# Patient Record
Sex: Female | Born: 1935 | Race: Black or African American | Hispanic: No | Smoking: Former smoker
Health system: Southern US, Community
[De-identification: ages and names within clinical notes are randomized; demographics above are authoritative.]

## PROBLEM LIST (undated history)

## (undated) DIAGNOSIS — R51 Headache: Secondary | ICD-10-CM

## (undated) DIAGNOSIS — I10 Essential (primary) hypertension: Secondary | ICD-10-CM

## (undated) DIAGNOSIS — K219 Gastro-esophageal reflux disease without esophagitis: Secondary | ICD-10-CM

## (undated) DIAGNOSIS — R42 Dizziness and giddiness: Secondary | ICD-10-CM

## (undated) DIAGNOSIS — I509 Heart failure, unspecified: Secondary | ICD-10-CM

## (undated) DIAGNOSIS — R739 Hyperglycemia, unspecified: Secondary | ICD-10-CM

## (undated) DIAGNOSIS — K279 Peptic ulcer, site unspecified, unspecified as acute or chronic, without hemorrhage or perforation: Secondary | ICD-10-CM

## (undated) DIAGNOSIS — R5383 Other fatigue: Secondary | ICD-10-CM

## (undated) DIAGNOSIS — N186 End stage renal disease: Secondary | ICD-10-CM

## (undated) DIAGNOSIS — R609 Edema, unspecified: Secondary | ICD-10-CM

## (undated) DIAGNOSIS — M1990 Unspecified osteoarthritis, unspecified site: Secondary | ICD-10-CM

## (undated) DIAGNOSIS — E119 Type 2 diabetes mellitus without complications: Secondary | ICD-10-CM

## (undated) DIAGNOSIS — Z992 Dependence on renal dialysis: Secondary | ICD-10-CM

## (undated) DIAGNOSIS — I480 Paroxysmal atrial fibrillation: Secondary | ICD-10-CM

## (undated) DIAGNOSIS — IMO0001 Reserved for inherently not codable concepts without codable children: Secondary | ICD-10-CM

## (undated) HISTORY — PX: ARTERIOVENOUS GRAFT PLACEMENT: SUR1029

## (undated) HISTORY — DX: Other fatigue: R53.83

## (undated) HISTORY — DX: Essential (primary) hypertension: I10

## (undated) HISTORY — DX: Gastro-esophageal reflux disease without esophagitis: K21.9

## (undated) HISTORY — DX: Heart failure, unspecified: I50.9

## (undated) HISTORY — DX: Unspecified osteoarthritis, unspecified site: M19.90

## (undated) HISTORY — PX: CHOLECYSTECTOMY: SHX55

## (undated) HISTORY — DX: Peptic ulcer, site unspecified, unspecified as acute or chronic, without hemorrhage or perforation: K27.9

## (undated) HISTORY — DX: Headache: R51

## (undated) HISTORY — DX: End stage renal disease: N18.6

## (undated) HISTORY — DX: Edema, unspecified: R60.9

## (undated) HISTORY — DX: Dizziness and giddiness: R42

## (undated) HISTORY — DX: Dependence on renal dialysis: Z99.2

## (undated) HISTORY — PX: OTHER SURGICAL HISTORY: SHX169

## (undated) HISTORY — PX: ABDOMINAL HYSTERECTOMY: SHX81

## (undated) HISTORY — DX: Reserved for inherently not codable concepts without codable children: IMO0001

## (undated) HISTORY — DX: Paroxysmal atrial fibrillation: I48.0

## (undated) HISTORY — PX: EYE SURGERY: SHX253

---

## 1997-11-13 ENCOUNTER — Ambulatory Visit (HOSPITAL_COMMUNITY): Admission: RE | Admit: 1997-11-13 | Discharge: 1997-11-13 | Payer: Self-pay | Admitting: Cardiology

## 1998-08-30 ENCOUNTER — Ambulatory Visit (HOSPITAL_COMMUNITY): Admission: RE | Admit: 1998-08-30 | Discharge: 1998-08-30 | Payer: Self-pay | Admitting: Cardiology

## 1998-09-06 ENCOUNTER — Encounter: Payer: Self-pay | Admitting: Cardiology

## 1998-09-06 ENCOUNTER — Ambulatory Visit (HOSPITAL_COMMUNITY): Admission: RE | Admit: 1998-09-06 | Discharge: 1998-09-06 | Payer: Self-pay | Admitting: Cardiology

## 1998-09-21 ENCOUNTER — Emergency Department (HOSPITAL_COMMUNITY): Admission: EM | Admit: 1998-09-21 | Discharge: 1998-09-21 | Payer: Self-pay | Admitting: Emergency Medicine

## 1998-09-24 ENCOUNTER — Ambulatory Visit (HOSPITAL_COMMUNITY): Admission: RE | Admit: 1998-09-24 | Discharge: 1998-09-24 | Payer: Self-pay | Admitting: Emergency Medicine

## 1999-03-23 ENCOUNTER — Encounter: Payer: Self-pay | Admitting: Emergency Medicine

## 1999-03-23 ENCOUNTER — Inpatient Hospital Stay (HOSPITAL_COMMUNITY): Admission: EM | Admit: 1999-03-23 | Discharge: 1999-03-27 | Payer: Self-pay | Admitting: Emergency Medicine

## 1999-03-26 ENCOUNTER — Encounter: Payer: Self-pay | Admitting: Cardiology

## 1999-04-02 ENCOUNTER — Encounter: Admission: RE | Admit: 1999-04-02 | Discharge: 1999-04-02 | Payer: Self-pay | Admitting: Cardiology

## 2003-08-22 ENCOUNTER — Ambulatory Visit (HOSPITAL_BASED_OUTPATIENT_CLINIC_OR_DEPARTMENT_OTHER): Admission: RE | Admit: 2003-08-22 | Discharge: 2003-08-22 | Payer: Self-pay | Admitting: Urology

## 2007-10-06 ENCOUNTER — Ambulatory Visit: Payer: Self-pay | Admitting: Cardiology

## 2007-10-06 ENCOUNTER — Ambulatory Visit: Payer: Self-pay | Admitting: Infectious Disease

## 2007-10-06 ENCOUNTER — Inpatient Hospital Stay (HOSPITAL_COMMUNITY): Admission: EM | Admit: 2007-10-06 | Discharge: 2007-10-07 | Payer: Self-pay | Admitting: Emergency Medicine

## 2007-10-07 ENCOUNTER — Encounter: Payer: Self-pay | Admitting: Infectious Disease

## 2007-10-18 ENCOUNTER — Ambulatory Visit: Payer: Self-pay

## 2008-12-21 ENCOUNTER — Inpatient Hospital Stay (HOSPITAL_COMMUNITY): Admission: EM | Admit: 2008-12-21 | Discharge: 2009-01-01 | Payer: Self-pay | Admitting: Nephrology

## 2008-12-21 ENCOUNTER — Ambulatory Visit: Payer: Self-pay | Admitting: Cardiovascular Disease

## 2008-12-24 ENCOUNTER — Ambulatory Visit: Payer: Self-pay | Admitting: Vascular Surgery

## 2008-12-25 ENCOUNTER — Encounter: Payer: Self-pay | Admitting: Family Medicine

## 2008-12-31 DIAGNOSIS — K219 Gastro-esophageal reflux disease without esophagitis: Secondary | ICD-10-CM

## 2008-12-31 DIAGNOSIS — I10 Essential (primary) hypertension: Secondary | ICD-10-CM

## 2008-12-31 DIAGNOSIS — Z9189 Other specified personal risk factors, not elsewhere classified: Secondary | ICD-10-CM | POA: Insufficient documentation

## 2008-12-31 DIAGNOSIS — J989 Respiratory disorder, unspecified: Secondary | ICD-10-CM | POA: Insufficient documentation

## 2008-12-31 DIAGNOSIS — K279 Peptic ulcer, site unspecified, unspecified as acute or chronic, without hemorrhage or perforation: Secondary | ICD-10-CM | POA: Insufficient documentation

## 2008-12-31 DIAGNOSIS — E782 Mixed hyperlipidemia: Secondary | ICD-10-CM | POA: Insufficient documentation

## 2008-12-31 DIAGNOSIS — N189 Chronic kidney disease, unspecified: Secondary | ICD-10-CM

## 2009-01-03 ENCOUNTER — Encounter: Payer: Self-pay | Admitting: Cardiovascular Disease

## 2009-01-03 LAB — CONVERTED CEMR LAB
POC INR: 6.9
Prothrombin Time: 67.4 s

## 2009-01-04 ENCOUNTER — Telehealth (INDEPENDENT_AMBULATORY_CARE_PROVIDER_SITE_OTHER): Payer: Self-pay | Admitting: *Deleted

## 2009-01-08 ENCOUNTER — Encounter: Payer: Self-pay | Admitting: Cardiology

## 2009-01-08 ENCOUNTER — Encounter (INDEPENDENT_AMBULATORY_CARE_PROVIDER_SITE_OTHER): Payer: Self-pay | Admitting: Pharmacist

## 2009-01-08 LAB — CONVERTED CEMR LAB: POC INR: 1.5

## 2009-01-16 ENCOUNTER — Ambulatory Visit (HOSPITAL_COMMUNITY): Admission: RE | Admit: 2009-01-16 | Discharge: 2009-01-16 | Payer: Self-pay | Admitting: Vascular Surgery

## 2009-01-17 ENCOUNTER — Encounter: Payer: Self-pay | Admitting: Internal Medicine

## 2009-01-17 ENCOUNTER — Encounter: Payer: Self-pay | Admitting: Cardiology

## 2009-03-20 ENCOUNTER — Inpatient Hospital Stay (HOSPITAL_COMMUNITY): Admission: EM | Admit: 2009-03-20 | Discharge: 2009-03-23 | Payer: Self-pay | Admitting: Emergency Medicine

## 2009-04-10 ENCOUNTER — Ambulatory Visit: Payer: Self-pay | Admitting: Vascular Surgery

## 2009-10-30 ENCOUNTER — Ambulatory Visit: Payer: Self-pay | Admitting: Vascular Surgery

## 2009-12-14 ENCOUNTER — Emergency Department (HOSPITAL_COMMUNITY): Admission: EM | Admit: 2009-12-14 | Discharge: 2009-12-14 | Payer: Self-pay | Admitting: Emergency Medicine

## 2009-12-18 ENCOUNTER — Ambulatory Visit (HOSPITAL_COMMUNITY): Admission: RE | Admit: 2009-12-18 | Discharge: 2009-12-18 | Payer: Self-pay | Admitting: Nephrology

## 2010-02-19 ENCOUNTER — Ambulatory Visit: Payer: Self-pay | Admitting: Vascular Surgery

## 2010-02-21 ENCOUNTER — Ambulatory Visit: Payer: Self-pay | Admitting: Vascular Surgery

## 2010-02-21 ENCOUNTER — Ambulatory Visit (HOSPITAL_COMMUNITY)
Admission: RE | Admit: 2010-02-21 | Discharge: 2010-02-21 | Payer: Self-pay | Source: Home / Self Care | Admitting: Vascular Surgery

## 2010-03-03 ENCOUNTER — Ambulatory Visit: Payer: Self-pay | Admitting: Surgery

## 2010-03-12 ENCOUNTER — Ambulatory Visit: Payer: Self-pay | Admitting: Vascular Surgery

## 2010-06-12 ENCOUNTER — Other Ambulatory Visit (HOSPITAL_COMMUNITY): Payer: Self-pay | Admitting: Nephrology

## 2010-06-12 DIAGNOSIS — N186 End stage renal disease: Secondary | ICD-10-CM

## 2010-06-13 ENCOUNTER — Ambulatory Visit (HOSPITAL_COMMUNITY)
Admission: RE | Admit: 2010-06-13 | Discharge: 2010-06-13 | Disposition: A | Discharge status: Home / Self Care | Payer: Medicare Other | Source: Ambulatory Visit | Attending: Nephrology | Admitting: Nephrology

## 2010-06-13 ENCOUNTER — Other Ambulatory Visit (HOSPITAL_COMMUNITY): Payer: Self-pay | Admitting: Nephrology

## 2010-06-13 DIAGNOSIS — Z8673 Personal history of transient ischemic attack (TIA), and cerebral infarction without residual deficits: Secondary | ICD-10-CM | POA: Insufficient documentation

## 2010-06-13 DIAGNOSIS — K219 Gastro-esophageal reflux disease without esophagitis: Secondary | ICD-10-CM | POA: Insufficient documentation

## 2010-06-13 DIAGNOSIS — N186 End stage renal disease: Secondary | ICD-10-CM

## 2010-06-13 DIAGNOSIS — M109 Gout, unspecified: Secondary | ICD-10-CM | POA: Insufficient documentation

## 2010-06-13 DIAGNOSIS — I739 Peripheral vascular disease, unspecified: Secondary | ICD-10-CM | POA: Insufficient documentation

## 2010-06-13 DIAGNOSIS — T82898A Other specified complication of vascular prosthetic devices, implants and grafts, initial encounter: Secondary | ICD-10-CM | POA: Insufficient documentation

## 2010-06-13 DIAGNOSIS — D649 Anemia, unspecified: Secondary | ICD-10-CM | POA: Insufficient documentation

## 2010-06-13 DIAGNOSIS — I120 Hypertensive chronic kidney disease with stage 5 chronic kidney disease or end stage renal disease: Secondary | ICD-10-CM | POA: Insufficient documentation

## 2010-06-13 DIAGNOSIS — E785 Hyperlipidemia, unspecified: Secondary | ICD-10-CM | POA: Insufficient documentation

## 2010-06-13 DIAGNOSIS — Y832 Surgical operation with anastomosis, bypass or graft as the cause of abnormal reaction of the patient, or of later complication, without mention of misadventure at the time of the procedure: Secondary | ICD-10-CM | POA: Insufficient documentation

## 2010-06-13 MED ORDER — IOHEXOL 300 MG/ML  SOLN
60.0000 mL | Freq: Once | INTRAMUSCULAR | Status: AC | PRN
  Administered 2010-06-13: 60 mL via INTRAVENOUS

## 2010-06-26 DIAGNOSIS — R519 Headache, unspecified: Secondary | ICD-10-CM

## 2010-06-26 HISTORY — DX: Headache, unspecified: R51.9

## 2010-07-09 LAB — GLUCOSE, CAPILLARY
Glucose-Capillary: 114 mg/dL — ABNORMAL HIGH (ref 70–99)
Glucose-Capillary: 60 mg/dL — ABNORMAL LOW (ref 70–99)

## 2010-07-09 LAB — SURGICAL PCR SCREEN
MRSA, PCR: NEGATIVE
Staphylococcus aureus: NEGATIVE

## 2010-07-11 LAB — BASIC METABOLIC PANEL
Calcium: 8.8 mg/dL (ref 8.4–10.5)
Creatinine, Ser: 11.57 mg/dL — ABNORMAL HIGH (ref 0.4–1.2)
GFR calc Af Amer: 4 mL/min — ABNORMAL LOW (ref >60)

## 2010-07-11 LAB — CBC
MCH: 28.8 pg (ref 26.0–34.0)
Platelets: 153 10*3/uL (ref 150–400)
RBC: 3.89 MIL/uL (ref 3.87–5.11)
RDW: 16.4 % — ABNORMAL HIGH (ref 11.5–15.5)
WBC: 5.8 10*3/uL (ref 4.0–10.5)

## 2010-07-11 LAB — GLUCOSE, CAPILLARY: Glucose-Capillary: 63 mg/dL — ABNORMAL LOW (ref 70–99)

## 2010-07-11 LAB — PROTIME-INR: Prothrombin Time: 14.9 seconds (ref 11.6–15.2)

## 2010-07-24 ENCOUNTER — Encounter: Payer: Self-pay | Admitting: Cardiovascular Disease

## 2010-07-24 ENCOUNTER — Ambulatory Visit (INDEPENDENT_AMBULATORY_CARE_PROVIDER_SITE_OTHER): Payer: Medicare Other | Admitting: Cardiovascular Disease

## 2010-07-24 DIAGNOSIS — R609 Edema, unspecified: Secondary | ICD-10-CM

## 2010-07-24 DIAGNOSIS — R42 Dizziness and giddiness: Secondary | ICD-10-CM

## 2010-07-24 DIAGNOSIS — I480 Paroxysmal atrial fibrillation: Secondary | ICD-10-CM

## 2010-07-24 DIAGNOSIS — I4891 Unspecified atrial fibrillation: Secondary | ICD-10-CM

## 2010-07-24 DIAGNOSIS — R0789 Other chest pain: Secondary | ICD-10-CM

## 2010-07-24 DIAGNOSIS — R079 Chest pain, unspecified: Secondary | ICD-10-CM

## 2010-07-24 HISTORY — DX: Dizziness and giddiness: R42

## 2010-07-24 HISTORY — DX: Edema, unspecified: R60.9

## 2010-07-24 NOTE — Patient Instructions (Signed)
You are scheduled for a Lexiscan at [facility] on Wednesday 07/30/10 at 8am.  Need to arrive at 7:30am to be registered.  You are to not eat or drink anything after midnight prior to the appointment.  Take your medications with you to the appointment.

## 2010-07-24 NOTE — Assessment & Plan Note (Signed)
She is currently in normal sinus rhythm. It seems that overall A-fib burden is low. She used to be on Warfarin before but was deemed to be not a good long term candidate due to memory problems and recurrent falls. Will continue with Aspirin and Plavix for now.

## 2010-07-24 NOTE — Assessment & Plan Note (Signed)
Overall, her symptoms are atypical for angina but does carry some concerning features. It seems to get worse with activities but mainly when she is using the vacuum cleaner which suggest a possible musculoskeletal etiology. Nonetheless, due to her multiple risk factors for CAD, will request a Lexiscan nuclear stress test. She is not able to exercise on treadmill.

## 2010-07-24 NOTE — Progress Notes (Signed)
HPI  [name] is a new patient to me. She is referred by Dr. Marina Goodell to evaluate her chest pain. She was seen in the past by [facility] for chest pain with negative stress testing at that time. She also has history of paroxysmal A-fib. She used to be on Warfarin but was taken off due to memory problems and falls.  She has been on dialysis for 2 years. She has history of recurrent atypical chest pain. She also reports previous history of heart failure. She went to [facility] ER due to chest pain pain which was left sided and described as aching without radiation. Mostly at rest but it did get worse with activities as well. No dyspnea or other associated symptoms although she does have fatigue especially on days of dialysis.   Allergies  Allergen Reactions  . Biaxin   . Penicillins   . Sulfa Antibiotics      No current outpatient prescriptions on file prior to visit.     Past Medical History  Diagnosis Date  . Chest pain 07/07/10  . Hypertension   . Edema 07/24/10    of ankles  . Generalized headaches 03/12  . Lightheadedness 07/24/10  . Reflux   . Fatigue   . Arthritis   . Gout   . AF (paroxysmal atrial fibrillation)     Uses to be on Warfarin in the past but was taken off due to falls.   . CHF (congestive heart failure)     Normal EF.   Marland Kitchen Chronic kidney disease   . End stage renal disease on dialysis since 2010  . Diabetes mellitus     Borderline  . Peptic ulcer disease      No past surgical history on file.   Family History  Problem Relation Age of Onset  . Cancer Father 64    cancer     History   Social History  . Marital Status: Widowed    Spouse Name: N/A    Number of Children: N/A  . Years of Education: N/A   Occupational History  . Not on file.   Social History Main Topics  . Smoking status: Former Smoker    Types: Cigarettes    Quit date: 04/28/1979  . Smokeless tobacco: Not on file  . Alcohol Use: No  . Drug Use: No  . Sexually Active:     Other Topics Concern  . Not on file   Social History Narrative  . No narrative on file     ROS Constitutional: Negative for fever, chills, diaphoresis, activity change, appetite change. Positive for fatigue.  HENT: Negative for hearing loss, nosebleeds, congestion, sore throat, facial swelling, drooling, trouble swallowing, neck pain, voice change, sinus pressure and tinnitus.  Eyes: Negative for photophobia, pain, discharge and visual disturbance.  Respiratory: Negative for apnea, cough, chest tightness, shortness of breath and wheezing.  Cardiovascular: Negative for palpitations and leg swelling.  Gastrointestinal: Negative for nausea, vomiting, abdominal pain, diarrhea, constipation, blood in stool and abdominal distention.  Genitourinary: Negative for dysuria, urgency, frequency, hematuria and decreased urine volume.  Musculoskeletal: Negative for myalgias, back pain, joint swelling, arthralgias and gait problem.  Skin: Negative for color change, pallor, rash and wound.  Neurological: Negative for dizziness, tremors, seizures, syncope, speech difficulty, weakness, light-headedness, numbness and headaches.  Psychiatric/Behavioral: Negative for suicidal ideas, hallucinations, behavioral problems and agitation. The patient is not nervous/anxious.     PHYSICAL EXAM   BP 132/79  Pulse 77  Wt 158 lb 9.6  oz (71.94 kg)  SpO2 91%   Constitutional: He is oriented to person, place, and time. He appears well-developed and well-nourished. No distress.  HENT:  Head: Normocephalic and atraumatic.  Eyes: Pupils are equal, round, and reactive to light. Right eye exhibits no discharge. Left eye exhibits no discharge.  Neck: Normal range of motion. Neck supple. No JVD present. No thyromegaly present.  Cardiovascular: Normal rate, regular rhythm, normal heart sounds and intact distal pulses. Exam reveals no gallop and no friction rub.  2/6 SEM at aortic area with preserved S2 and early  peaking.  Pulmonary/Chest: Effort normal and breath sounds normal. No stridor. No respiratory distress. He has no wheezes. He has no rales. He exhibits no tenderness.  Abdominal: Soft. Bowel sounds are normal. He exhibits no distension. There is no tenderness. There is no rebound and no guarding.  Musculoskeletal: Normal range of motion. He exhibits no edema and no tenderness.  Neurological: He is alert and oriented to person, place, and time. Coordination normal.  Skin: Skin is warm and dry. No rash noted. He is not diaphoretic. No erythema. No pallor.  Psychiatric: He has a normal mood and affect. His behavior is normal. Judgment and thought content normal.       EKG: NSR, LAD, mildly prolonged QT.    ASSESSMENT AND PLAN

## 2010-07-30 LAB — URINE MICROSCOPIC-ADD ON

## 2010-07-30 LAB — DIFFERENTIAL
Basophils Absolute: 0 10*3/uL (ref 0.0–0.1)
Eosinophils Absolute: 0.1 10*3/uL (ref 0.0–0.7)
Eosinophils Relative: 2 % (ref 0–5)
Lymphocytes Relative: 16 % (ref 12–46)
Lymphocytes Relative: 21 % (ref 12–46)
Lymphs Abs: 1.2 10*3/uL (ref 0.7–4.0)
Lymphs Abs: 1.7 10*3/uL (ref 0.7–4.0)
Monocytes Absolute: 0.8 10*3/uL (ref 0.1–1.0)
Monocytes Absolute: 1 10*3/uL (ref 0.1–1.0)
Monocytes Relative: 13 % — ABNORMAL HIGH (ref 3–12)
Monocytes Relative: 9 % (ref 3–12)
Neutro Abs: 7.9 10*3/uL — ABNORMAL HIGH (ref 1.7–7.7)

## 2010-07-30 LAB — BASIC METABOLIC PANEL
BUN: 19 mg/dL (ref 6–23)
BUN: 50 mg/dL — ABNORMAL HIGH (ref 6–23)
Calcium: 8.2 mg/dL — ABNORMAL LOW (ref 8.4–10.5)
Chloride: 101 mEq/L (ref 96–112)
Chloride: 96 mEq/L (ref 96–112)
Creatinine, Ser: 3.15 mg/dL — ABNORMAL HIGH (ref 0.4–1.2)
GFR calc Af Amer: 17 mL/min — ABNORMAL LOW (ref >60)
GFR calc Af Amer: 8 mL/min — ABNORMAL LOW (ref >60)
GFR calc non Af Amer: 14 mL/min — ABNORMAL LOW (ref >60)
Potassium: 3.3 mEq/L — ABNORMAL LOW (ref 3.5–5.1)
Sodium: 138 mEq/L (ref 135–145)

## 2010-07-30 LAB — CULTURE, BLOOD (ROUTINE X 2): Culture: NO GROWTH

## 2010-07-30 LAB — CBC
HCT: 28.5 % — ABNORMAL LOW (ref 36.0–46.0)
HCT: 30.9 % — ABNORMAL LOW (ref 36.0–46.0)
Hemoglobin: 10.3 g/dL — ABNORMAL LOW (ref 12.0–15.0)
MCHC: 34 g/dL (ref 30.0–36.0)
MCV: 87.7 fL (ref 78.0–100.0)
MCV: 88.3 fL (ref 78.0–100.0)
MCV: 89.6 fL (ref 78.0–100.0)
Platelets: 158 10*3/uL (ref 150–400)
Platelets: 173 10*3/uL (ref 150–400)
RBC: 3.45 MIL/uL — ABNORMAL LOW (ref 3.87–5.11)
RBC: 3.54 MIL/uL — ABNORMAL LOW (ref 3.87–5.11)
RDW: 16.4 % — ABNORMAL HIGH (ref 11.5–15.5)
WBC: 5.9 10*3/uL (ref 4.0–10.5)
WBC: 7.2 10*3/uL (ref 4.0–10.5)

## 2010-07-30 LAB — HEPATIC FUNCTION PANEL
ALT: 8 U/L (ref 0–35)
Alkaline Phosphatase: 73 U/L (ref 39–117)
Bilirubin, Direct: 0.2 mg/dL (ref 0.0–0.3)
Indirect Bilirubin: 0.4 mg/dL (ref 0.3–0.9)
Total Protein: 6.2 g/dL (ref 6.0–8.3)

## 2010-07-30 LAB — COMPREHENSIVE METABOLIC PANEL
AST: 34 U/L (ref 0–37)
Albumin: 2.6 g/dL — ABNORMAL LOW (ref 3.5–5.2)
BUN: 54 mg/dL — ABNORMAL HIGH (ref 6–23)
Calcium: 8.1 mg/dL — ABNORMAL LOW (ref 8.4–10.5)
Creatinine, Ser: 5.66 mg/dL — ABNORMAL HIGH (ref 0.4–1.2)
GFR calc Af Amer: 9 mL/min — ABNORMAL LOW (ref >60)
Total Bilirubin: 0.4 mg/dL (ref 0.3–1.2)
Total Protein: 5.6 g/dL — ABNORMAL LOW (ref 6.0–8.3)

## 2010-07-30 LAB — URINALYSIS, ROUTINE W REFLEX MICROSCOPIC
Ketones, ur: NEGATIVE mg/dL
Protein, ur: 30 mg/dL — AB
Urobilinogen, UA: 0.2 mg/dL (ref 0.0–1.0)

## 2010-07-30 LAB — URINE CULTURE
Colony Count: NO GROWTH
Culture: NO GROWTH

## 2010-07-30 LAB — POCT CARDIAC MARKERS
CKMB, poc: 3 ng/mL (ref 1.0–8.0)
Myoglobin, poc: 190 ng/mL (ref 12–200)

## 2010-07-30 LAB — VITAMIN B12: Vitamin B-12: 536 pg/mL (ref 211–911)

## 2010-07-30 LAB — RPR: RPR Ser Ql: NONREACTIVE

## 2010-07-30 LAB — GLUCOSE, CAPILLARY: Glucose-Capillary: 82 mg/dL (ref 70–99)

## 2010-07-30 LAB — POTASSIUM: Potassium: 3.8 mEq/L (ref 3.5–5.1)

## 2010-08-01 LAB — CBC
HCT: 32.3 % — ABNORMAL LOW (ref 36.0–46.0)
HCT: 34 % — ABNORMAL LOW (ref 36.0–46.0)
MCHC: 32.7 g/dL (ref 30.0–36.0)
MCHC: 33 g/dL (ref 30.0–36.0)
MCHC: 33.8 g/dL (ref 30.0–36.0)
MCV: 87.4 fL (ref 78.0–100.0)
MCV: 87.6 fL (ref 78.0–100.0)
MCV: 87.7 fL (ref 78.0–100.0)
MCV: 88.7 fL (ref 78.0–100.0)
Platelets: 159 10*3/uL (ref 150–400)
Platelets: 171 10*3/uL (ref 150–400)
RBC: 3.7 MIL/uL — ABNORMAL LOW (ref 3.87–5.11)
RBC: 3.88 MIL/uL (ref 3.87–5.11)
RBC: 4.17 MIL/uL (ref 3.87–5.11)
RDW: 17.1 % — ABNORMAL HIGH (ref 11.5–15.5)
RDW: 17.3 % — ABNORMAL HIGH (ref 11.5–15.5)
RDW: 18.1 % — ABNORMAL HIGH (ref 11.5–15.5)
WBC: 6.6 10*3/uL (ref 4.0–10.5)
WBC: 7.7 10*3/uL (ref 4.0–10.5)

## 2010-08-01 LAB — GLUCOSE, CAPILLARY
Glucose-Capillary: 105 mg/dL — ABNORMAL HIGH (ref 70–99)
Glucose-Capillary: 108 mg/dL — ABNORMAL HIGH (ref 70–99)
Glucose-Capillary: 112 mg/dL — ABNORMAL HIGH (ref 70–99)
Glucose-Capillary: 118 mg/dL — ABNORMAL HIGH (ref 70–99)
Glucose-Capillary: 123 mg/dL — ABNORMAL HIGH (ref 70–99)
Glucose-Capillary: 126 mg/dL — ABNORMAL HIGH (ref 70–99)
Glucose-Capillary: 128 mg/dL — ABNORMAL HIGH (ref 70–99)
Glucose-Capillary: 135 mg/dL — ABNORMAL HIGH (ref 70–99)
Glucose-Capillary: 139 mg/dL — ABNORMAL HIGH (ref 70–99)
Glucose-Capillary: 158 mg/dL — ABNORMAL HIGH (ref 70–99)
Glucose-Capillary: 165 mg/dL — ABNORMAL HIGH (ref 70–99)
Glucose-Capillary: 207 mg/dL — ABNORMAL HIGH (ref 70–99)
Glucose-Capillary: 224 mg/dL — ABNORMAL HIGH (ref 70–99)
Glucose-Capillary: 226 mg/dL — ABNORMAL HIGH (ref 70–99)

## 2010-08-01 LAB — RENAL FUNCTION PANEL
Albumin: 2 g/dL — ABNORMAL LOW (ref 3.5–5.2)
Albumin: 2.6 g/dL — ABNORMAL LOW (ref 3.5–5.2)
Albumin: 2.6 g/dL — ABNORMAL LOW (ref 3.5–5.2)
BUN: 18 mg/dL (ref 6–23)
BUN: 20 mg/dL (ref 6–23)
BUN: 51 mg/dL — ABNORMAL HIGH (ref 6–23)
CO2: 21 mEq/L (ref 19–32)
Calcium: 9.2 mg/dL (ref 8.4–10.5)
Chloride: 100 mEq/L (ref 96–112)
Chloride: 101 mEq/L (ref 96–112)
Chloride: 112 mEq/L (ref 96–112)
Chloride: 98 mEq/L (ref 96–112)
Creatinine, Ser: 3.32 mg/dL — ABNORMAL HIGH (ref 0.4–1.2)
Creatinine, Ser: 4.32 mg/dL — ABNORMAL HIGH (ref 0.4–1.2)
Creatinine, Ser: 6.76 mg/dL — ABNORMAL HIGH (ref 0.4–1.2)
GFR calc Af Amer: 16 mL/min — ABNORMAL LOW (ref >60)
GFR calc non Af Amer: 13 mL/min — ABNORMAL LOW (ref >60)
GFR calc non Af Amer: 6 mL/min — ABNORMAL LOW (ref >60)
Glucose, Bld: 120 mg/dL — ABNORMAL HIGH (ref 70–99)
Glucose, Bld: 141 mg/dL — ABNORMAL HIGH (ref 70–99)
Glucose, Bld: 66 mg/dL — ABNORMAL LOW (ref 70–99)
Phosphorus: 2.9 mg/dL (ref 2.3–4.6)
Phosphorus: 4.4 mg/dL (ref 2.3–4.6)
Potassium: 2.4 mEq/L — CL (ref 3.5–5.1)
Potassium: 3.1 mEq/L — ABNORMAL LOW (ref 3.5–5.1)
Potassium: 3.2 mEq/L — ABNORMAL LOW (ref 3.5–5.1)
Potassium: 3.5 mEq/L (ref 3.5–5.1)
Sodium: 134 mEq/L — ABNORMAL LOW (ref 135–145)

## 2010-08-01 LAB — HEMOCCULT GUIAC POC 1CARD (OFFICE): Fecal Occult Bld: NEGATIVE

## 2010-08-01 LAB — IRON AND TIBC
Iron: 44 ug/dL (ref 42–135)
Iron: 49 ug/dL (ref 42–135)

## 2010-08-01 LAB — PROTIME-INR
Prothrombin Time: 14.9 seconds (ref 11.6–15.2)
Prothrombin Time: 16.1 seconds — ABNORMAL HIGH (ref 11.6–15.2)
Prothrombin Time: 16.8 seconds — ABNORMAL HIGH (ref 11.6–15.2)

## 2010-08-01 LAB — POCT I-STAT 4, (NA,K, GLUC, HGB,HCT)
Glucose, Bld: 95 mg/dL (ref 70–99)
Sodium: 138 mEq/L (ref 135–145)

## 2010-08-02 LAB — BASIC METABOLIC PANEL
BUN: 29 mg/dL — ABNORMAL HIGH (ref 6–23)
Chloride: 100 mEq/L (ref 96–112)
Glucose, Bld: 90 mg/dL (ref 70–99)
Potassium: 4 mEq/L (ref 3.5–5.1)
Sodium: 136 mEq/L (ref 135–145)

## 2010-08-02 LAB — GLUCOSE, CAPILLARY
Glucose-Capillary: 108 mg/dL — ABNORMAL HIGH (ref 70–99)
Glucose-Capillary: 111 mg/dL — ABNORMAL HIGH (ref 70–99)
Glucose-Capillary: 113 mg/dL — ABNORMAL HIGH (ref 70–99)
Glucose-Capillary: 126 mg/dL — ABNORMAL HIGH (ref 70–99)
Glucose-Capillary: 132 mg/dL — ABNORMAL HIGH (ref 70–99)
Glucose-Capillary: 81 mg/dL (ref 70–99)
Glucose-Capillary: 98 mg/dL (ref 70–99)

## 2010-08-02 LAB — RENAL FUNCTION PANEL
BUN: 47 mg/dL — ABNORMAL HIGH (ref 6–23)
BUN: 53 mg/dL — ABNORMAL HIGH (ref 6–23)
BUN: 59 mg/dL — ABNORMAL HIGH (ref 6–23)
CO2: 17 mEq/L — ABNORMAL LOW (ref 19–32)
CO2: 18 mEq/L — ABNORMAL LOW (ref 19–32)
Chloride: 104 mEq/L (ref 96–112)
Chloride: 99 mEq/L (ref 96–112)
Creatinine, Ser: 9.34 mg/dL — ABNORMAL HIGH (ref 0.4–1.2)
Creatinine, Ser: 9.36 mg/dL — ABNORMAL HIGH (ref 0.4–1.2)
GFR calc Af Amer: 5 mL/min — ABNORMAL LOW (ref >60)
Glucose, Bld: 93 mg/dL (ref 70–99)
Glucose, Bld: 98 mg/dL (ref 70–99)
Phosphorus: 5 mg/dL — ABNORMAL HIGH (ref 2.3–4.6)
Phosphorus: 6.1 mg/dL — ABNORMAL HIGH (ref 2.3–4.6)
Potassium: 2.8 mEq/L — ABNORMAL LOW (ref 3.5–5.1)
Potassium: 2.8 mEq/L — ABNORMAL LOW (ref 3.5–5.1)
Potassium: 3.5 mEq/L (ref 3.5–5.1)

## 2010-08-02 LAB — CBC
HCT: 33.3 % — ABNORMAL LOW (ref 36.0–46.0)
HCT: 36 % (ref 36.0–46.0)
HCT: 38.8 % (ref 36.0–46.0)
Hemoglobin: 11.3 g/dL — ABNORMAL LOW (ref 12.0–15.0)
Hemoglobin: 12.1 g/dL (ref 12.0–15.0)
Hemoglobin: 13 g/dL (ref 12.0–15.0)
MCHC: 33.8 g/dL (ref 30.0–36.0)
MCV: 86.1 fL (ref 78.0–100.0)
MCV: 86.1 fL (ref 78.0–100.0)
MCV: 86.6 fL (ref 78.0–100.0)
Platelets: 226 10*3/uL (ref 150–400)
Platelets: 243 10*3/uL (ref 150–400)
RBC: 3.84 MIL/uL — ABNORMAL LOW (ref 3.87–5.11)
RBC: 4.18 MIL/uL (ref 3.87–5.11)
RDW: 16.8 % — ABNORMAL HIGH (ref 11.5–15.5)
RDW: 17.2 % — ABNORMAL HIGH (ref 11.5–15.5)
WBC: 6.9 10*3/uL (ref 4.0–10.5)
WBC: 7.7 10*3/uL (ref 4.0–10.5)

## 2010-08-02 LAB — HEPATITIS B SURFACE ANTIGEN: Hepatitis B Surface Ag: NEGATIVE

## 2010-08-02 LAB — URINALYSIS, ROUTINE W REFLEX MICROSCOPIC
Glucose, UA: NEGATIVE mg/dL
Nitrite: NEGATIVE
Protein, ur: NEGATIVE mg/dL
Urobilinogen, UA: 0.2 mg/dL (ref 0.0–1.0)

## 2010-08-02 LAB — URINE MICROSCOPIC-ADD ON

## 2010-08-02 LAB — ABO/RH: ABO/RH(D): B NEG

## 2010-08-02 LAB — TYPE AND SCREEN: Antibody Screen: NEGATIVE

## 2010-08-02 LAB — URIC ACID: Uric Acid, Serum: 8.4 mg/dL — ABNORMAL HIGH (ref 2.4–7.0)

## 2010-08-04 ENCOUNTER — Encounter: Payer: Self-pay | Admitting: Cardiovascular Disease

## 2010-08-04 ENCOUNTER — Ambulatory Visit (INDEPENDENT_AMBULATORY_CARE_PROVIDER_SITE_OTHER): Payer: Medicare Other | Admitting: Cardiovascular Disease

## 2010-08-04 DIAGNOSIS — R0789 Other chest pain: Secondary | ICD-10-CM

## 2010-08-04 DIAGNOSIS — I4891 Unspecified atrial fibrillation: Secondary | ICD-10-CM

## 2010-08-04 DIAGNOSIS — I480 Paroxysmal atrial fibrillation: Secondary | ICD-10-CM | POA: Insufficient documentation

## 2010-08-04 NOTE — Assessment & Plan Note (Signed)
Her chest pain resolved. A Lexiscan nuclear stress test showed no evidence of ischemia with normal LVSF. Her symptoms were likely GI in nature.

## 2010-08-04 NOTE — Progress Notes (Signed)
HPI  [name] is an 77 y/o female who is here for a follow up visit. She was seen recently regarding atypical chest pain. She had no more episodes since last visit. She feels better overall and has more energy.   Allergies  Allergen Reactions  . Biaxin   . Penicillins   . Sulfa Antibiotics      Current Outpatient Prescriptions on File Prior to Visit  Medication Sig Dispense Refill  . allopurinol (ZYLOPRIM) 100 MG tablet Take 100 mg by mouth daily.        Marland Kitchen aspirin 81 MG tablet Take 81 mg by mouth daily.        Marland Kitchen b complex-vitamin c-folic acid (NEPHRO-VITE) 0.8 MG TABS Take 0.8 mg by mouth at bedtime.        . calcium acetate (PHOSLO) 667 MG capsule Take 667 mg by mouth 3 (three) times daily with meals.        . clopidogrel (PLAVIX) 75 MG tablet Take 75 mg by mouth daily.        Marland Kitchen loratadine (CLARITIN) 10 MG tablet Take 10 mg by mouth daily.        . Multiple Vitamin (MULTIVITAMIN) capsule Take 1 capsule by mouth daily.        . pantoprazole (PROTONIX) 40 MG tablet Take 40 mg by mouth daily.        Marland Kitchen pyridOXINE (VITAMIN B-6) 100 MG tablet Take 100 mg by mouth daily.        . ranitidine (ZANTAC) 150 MG tablet Take 150 mg by mouth at bedtime.        . sevelamer (RENVELA) 800 MG tablet Take 800 mg by mouth 3 (three) times daily with meals.           Past Medical History  Diagnosis Date  . Chest pain 07/07/10  . Hypertension   . Edema 07/24/10    of ankles  . Generalized headaches 03/12  . Lightheadedness 07/24/10  . Reflux   . Fatigue   . Arthritis   . Gout   . Diabetes mellitus     Borderline  . Peptic ulcer disease   . AF (paroxysmal atrial fibrillation)     Uses to be on Warfarin in the past but was taken off due to falls.   . CHF (congestive heart failure)     Normal EF.   Marland Kitchen Chronic kidney disease   . End stage renal disease on dialysis since 2010     No past surgical history on file.   Family History  Problem Relation Age of Onset  . Cancer Father 34   cancer     History   Social History  . Marital Status: Widowed    Spouse Name: N/A    Number of Children: N/A  . Years of Education: N/A   Occupational History  . Not on file.   Social History Main Topics  . Smoking status: Former Smoker    Types: Cigarettes    Quit date: 04/28/1979  . Smokeless tobacco: Not on file  . Alcohol Use: No  . Drug Use: No  . Sexually Active:    Other Topics Concern  . Not on file   Social History Narrative  . No narrative on file       PHYSICAL EXAM   BP 125/70  Pulse 73  Wt 159 lb 9.6 oz (72.394 kg)  SpO2 99%  Constitutional: She is oriented to person, place, and time. She appears well-developed and  well-nourished. No distress.  HENT: No nasal discharge.  Head: Normocephalic and atraumatic.  Eyes: Pupils are equal, round, and reactive to light. Right eye exhibits no discharge. Left eye exhibits no discharge.  Neck: Normal range of motion. Neck supple. No JVD present. No thyromegaly present.  Cardiovascular: Normal rate, regular rhythm, normal heart sounds and intact distal pulses. Exam reveals no gallop and no friction rub.  There is a 1/6 SEM in aortic area. .  Pulmonary/Chest: Effort normal and breath sounds normal. No stridor. No respiratory distress. She has no wheezes. She has no rales. She exhibits no tenderness.  Abdominal: Soft. Bowel sounds are normal. She exhibits no distension. There is no tenderness. There is no rebound and no guarding.  Musculoskeletal: Normal range of motion. She exhibits no edema and no tenderness.  Neurological: She is alert and oriented to person, place, and time. Coordination normal.  Skin: Skin is warm and dry. No rash noted. She is not diaphoretic. No erythema. No pallor.  Psychiatric: She has a normal mood and affect. Her behavior is normal. Judgment and thought content normal.      ASSESSMENT AND PLAN

## 2010-08-04 NOTE — Assessment & Plan Note (Signed)
She is currently in normal sinus rhythm. It seems that overall A-fib burden is low. She used to be on Warfarin before but was deemed to be not a good long term candidate due to memory problems and recurrent falls. Will continue with Aspirin and Plavix for now.   

## 2010-09-04 ENCOUNTER — Encounter: Payer: Self-pay | Admitting: Cardiovascular Disease

## 2010-09-09 NOTE — Discharge Summary (Signed)
[name], [name]             ACCOUNT NO.:  [id number]   MEDICAL RECORD NO.:  [id number]          PATIENT TYPE:  INP   LOCATION:  4705                         FACILITY:  [facility]   PHYSICIAN:  Acey Lav, MD  DATE OF BIRTH:  10-26-1927   DATE OF ADMISSION:  10/06/2007  DATE OF DISCHARGE:  10/07/2007                               DISCHARGE SUMMARY   DISCHARGE DIAGNOSES:  1. Atypical chest pain.  2. Hypertension.  3. Hyperlipidemia.  4. Hyperglycemia.  5. Renal insufficiency.  6. History of cerebrovascular accident.  7. Chronic headaches.  8. History of gout.  9. History of gastritis.   DISCHARGE MEDICATIONS:  1. Aspirin 81 mg p.o. daily.  2. Colchicine 0.6 mg p.o. daily.  3. Calcium 600 mg two tabs p.o. daily.  4. Trileptal 300 mg half a tab p.o. b.i.d.  5. Enalapril 5 mg two tabs p.o. daily.  6. Amlodipine - valsartan combination pill at 5-320 one tablet p.o.      daily.  7. Omeprazole 20 mg p.o. daily.  8. Potassium 10 mEq p.o. daily.  9. Lasix 20 mg 1/2 a tab p.o. daily.  10.Prilosec 20 mg two tabs p.o. daily.   DISPOSITION AND FOLLOW UP:  The patient is to follow up with Dr.  Jens Som in cardiology on November 04, 2007, at 10:00 a.m. to discuss the  stress test.  That is going to be done on November 12, 2007, at 7:30 a.m.  The patient is also to follow up with Dr. Tomasa Blase, her primary care  physician, in 1-2 weeks after discharge.   CONSULTATIONS:  Cardiology was consulted secondary to the patient's  chest pain and did not felt like her chest pain was cardiac in etiology,  but given her multiple risk factors Myoview was scheduled for her in the  outpatient setting.   PROCEDURES:  No procedures were performed during this hospitalization.   HISTORY AND PHYSICAL:  The patient is a 77 year old female with a past  medical history of hypertension, hyperlipidemia, and stroke presenting  with left-sided chest pain that radiated to her right arm.  It was sharp  and  stabbing in nature that started while she was ironing the afternoon  of admission.  It was relieved with aspirin and nitroglycerin given to  her by EMS.  She had associated nausea, shortness of breath, and dyspnea  with the chest pain.  No travel history or sick contacts or pleuritic  component.  She does not have any bronchitis over the last several  months prior to admission, but this has got better recently.  She is  also complaining of left-sided headache and jaw pain that she has had  over the last 2 years.   ADMISSION VITALS:  Temperature 97.8, blood pressure 141/74, pulse 78,  respiratory rate 22, and O2 sat 98% on room air.   ADMISSION PHYSICAL EXAM:  GENERAL:  She is alert and oriented x3 in no  distress, very pleasant.  EYES:  On exam, bilateral arcus analysis.  Pupils equally round and  reactive to light.  Extraocular motions intact and EOMI.  ENT EXAM:  Moist mucous membranes.  NECK EXAM:  No lymphadenopathy, no JVD, or carotid bruits.  RESPIRATORY EXAM:  Clear to auscultation bilaterally.  Normal  respiratory effort.  Left chest wall next to sternum, very tender to  palpation.  CARDIOVASCULAR EXAM:  Regular rate and rhythm.  No murmurs, rubs, or  gallops.  ABDOMINAL EXAM:  Good bowel sounds, soft, nontender, and nondistended.  EXTREMITY EXAM:  A 1+ pitting edema bilaterally.  A 2+ dorsalis pedis  pulses.  Her right lower extremity fusion was slightly larger than her  left and no calf tenderness.  MUSCULOSKELETAL EXAM:  Strength appropriate in all extremities.  NEURO EXAM:  Cranial nerves II through XII intact.   ADMISSION LABS:  CBC white count 6.0, hemoglobin 12.1, MCV 82.9, and  platelet count 148,000.  Sodium 139, potassium 3.4, chloride 108, bicarb  23, BUN 14, creatinine 1.8, glucose 97, and BNP 297.  PT and INR of  14.1/1.1 and PTT 27.  Liver function tests:  Total bilirubin 0.8,  alkaline phosphatase 104, AST 33, ALT 24, total protein 627, albumin  3.7, and  calcium 9.7.  Cardiac enzymes all within normal limits x3.  Sed  rate 10, hemoglobin A1c 6.2, and magnesium 1.9.  Lipid profile:  Total  cholesterol 199, LDL 135, HDL 51, and TSH 0.978.  Urinalysis with trace  leukocytes and rare bacteria with few squamous epithelial cells and 0-2  WBCs.   DIAGNOSTIC IMAGING:  EKG normal sinus rhythm with a left axis deviation,  otherwise within normal limits.  Chest x-ray:  Cardiomegaly without  acute disease.  A 2-D echo showed an ejection fraction between 55% and  65% and no left ventricular regional wall motion abnormalities.  The  left ventricular wall thickness was mildly increased.  There was trivial  aortic valve repair urge and mild mitral valve repair urge.  The left  atrium was mildly dilated and the estimated peak pulmonary artery  systolic pressure was mildly increased.   HOSPITAL COURSE:  1. Chest pain.  The patient did not have any recurrent episodes of      chest pain during this hospitalization.  Cardiac enzymes were      cycles and were within normal limits.  The patient was monitored on      telemetry and did not have any events on the monitor.  Serial EKGs      were done and no significant changes.  Cardiology was consulted      secondary to the patient's multiple risk factors and plans were      made for her to have a Myoview of an outpatient.  The patient's      pain may be secondary to costochondritis given the reproducibility      of the pain on exam or may be secondary to GI etiologies and she      was instructed on how to appropriately take her Prilosec at home      daily.  2. Hypertension.  The patient's home regimen was continued.  Her blood      pressures remained stable, systolics in 130s-140s during this      hospitalization.  3. Hyperlipidemia.  Her LDL was elevated at 135; however, she was not      started on statin therapy because she noted she had multiple      medication allergies, but could not stay with these  medicines and      it was felt like it was safe to defer this to her  primary care      physician in 1-2 weeks.  4. Hyperglycemia.  Hemoglobin A1c was checked and her A1c was slightly      elevated at 6.2.  She does not have any history of diabetes and her      blood sugars on her BMET were all less than 120.  It was felt like      this could be safely followed up in the outpatient setting.  5. Renal insufficiency.  The patient's creatinine on admission was 1.8      and decreased to 1.19 at the time of discharge this shows gentle IV      fluid hydration.  It was felt like this was likely prerenal.  6. Chronic headaches.  Because the patient complained of left-sided      headaches and jaw symptoms, a sed rate was checked and was within      normal limits, so it was felt like temporal arteritis would be      unlikely in this situation and she has an appointment to follow up      with her neurologist 2 days after discharge.   DISCHARGE VITALS:  Temperature 97.1, blood pressure 153/85, pulse 66,  respiratory rate 20, and O2 sat 100% on room air.   DISCHARGE LABS:  Sodium 141, potassium 3.7, chloride 104, bicarb 29, BUN  14, creatinine 1.19, glucose 100, and calcium 9.1.  CBC:  White count  5.0, hemoglobin 1.7, and platelet count 146,000.      Joaquin Courts, MD  Electronically Signed      Acey Lav, MD  Electronically Signed    VW/MEDQ  D:  10/12/2007  T:  10/13/2007  Job:  [id number]

## 2010-09-09 NOTE — Procedures (Signed)
CEPHALIC VEIN MAPPING   INDICATION:  AV fistula placement.   HISTORY:  Renal failure.   EXAM:  The right cephalic and basilic veins are compressible.   Diameter measurements range from 0.19 to 0.10 in the cephalic vein and  0.28 to 0.11 in the basilic vein.   See attached worksheet for all measurements.   IMPRESSION:  Patent right cephalic and basilic veins with diameters as  noted above.   ___________________________________________  Janetta Hora. Fields, MD   NT/MEDQ  D:  10/30/2009  T:  10/30/2009  Job:  [id number]

## 2010-09-09 NOTE — Assessment & Plan Note (Signed)
OFFICE VISIT   [name], [name]  DOB:  09-03-1927                                       03/12/2010  UXLKG#:[id number]   I saw the patient in the office today.  The patient had a new left  forearm graft placed on 02/21/2010.  She was seen in the office on  03/03/2010 because of swelling in the arm.  She comes in for 1-week  followup visit.  The swelling has improved significantly.  Her only  complaint is some mild paresthesias in the left hand when she is on  dialysis via her catheter.   On examination, the graft has a good thrill.  Incisions have healed  nicely.  She has a diminished radial pulse on the left.  She has a  radial and palmar arch signal with the Doppler.  I think she may have a  mild steal.  I have encouraged her to exercise her hand and to let us  know if her symptoms do not resolve.     Di Kindle. Edilia Bo, M.D.  Electronically Signed   CSD/MEDQ  D:  03/12/2010  T:  03/13/2010  Job:  [id number]

## 2010-09-09 NOTE — Assessment & Plan Note (Signed)
OFFICE VISIT   [name], [name]  DOB:  10-Nov-1927                                       03/03/2010  ZOXWR#:[id number]   Patient had a left forearm Gore-Tex graft placed on 02/21/2010 by Dr.  Edilia Bo.  She has been complaining of pain and swelling in her arm and  was brought in today for evaluation.   On examination, there is no evidence of infection within the graft.  The  arm is slightly more edematous than the right.  The incisions are all  well healed.   I do not see any obvious reason for concern with this graft.  She could  have some venous stenosis, which I would not intervene on until the  graft has been in place for a month.  The patient already has an  appointment to see Dr. Edilia Bo in 1 week, and he will reevaluate it in 1  week's time and make decisions as to whether or not to proceed with  graft study.  The patient does state that her swelling is better today  than it was yesterday.  I have encouraged her to keep her arm elevated,  which should help with the swelling.     Jorge Ny, MD  Electronically Signed   VWB/MEDQ  D:  03/03/2010  T:  03/03/2010  Job:  [id number]

## 2010-09-09 NOTE — Assessment & Plan Note (Signed)
OFFICE VISIT   [name], [name]  DOB:  Aug 15, 1927                                       10/30/2009  ZOXWR#:[id number]   HISTORY OF PRESENT ILLNESS:  The patient is a 77 year old female who  previously had a left upper arm fistula placed in September of 2010.  She apparently had a stent placed in the fistula by Dr. Deanne Coffer  recently.  She returns today for further followup with poor flows to the  fistula.   The patient and her daughter were quite frustrated today that they state  that she has been having multiple procedures on her arm and they had no  idea that hemodialysis involved multiple access procedures over time.  I  had a lengthy discussion with them today that unfortunately the nature  of hemodialysis is sometimes it can take multiple procedures even in the  best of circumstances.  I reviewed her venogram today from her recent  international procedure.  This shows a patent proximal fistula.  However, she had a severe narrowing of the cephalic vein at the junction  with the subclavian vein, a several centimeter along stent was placed  across this.  The vein is severely degenerated overall.  I do not  believe that this fistula is going to be a good long-term access for  her.  It cannot be revised past the level of the stent.  In light of  this her upper extremities were reviewed for possibility of placing a  new access.  Right upper extremity has 2+ brachial and radial pulse.  The left upper extremity has a pulsatile fistula.  Blood pressure in the  right arm is 110/70 with a heart rate of 85, oxygen saturation 98%.   She had a vein mapping ultrasound today which I read, ordered and  interpreted.  This showed no usable vein for basilic vein transposition  or brachiocephalic AV fistula.   She does not take any warfarin or Coumadin anticoagulation.  She is on  Plavix.   I had a lengthy discussion with the family regarding a new access due to  the fact that the left upper arm access is going to be failing soon.  Options included placing a left arm AV graft and a temporary dialysis  catheter or the other option of placing a right upper arm AV graft with  the hopes that this would be ready for use before the left arm fistula  fails avoiding the catheter.  However, at this time the patient and her  family wish to consider whether or not peritoneal dialysis may be an  option.  They understand that the graft on the left side may occlude in  the near future and that she would require a catheter at least in the  interim period until it could be decided whether or not hemodialysis or  peritoneal dialysis was going to be her choice in the future.  They have  decided to schedule an appointment with Dr. Hyman Hopes to discuss all these  issues with him.  They will call if they wish to have a right upper arm  graft placed in the near future.  If the left arm AV fistula occludes in  the near future prior to placing a new access then we would reconsider  what her options might be.     Janetta Hora.  Fields, MD  Electronically Signed   CEF/MEDQ  D:  10/30/2009  T:  10/31/2009  Job:  [id number]

## 2010-09-09 NOTE — Assessment & Plan Note (Signed)
OFFICE VISIT   TREENA, COSMAN  DOB:  [birth year]                                       04/10/2009  [id number]#:[id number]   The patient is a 77 year old female who underwent placement of a left  brachiocephalic AV fistula by Dr. Edilia Bo on 01/16/2009.  She presented  to the office today to ask whether or not the fistula is ready for  cannulation.  She is currently dialyzing via right-sided catheter.  She  has had no problems with this.   Apparently she had some numbness and tingling in her left hand initially  after placement of her fistula but this resolved over several weeks.  She currently has no numbness, tingling or aching in her left hand.   On physical exam there is an easily palpable thrill and audible bruit in  the fistula.  The fistula is quite prominent proximally but deeper  distally.  Overall though I think the fistula is ready for cannulation  at this point.  She will follow up on an as-needed basis if they have  difficulty with the fistula.     Janetta Hora. Fields, MD  Electronically Signed   CEF/MEDQ  D:  04/10/2009  T:  04/11/2009  Job:  [id number]   cc:   [facility]

## 2010-09-09 NOTE — Op Note (Signed)
NAMEMarland Kitchen  NAIJAH, LACEK NO.:  [id number]   MEDICAL RECORD NO.:  [id number]          PATIENT TYPE:  INP   LOCATION:  6731                         FACILITY:  [facility]   PHYSICIAN:  Quita Skye. Hart Rochester, M.D.  DATE OF BIRTH:  1927-05-28   DATE OF PROCEDURE:  12/24/2008  DATE OF DISCHARGE:                               OPERATIVE REPORT   PREOPERATIVE DIAGNOSIS:  End-stage renal disease.   POSTOPERATIVE DIAGNOSIS:  End-stage renal disease.   OPERATION:  1. Bilateral ultrasound localization, internal jugular veins.  2. Insertion of Palindrome hemodialysis catheter via right internal      jugular vein (19 cm).   SURGEON:  Quita Skye. Hart Rochester, MD.   FIRST ASSISTANT:  Nurse.   ANESTHESIA:  Local.   PROCEDURE:  The patient taken to the operating room and placed in the  supine position at which time upper chest and neck were exposed, both  internal jugular veins were imaged using B-mode ultrasound.  Both noted  to be widely patent with good flow and both were photographed.  After  prepping and draping in routine sterile manner, right internal jugular  vein was entered using a supraclavicular approach.  Guidewire passed  into the right atrium under fluoroscopic guidance.  The tract was  dilated over the guidewire and a 19 cm Palindrome catheter passed  through a peel-away sheath positioned in the right atrium, tunneled  peripherally, and secured with nylon sutures.  Wound was closed with  Vicryl in subcuticular fashion.  Sterile dressing applied.  The patient  taken to recovery room in satisfactory condition.      Quita Skye Hart Rochester, M.D.  Electronically Signed     JDL/MEDQ  D:  12/24/2008  T:  12/25/2008  Job:  [id number]

## 2010-09-09 NOTE — Consult Note (Signed)
[name], [name]             ACCOUNT NO.:  [id number]   MEDICAL RECORD NO.:  [id number]          PATIENT TYPE:  INP   LOCATION:  4705                         FACILITY:  [facility]   PHYSICIAN:  Madolyn Frieze. Jens Som, MD, FACCDATE OF BIRTH:  May 25, 1927   DATE OF CONSULTATION:  10/07/2007  DATE OF DISCHARGE:  10/07/2007                                 CONSULTATION   [name] is a 77 year old female with past medical history of  hypertension, borderline diabetes mellitus, renal insufficiency, prior  CVA, gastroesophageal reflux disease, peptic ulcer disease, and I am  asked to evaluate for chest pain.  The patient does apparently have a  history of heart failure.  I do not have those records available.  She  has a history of mild dyspnea on exertion and pedal edema, but there is  no orthopnea or PND and she does not have a history of exertional chest  pain.  Yesterday, while ironing, she had substernal chest pain described  as stabbing.  There was no associated shortness of breath or  diaphoresis, but there was nausea.  The pain radiated to her right upper  extremity and it lasted for approximately 30 minutes.  It resolved  spontaneously.  The pain was not pleuritic, positional, nor was it  related to food.  She has had no pain since then.  She was admitted to  rule out myocardial infarction and cardiology was asked to further  evaluate.   MEDICATIONS:  1. Nitroglycerin paste.  2. Lopressor 12.5 mg p.o. b.i.d.  3. Aspirin 325 mg p.o. daily.  4. Protonix 40 mg p.o. daily.  5. Crestor 40 mg p.o. daily.  6. Enalapril 10 mg p.o. daily.  7. Enoxaparin 40 mg subcu daily.   ALLERGIES:  She has an allergy to PENICILLIN and SULFA.   SOCIAL HISTORY:  She does not smoke nor does she consume alcohol.   FAMILY HISTORY:  Negative for coronary artery disease in her immediate  family.   PAST MEDICAL HISTORY:  Significant for hypertension as well as  borderline diabetes mellitus.  She  states her cholesterol has been  elevated in the past but not recently.  She has a prior CVA.  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  She has  a history of mild renal insufficiency.  She has peptic ulcer disease,  gastroesophageal reflux disease, and gastritis.  She has had a prior  hysterectomy.  She has had a tubal ligation prior to the hysterectomy.  She has osteoarthritis.  She has history of headaches.  She also has  gout.   REVIEW OF SYSTEMS:  She denies any headaches, fevers, or chills.  There  is no productive cough or hemoptysis.  There is no dysphagia,  odynophagia, melena, or hematochezia.  There is no dysuria or hematuria.  There is no rash or seizure activity.  There is no orthopnea, PND, but  there can occasionally be mild pedal edema.  Remaining systems are  negative.   PHYSICAL EXAMINATION:  VITAL SIGNS:  Blood pressure 153/85 and pulse is  66.  She is afebrile.  GENERAL:  She is well developed, well nourished, and in no acute  distress.  SKIN:  Warm and dry.  She does not appear to be depressed.  There is no  peripheral clubbing.  BACK:  Normal.  HEENT:  Normal with normal eyelids.  NECK:  Supple with normal upstrokes bilaterally.  No bruits noted.  There is no jugular distention and I cannot appreciate thyromegaly.  CHEST:  Clear to auscultation with normal expansion.  CARDIOVASCULAR:  Regular rhythm with normal S1 and S2.  There were no  murmurs, rubs, or gallops noted.  ABDOMEN:  Nontender and nondistended.  Positive bowel sounds.  No  hepatosplenomegaly.  No masses appreciated.  There was no abdominal  bruit.  She has 2+ femoral pulses bilaterally with no bruits.  EXTREMITIES:  Show no edema and I could palpate no cords.  She has 2+  dorsalis pedis pulses bilaterally.  Neurologic:  Grossly intact.   LABORATORY DATA:  Negative cardiac markers.  Her TSH was normal.  Her  LDL is 135.  Her BUN and creatinine are 14 and 1.19  respectively.  Her  potassium is 3.7.  Her hemoglobin/hematocrit are 11.7 and 34.6.  Her  platelet count is 146.  Her liver functions were normal.  A BNP on  admission was 297.  Chest x-ray showed cardiomegaly without acute  disease.  Her electrocardiogram shows a sinus rhythm at a rate of 63.  There is left axis deviation.  There are minor nonspecific ST changes.   DIAGNOSES:  1. Atypical chest pain - [name]'s symptoms are atypical.  She      is ruled out for myocardial infarction and her EKG is unremarkable.      I think we can allow her to be discharged and proceed with an      outpatient stress test for risk stratification.  We will arrange      this and I will see back in approximately 2 weeks to review this      with her.  Some of her pain may be musculoskeletal in etiology.  2. Hypertension - she will continue on her present blood pressure      medications.  These can be adjusted by her primary care physician      as needed to control blood pressure.  3. Hyperlipidemia - she would benefit from a statin given her multiple      risk factors.  I will leave this to the primary care service.  4. History of borderline diabetes mellitus - management per primary      care.  5. History of mild renal insufficiency.   Thank you for allowing [place] to consult on this nice lady.  Please call us  with any further questions.      Madolyn Frieze Jens Som, MD, [facility]  Electronically Signed     BSC/MEDQ  D:  10/07/2007  T:  10/08/2007  Job:  [id number]

## 2010-09-09 NOTE — Assessment & Plan Note (Signed)
OFFICE VISIT   DARLIS, WRAGG  DOB:  Jan 25, 1928                                       02/19/2010  EAVWU#:[id number]   I saw Ms. Bassette in the office to evaluate for a new access.  She had  a left upper arm fistula placed in September 2010.  This had been used  for awhile and then most recently she was seen in July 2011 by Dr.  Darrick Penna after she had a stent placed in the fistula by interventional  radiology.  She has some degeneration throughout the graft and it was  felt she was not a good candidate to salvage the fistula in any way.  In  addition, it could not be revised above the stent.  Likewise her basilic  vein was quite small on the left and vein mapping on the right side  showed that her forearm and upper arm cephalic vein and her basilic vein  not usable for a fistula.  He had recommended an AV graft at that time,  however, they were not agreeable.   The patient returns to discuss access.  Of note, she has had no recent  uremic symptoms.  Specifically she denies anorexia, nausea, vomiting,  palpitations, shortness of breath.   REVIEW OF SYSTEMS:  CARDIOVASCULAR:  She has had no chest pain, chest  pressure, palpitations or arrhythmias.   PHYSICAL EXAMINATION:  This a pleasant 77 year old woman who appears  stated age.  Blood pressure 111/78, heart rate is 92, temperature 98.2.  LUNGS:  Clear bilaterally to auscultation.  CARDIAC:  She has a regular rate and rhythm.  I cannot palpate a thrill or bruit in her left upper arm fistula.  She  has a functioning dialysis catheter in her right chest.  There is a  palpable brachial and radial pulse bilaterally.   I have recommend we explore her antecubital vessels on the left and if  these are adequate place a forearm graft.  If these are not adequate  place an upper arm graft.  We have discussed the indications for surgery  and its potential complications including not limited to the graft  thrombosis, steal syndrome, graft infection, arm swelling, wound healing  problems and bleeding.  All of her questions were answered.  She is  agreeable to proceed.  She is scheduled for surgery on February 21, 2010.  She had a venogram subsequently which showed narrowing of cephalic vein  at the.     Di Kindle. Edilia Bo, M.D.  Electronically Signed   CSD/MEDQ  D:  02/19/2010  T:  02/20/2010  Job:  [id number]   cc:   [facility]

## 2010-09-12 NOTE — Op Note (Signed)
NAME:  [name], [name]                     ACCOUNT NO.:  [id number]   MEDICAL RECORD NO.:  [id number]                   PATIENT TYPE:  AMB   LOCATION:  [facility]C                                 FACILITY:  [facility]   PHYSICIAN:  Valetta Fuller, M.D.               DATE OF BIRTH:  May 02, 1927   DATE OF PROCEDURE:  DATE OF DISCHARGE:                                 OPERATIVE REPORT   PREOPERATIVE DIAGNOSIS:  1. Nonspecific right flank pain.  2. Possible right mid ureteral calculus.   POSTOPERATIVE DIAGNOSIS:  1. Nonspecific right flank pain.  2. No ureteral stone seen.   PROCEDURE PERFORMED:  Cystoscopy, retrograde pyelography of the right  ureter, right ureteroscopy.   SURGEON:  Valetta Fuller, M.D.   ANESTHESIA:  General.   INDICATIONS:  Ms. Kueker is a 76 year old female.  The patient began  having some right flank pain approximately six months ago.  A CT scan done  at that time at [facility] apparently showed a 4 mm stone in her  right ureter.  There was no evidence of hydronephrosis at that time.  I had  that report, but it did not have those x-rays.  Apparently, she underwent  some type of procedure by one of the urologists in [place].  He apparently  told the patient that he got a stone, but there is some lack of  understanding, on either the patient's part or in the communication as to  whether indeed a stone was found at that time or not.  The CT was done in  October, 2004, and there was at least radiographic evidence of a 4 mm right  ureteral calculus, but there was no evidence of hydronephrosis or  hydroureter.  The patient felt better for a period of time but then  developed recurrent flank pain.  A CT scan was once again performed at  [facility].  At this time, a calcification measuring 6-7 mm was noted  in the area of the right mid ureter.  Again, there was absolutely no  evidence of dilation.  The radiologist was somewhat uncertain was to whether  the stone was truly in the ureter or potentially adjacent to it.  The  patient was sent back to Dr. Dixon Boos.  I did not have any notes from the  urologist in [place], but according to notes from [facility], he did some additional tests and was uncertain whether this  calcification was truly in her ureter or not.  He apparently wanted to go  ahead with retrograde pyelography to really definitively determine what was  going on, but the patient wanted a second opinion.  She came to see me  recently.  She did have her most recent CT scan on disk, which I reviewed.  There was certainly a suspicious 6-7 mm calcification in the area of the  right mid ureter, but again, there really was no evidence of dilatation,  hydronephrosis, etc.  It was certainly really very difficult, if not  impossible to tell whether this calcification was truly in the ureter or  potentially adjacent to the ureter, in the gonal vein or even potentially a  calcification that had perforated the ureter and was now lying next to it  rather than actually within the ureter.  I gave her several options, which  included getting all of her x-rays and reports from the previous urologist  and reviewing everything or potentially going ahead with definitive  evaluation.  She elected to proceed with definitive evaluation,  understanding that a stone may not be encountered.   TECHNIQUE/FINDINGS:  Patient was brought to the operating room where she had  successful induction of general anesthesia.  She was placed in the lithotomy  position and prepped and draped in the usual manner.  Cystoscopy was totally  unremarkable with a normal-appearing bladder.  A retrograde pyelogram on the  right showed a very delicate and unremarkable ureter.  There was no evidence  of dilation, and the collecting system was quite delicate, and again,  without evidence of dilation.  A guidewire was placed up to the renal pelvis  with  absolutely no difficulty.  I really wanted to see her mid ureter to see  if there was evidence that potentially a stone had migrated outside the  ureter, and maybe we would see some evidence of ureteral wall or even  potentially a portion of a calcification emanating out of the side of the  ureter.  For that reason, I performed direct visual ureteroscopy, utilizing  a mini-uteroscope.  There was no need for ureteral dilation, and the distal  ureter was easily engaged.  The entire ureter from the ureterovesical  junction to the ureteropelvic junction was carefully endoscoped.  I saw  absolutely no evidence of any calculus.  The ureteral mucosa all appeared  quite normal, and there was no evidence of inflammation.  I suspect that  this calcification is in an adjacent gonadal vein.  The patient appeared to  tolerate the procedure well.  There were no obvious complications.                                               Valetta Fuller, M.D.    DSG/MEDQ  D:  08/22/2003  T:  08/22/2003  Job:  [id number]   cc:   Lonie Peak, PA  [facility]

## 2011-01-22 LAB — PROTIME-INR: Prothrombin Time: 14.1

## 2011-01-22 LAB — LIPID PANEL
Cholesterol: 199
HDL: 51
LDL Cholesterol: 135 — ABNORMAL HIGH
Triglycerides: 65

## 2011-01-22 LAB — DIFFERENTIAL
Basophils Relative: 0
Eosinophils Absolute: 0.1
Monocytes Relative: 11
Neutro Abs: 4.4
Neutrophils Relative %: 74

## 2011-01-22 LAB — URINE MICROSCOPIC-ADD ON

## 2011-01-22 LAB — POCT I-STAT, CHEM 8
Chloride: 108
Glucose, Bld: 97
HCT: 36
Hemoglobin: 12.2
Potassium: 3.4 — ABNORMAL LOW
Sodium: 139

## 2011-01-22 LAB — BASIC METABOLIC PANEL
CO2: 29
Chloride: 104
GFR calc Af Amer: 53 — ABNORMAL LOW
Potassium: 3.7

## 2011-01-22 LAB — POCT CARDIAC MARKERS
CKMB, poc: 1.4
Myoglobin, poc: 133
Operator id: 275321
Troponin i, poc: <0.05

## 2011-01-22 LAB — CBC
MCHC: 34.3
MCV: 82.9
Platelets: 146 — ABNORMAL LOW
Platelets: 148 — ABNORMAL LOW
RBC: 4.17
RBC: 4.27
WBC: 5
WBC: 6

## 2011-01-22 LAB — SEDIMENTATION RATE: Sed Rate: 10

## 2011-01-22 LAB — CARDIAC PANEL(CRET KIN+CKTOT+MB+TROPI)
Relative Index: 1.3
Relative Index: 2.2
Total CK: 118
Total CK: 179 — ABNORMAL HIGH
Troponin I: 0.01
Troponin I: <0.01

## 2011-01-22 LAB — URINALYSIS, ROUTINE W REFLEX MICROSCOPIC
Bilirubin Urine: NEGATIVE
Glucose, UA: NEGATIVE
Hgb urine dipstick: NEGATIVE
Ketones, ur: NEGATIVE
pH: 7.5

## 2011-01-22 LAB — COMPREHENSIVE METABOLIC PANEL
ALT: 24
AST: 33
CO2: 27
Calcium: 9.7
Chloride: 106
Creatinine, Ser: 1.35 — ABNORMAL HIGH
GFR calc Af Amer: 46 — ABNORMAL LOW
GFR calc non Af Amer: 38 — ABNORMAL LOW
Glucose, Bld: 111 — ABNORMAL HIGH
Total Bilirubin: 0.8

## 2011-01-22 LAB — MAGNESIUM: Magnesium: 1.9

## 2011-01-22 LAB — HEMOGLOBIN A1C: Mean Plasma Glucose: 143

## 2011-01-22 LAB — TSH: TSH: 0.978

## 2012-07-16 ENCOUNTER — Other Ambulatory Visit: Payer: Self-pay | Admitting: Physician Assistant

## 2012-07-16 ENCOUNTER — Encounter (HOSPITAL_COMMUNITY): Payer: Self-pay | Admitting: Internal Medicine

## 2012-07-16 ENCOUNTER — Observation Stay (HOSPITAL_COMMUNITY)
Admission: AD | Admit: 2012-07-16 | Discharge: 2012-07-16 | Disposition: A | Discharge status: Home / Self Care | Payer: Medicare Other | Source: Other Acute Inpatient Hospital | Attending: Internal Medicine | Admitting: Internal Medicine

## 2012-07-16 DIAGNOSIS — Z9189 Other specified personal risk factors, not elsewhere classified: Secondary | ICD-10-CM

## 2012-07-16 DIAGNOSIS — I480 Paroxysmal atrial fibrillation: Secondary | ICD-10-CM

## 2012-07-16 DIAGNOSIS — K279 Peptic ulcer, site unspecified, unspecified as acute or chronic, without hemorrhage or perforation: Secondary | ICD-10-CM

## 2012-07-16 DIAGNOSIS — J989 Respiratory disorder, unspecified: Secondary | ICD-10-CM

## 2012-07-16 DIAGNOSIS — E119 Type 2 diabetes mellitus without complications: Secondary | ICD-10-CM | POA: Insufficient documentation

## 2012-07-16 DIAGNOSIS — N189 Chronic kidney disease, unspecified: Secondary | ICD-10-CM | POA: Diagnosis present

## 2012-07-16 DIAGNOSIS — R079 Chest pain, unspecified: Secondary | ICD-10-CM

## 2012-07-16 DIAGNOSIS — R072 Precordial pain: Principal | ICD-10-CM | POA: Insufficient documentation

## 2012-07-16 DIAGNOSIS — I4891 Unspecified atrial fibrillation: Secondary | ICD-10-CM | POA: Insufficient documentation

## 2012-07-16 DIAGNOSIS — R0789 Other chest pain: Secondary | ICD-10-CM

## 2012-07-16 DIAGNOSIS — N2581 Secondary hyperparathyroidism of renal origin: Secondary | ICD-10-CM | POA: Insufficient documentation

## 2012-07-16 DIAGNOSIS — N186 End stage renal disease: Secondary | ICD-10-CM | POA: Insufficient documentation

## 2012-07-16 DIAGNOSIS — D649 Anemia, unspecified: Secondary | ICD-10-CM | POA: Insufficient documentation

## 2012-07-16 DIAGNOSIS — I120 Hypertensive chronic kidney disease with stage 5 chronic kidney disease or end stage renal disease: Secondary | ICD-10-CM | POA: Insufficient documentation

## 2012-07-16 DIAGNOSIS — I10 Essential (primary) hypertension: Secondary | ICD-10-CM

## 2012-07-16 DIAGNOSIS — K219 Gastro-esophageal reflux disease without esophagitis: Secondary | ICD-10-CM

## 2012-07-16 DIAGNOSIS — E782 Mixed hyperlipidemia: Secondary | ICD-10-CM

## 2012-07-16 DIAGNOSIS — Z992 Dependence on renal dialysis: Secondary | ICD-10-CM | POA: Insufficient documentation

## 2012-07-16 HISTORY — DX: Hyperglycemia, unspecified: R73.9

## 2012-07-16 LAB — RENAL FUNCTION PANEL
Albumin: 3.2 g/dL — ABNORMAL LOW (ref 3.5–5.2)
CO2: 25 mEq/L (ref 19–32)
Calcium: 10.1 mg/dL (ref 8.4–10.5)
Calcium: 9.9 mg/dL (ref 8.4–10.5)
Chloride: 98 mEq/L (ref 96–112)
GFR calc Af Amer: 4 mL/min — ABNORMAL LOW (ref >90)
GFR calc Af Amer: 4 mL/min — ABNORMAL LOW (ref >90)
GFR calc non Af Amer: 4 mL/min — ABNORMAL LOW (ref >90)
GFR calc non Af Amer: 4 mL/min — ABNORMAL LOW (ref >90)
Phosphorus: 6.3 mg/dL — ABNORMAL HIGH (ref 2.3–4.6)
Potassium: 3.8 mEq/L (ref 3.5–5.1)
Sodium: 138 mEq/L (ref 135–145)
Sodium: 140 mEq/L (ref 135–145)

## 2012-07-16 LAB — CBC
HCT: 34.7 % — ABNORMAL LOW (ref 36.0–46.0)
MCHC: 32.9 g/dL (ref 30.0–36.0)
MCV: 94.3 fL (ref 78.0–100.0)
RDW: 15.7 % — ABNORMAL HIGH (ref 11.5–15.5)

## 2012-07-16 LAB — TROPONIN I: Troponin I: <0.3 ng/mL (ref <0.30)

## 2012-07-16 LAB — GLUCOSE, CAPILLARY: Glucose-Capillary: 66 mg/dL — ABNORMAL LOW (ref 70–99)

## 2012-07-16 LAB — CREATININE, SERUM: GFR calc Af Amer: 4 mL/min — ABNORMAL LOW (ref >90)

## 2012-07-16 MED ORDER — CALCIUM ACETATE 667 MG PO CAPS
667.0000 mg | ORAL_CAPSULE | Freq: Three times a day (TID) | ORAL | Status: DC
  Administered 2012-07-16 (×2): 667 mg via ORAL
  Filled 2012-07-16 (×4): qty 1

## 2012-07-16 MED ORDER — SEVELAMER CARBONATE 800 MG PO TABS
800.0000 mg | ORAL_TABLET | Freq: Three times a day (TID) | ORAL | Status: DC
  Administered 2012-07-16 (×2): 800 mg via ORAL
  Filled 2012-07-16 (×4): qty 1

## 2012-07-16 MED ORDER — DOXERCALCIFEROL 4 MCG/2ML IV SOLN
6.0000 ug | INTRAVENOUS | Status: DC
  Administered 2012-07-16: 6 ug via INTRAVENOUS
  Filled 2012-07-16: qty 4

## 2012-07-16 MED ORDER — HEPARIN SODIUM (PORCINE) 1000 UNIT/ML DIALYSIS
20.0000 [IU]/kg | INTRAMUSCULAR | Status: DC | PRN
  Filled 2012-07-16: qty 2

## 2012-07-16 MED ORDER — VITAMIN B-6 100 MG PO TABS
100.0000 mg | ORAL_TABLET | Freq: Every day | ORAL | Status: DC
  Administered 2012-07-16: 100 mg via ORAL
  Filled 2012-07-16: qty 1

## 2012-07-16 MED ORDER — ONDANSETRON HCL 4 MG/2ML IJ SOLN: 4.0000 mg | Freq: Four times a day (QID) | INTRAMUSCULAR | Status: DC | PRN

## 2012-07-16 MED ORDER — RENA-VITE PO TABS
1.0000 | ORAL_TABLET | Freq: Every day | ORAL | Status: DC
  Administered 2012-07-16: 1 via ORAL
  Filled 2012-07-16 (×2): qty 1

## 2012-07-16 MED ORDER — PENTAFLUOROPROP-TETRAFLUOROETH EX AERO: 1.0000 "application " | INHALATION_SPRAY | CUTANEOUS | Status: DC | PRN

## 2012-07-16 MED ORDER — NITROGLYCERIN 0.4 MG SL SUBL: 0.4000 mg | SUBLINGUAL_TABLET | SUBLINGUAL | Status: DC | PRN

## 2012-07-16 MED ORDER — ALLOPURINOL 100 MG PO TABS
100.0000 mg | ORAL_TABLET | Freq: Every day | ORAL | Status: DC
  Administered 2012-07-16: 100 mg via ORAL
  Filled 2012-07-16: qty 1

## 2012-07-16 MED ORDER — ALTEPLASE 2 MG IJ SOLR
2.0000 mg | Freq: Once | INTRAMUSCULAR | Status: DC | PRN
  Filled 2012-07-16: qty 2

## 2012-07-16 MED ORDER — CLOPIDOGREL BISULFATE 75 MG PO TABS
75.0000 mg | ORAL_TABLET | Freq: Every day | ORAL | Status: DC
  Administered 2012-07-16: 75 mg via ORAL
  Filled 2012-07-16 (×2): qty 1

## 2012-07-16 MED ORDER — LIDOCAINE HCL (PF) 1 % IJ SOLN: 5.0000 mL | INTRAMUSCULAR | Status: DC | PRN

## 2012-07-16 MED ORDER — HEPARIN SODIUM (PORCINE) 1000 UNIT/ML DIALYSIS
1000.0000 [IU] | INTRAMUSCULAR | Status: DC | PRN
  Filled 2012-07-16: qty 1

## 2012-07-16 MED ORDER — ONDANSETRON HCL 4 MG PO TABS: 4.0000 mg | ORAL_TABLET | Freq: Four times a day (QID) | ORAL | Status: DC | PRN

## 2012-07-16 MED ORDER — PANTOPRAZOLE SODIUM 40 MG PO TBEC
40.0000 mg | DELAYED_RELEASE_TABLET | Freq: Every day | ORAL | Status: DC
  Administered 2012-07-16: 40 mg via ORAL
  Filled 2012-07-16: qty 1

## 2012-07-16 MED ORDER — SODIUM CHLORIDE 0.9 % IJ SOLN: 3.0000 mL | Freq: Two times a day (BID) | INTRAMUSCULAR | Status: DC

## 2012-07-16 MED ORDER — LIDOCAINE-PRILOCAINE 2.5-2.5 % EX CREA: 1.0000 "application " | TOPICAL_CREAM | CUTANEOUS | Status: DC | PRN

## 2012-07-16 MED ORDER — SODIUM CHLORIDE 0.9 % IV SOLN: 250.0000 mL | INTRAVENOUS | Status: DC | PRN

## 2012-07-16 MED ORDER — HEPARIN SODIUM (PORCINE) 5000 UNIT/ML IJ SOLN
5000.0000 [IU] | Freq: Three times a day (TID) | INTRAMUSCULAR | Status: DC
  Filled 2012-07-16 (×3): qty 1

## 2012-07-16 MED ORDER — DOXERCALCIFEROL 4 MCG/2ML IV SOLN
INTRAVENOUS | Status: AC
  Filled 2012-07-16: qty 4

## 2012-07-16 MED ORDER — SODIUM CHLORIDE 0.9 % IV SOLN: 100.0000 mL | INTRAVENOUS | Status: DC | PRN

## 2012-07-16 MED ORDER — ASPIRIN EC 81 MG PO TBEC
81.0000 mg | DELAYED_RELEASE_TABLET | Freq: Every day | ORAL | Status: DC
  Administered 2012-07-16: 81 mg via ORAL
  Filled 2012-07-16: qty 1

## 2012-07-16 MED ORDER — NEPRO/CARBSTEADY PO LIQD: 237.0000 mL | ORAL | Status: DC | PRN

## 2012-07-16 MED ORDER — DOCUSATE SODIUM 100 MG PO CAPS
100.0000 mg | ORAL_CAPSULE | Freq: Two times a day (BID) | ORAL | Status: DC
  Administered 2012-07-16: 100 mg via ORAL
  Filled 2012-07-16 (×2): qty 1

## 2012-07-16 MED ORDER — LORATADINE 10 MG PO TABS
10.0000 mg | ORAL_TABLET | Freq: Every day | ORAL | Status: DC
  Administered 2012-07-16: 10 mg via ORAL
  Filled 2012-07-16: qty 1

## 2012-07-16 MED ORDER — MORPHINE SULFATE 2 MG/ML IJ SOLN: 1.0000 mg | INTRAMUSCULAR | Status: DC | PRN

## 2012-07-16 MED ORDER — SODIUM CHLORIDE 0.9 % IJ SOLN: 3.0000 mL | INTRAMUSCULAR | Status: DC | PRN

## 2012-07-16 NOTE — Consult Note (Signed)
CARDIOLOGY CONSULT NOTE  Patient ID: [name], MRN: [id number], DOB/AGE: January 17, 1928 79 y.o. Admit date: 07/16/2012   Date of Consult: 07/16/2012 Primary Physician: No primary provider on file. Primary Cardiologist: [facility] office when it was open)  Chief Complaint: chest pain Reason for Consult: chest pain  HPI: [name] is an 79 y/o F with no prior CAD but a history of ESRD following Vioxx usage, afib (previously not deemed a Coumadin candidate, taken off by nephrology per notes 2010, maintained on ASA/Plavix), very remote CHF (pt reports ~30 yrs ago, normal EF) who presented initially to [facility] with chest pain after eating a pork chop. She was in her usual state of health yesterday. After finishing a whole pork chop, she developed a sharp discrete pain in the L chest. It did not radiate. It was not associated with diaphoresis, nausea, SOB, palpitations or syncope. She took 4 baby ASA but it did not resolve so called EMS (has had 1 other episode a few months ago after eating a meal that as relieved with ASA). By EMS she was given 1 SL NTG with very gradual relief of pain, which lasted 20 minutes in total. EKG NSR with PAC at [facility], nonspecific changes. Initial vitals P73, BP 155/65.  She was transferred to [facility] due to dialysis availability. Pain has not recurred. Troponin neg x 1 at [facility] then neg x 2 here. CXR showed stable enlarged heart with ectatic aorta, no acute disease. She does not exercise, but does light housework without any CP or SOB. She has an aide that comes in and helps with heavier housework due to general weakness. Denies syncope, LEE, orthopnea, weight changes, bleeding, or any other problems. She denies history of stroke, DM (borderline in past).  Past Medical History  Diagnosis Date  . Chest pain 07/07/10  . Hypertension   . Edema 07/24/10    of ankles  . Generalized headaches 03/12  . Lightheadedness 07/24/10  . Reflux    . Fatigue   . Arthritis   . Gout   . Diabetes mellitus     Borderline  . Peptic ulcer disease   . AF (paroxysmal atrial fibrillation)     Used to be on Warfarin in the past but was taken off due to falls, memory problems.  . CHF (congestive heart failure)     30 years ago  . End stage renal disease on dialysis since 2010    related to Vioxx      Most Recent Cardiac Studies: Rest Stress Thallium Study 07/2010 No evidence of ischemia or scar. Normal wall motion and contractility 78%  2D Echo 09/2007 SUMMARY - Overall left ventricular systolic function was normal. Left ventricular ejection fraction was estimated , range being 55 % to 65 %. There were no left ventricular regional wall motion abnormalities. Left ventricular wall thickness was mildly increased. - There was trivial aortic valvular regurgitation. - There was mild mitral valvular regurgitation. - The left atrium was mildly dilated. - The estimated peak pulmonary artery systolic pressure was mildly increased.   Surgical History:  Past Surgical History  Procedure Laterality Date  . Hemodialysis catheter      & Placement left forearm AV graft      Home Meds: Prior to Admission medications   Medication Sig Start Date End Date Taking? Authorizing Provider  allopurinol (ZYLOPRIM) 100 MG tablet Take 100 mg by mouth daily.     Yes Historical Provider, MD  aspirin 81 MG  tablet Take 81 mg by mouth daily.     Yes Historical Provider, MD  b complex-vitamin c-folic acid (NEPHRO-VITE) 0.8 MG TABS Take 0.8 mg by mouth at bedtime.     Yes Historical Provider, MD  calcium acetate (PHOSLO) 667 MG capsule Take 667 mg by mouth 3 (three) times daily with meals.     Yes Historical Provider, MD  cephALEXin (KEFLEX) 250 MG capsule Take 250 mg by mouth 2 (two) times daily. Take for 7 days. First dose on 07/11/12   Yes Historical Provider, MD  clopidogrel (PLAVIX) 75 MG tablet Take 75 mg by mouth daily.     Yes Historical Provider, MD    Multiple Vitamin (MULTIVITAMIN) capsule Take 1 capsule by mouth daily.     Yes Historical Provider, MD  pantoprazole (PROTONIX) 40 MG tablet Take 40 mg by mouth daily.     Yes Historical Provider, MD  pyridOXINE (VITAMIN B-6) 100 MG tablet Take 100 mg by mouth daily.     Yes Historical Provider, MD  ranitidine (ZANTAC) 150 MG tablet Take 150 mg by mouth at bedtime.     Yes Historical Provider, MD  sevelamer (RENVELA) 800 MG tablet Take 800 mg by mouth 3 (three) times daily with meals.     Yes Historical Provider, MD    Inpatient Medications:  . allopurinol  100 mg Oral Daily  . aspirin EC  81 mg Oral Daily  . calcium acetate  667 mg Oral TID WC  . clopidogrel  75 mg Oral Q breakfast  . docusate sodium  100 mg Oral BID  . doxercalciferol  6 mcg Intravenous Q T,Th,Sa-HD  . heparin  5,000 Units Subcutaneous Q8H  . loratadine  10 mg Oral Daily  . multivitamin  1 tablet Oral QHS  . pantoprazole  40 mg Oral Daily  . pyridOXINE  100 mg Oral Daily  . sevelamer carbonate  800 mg Oral TID WC  . sodium chloride  3 mL Intravenous Q12H  . sodium chloride  3 mL Intravenous Q12H    Allergies:  Allergies  Allergen Reactions  . Clarithromycin   . Penicillins   . Sulfa Antibiotics     History   Social History  . Marital Status: Widowed    Spouse Name: N/A    Number of Children: N/A  . Years of Education: N/A   Occupational History  . Not on file.   Social History Main Topics  . Smoking status: Former Smoker    Types: Cigarettes    Quit date: 04/28/1979  . Smokeless tobacco: Not on file     Comment: rare - 1-2x/week, quit 30 yrs ago  . Alcohol Use: No  . Drug Use: No  . Sexually Active: Not on file   Other Topics Concern  . Not on file   Social History Narrative  . No narrative on file     Family History  Problem Relation Age of Onset  . Cancer Father 13    cancer  . Other Mother     mother died from burns  . Kidney disease Sister   . CAD Neg Hx      Review of  Systems: General: negative for chills, fever, night sweats or weight changes.  Cardiovascular: see above Dermatological: negative for rash Respiratory: negative for cough or wheezing Urologic: negative for hematuria Abdominal: negative for nausea, vomiting, diarrhea, bright red blood per rectum, melena, or hematemesis Neurologic: negative for visual changes, syncope, or dizziness All other systems reviewed and  are otherwise negative except as noted above.  Labs:  Recent Labs  07/16/12 0220 07/16/12 0728  TROPONINI <0.30 <0.30   Lab Results  Component Value Date   WBC 5.5 07/16/2012   HGB 11.4* 07/16/2012   HCT 34.7* 07/16/2012   MCV 94.3 07/16/2012   PLT 150 07/16/2012     Recent Labs Lab 07/16/12 0728  NA 138  K 3.6  CL 98  CO2 25  BUN 24*  CREATININE 8.99*  CALCIUM 10.1  GLUCOSE 77    Radiology/Studies:  CXR 07/15/12 at [place]: stable enlarged heart with ectatic aorta. L subclavian vascular stent noted. No acute CP process.  EKG: NSR with PACs, incomplete RBBB, nonspecific ST-T changes including biphasic appearing T's III, V3 Tele has shown mostly NSR with PAC, PVC but also 12 beats of atrial tachycardia   Physical Exam: Blood pressure 120/55, pulse 65, temperature 98.1 F (36.7 C), temperature source Oral, resp. rate 16, height 5\' 5"  (1.651 m), weight 165 lb 3.2 oz (74.934 kg), SpO2 100.00%. General: Well developed, well nourished AAF laying flat, in no acute distress. Head: Normocephalic, atraumatic, sclera non-icteric, no xanthomas, nares are without discharge.  Neck: Negative for carotid bruits. JVD not elevated. Lungs: Clear bilaterally to auscultation without wheezes, rales, or rhonchi. Breathing is unlabored. Heart: RRR with S1 S2. No murmurs, rubs, or gallops appreciated. Abdomen: Soft, non-tender, non-distended with normoactive bowel sounds. No hepatomegaly. No rebound/guarding. No obvious abdominal masses. Msk:  Strength and tone appear normal for  age. Extremities: No clubbing or cyanosis. No edema.  Distal pedal pulses are 2+ and equal bilaterally. Dialysis fistula Neuro: Alert and oriented X 3. No facial asymmetry. No focal deficit. Moves all extremities spontaneously. Psych:  Responds to questions appropriately with a normal affect.   Assessment and Plan:  1. Chest pain, atypical 2. History of PAF, currently maintaining NSR, not previously felt to be Coumadin candidate 3. Nonsustained atrial tachycardia 4. ESRD, on HD  See below for discussion.  Signed, Ronie Spies PA-C 07/16/2012, 12:27 PM  As above, patient seen and examined. Briefly she is an 79 year old female with past medical history of end-stage renal disease dialysis dependent, atrial fibrillation felt not to be a Coumadin candidate because of falls, hypertension and diabetes mellitus who I am asked to evaluate for chest pain. She has limited mobility but typically denies dyspnea on exertion, orthopnea, PND, pedal edema or exertional chest pain. The patient ate a pork chop last evening and then developed pain under her left breast. The pain was described as a "stick and poking her". No associated symptoms. The pain did not radiate. Not pleuritic or positional. Duration approximately one hour and resolved. No chest pain since. Electrocardiogram shows sinus rhythm with nonspecific ST changes. Occasional PAC. Enzymes negative. Chest pain is extremely atypical. Okay to discharge from a cardiac standpoint. Would arrange outpatient functional study for risk stratification. Continue aspirin and Plavix for history of paroxysmal atrial fibrillation. Felt not to be a Coumadin candidate because of history of falls. Please call with questions. Olga Millers 12:53 PM

## 2012-07-16 NOTE — Consult Note (Signed)
[facility] Renal Consultation Note  Indication for Consultation:  Management of ESRD/hemodialysis; anemia, hypertension/volume and secondary hyperparathyroidism  HPI: [name] is a 79 y.o. female with ESRD on dialysis on TTS at the [facility] who was eating a pork chop yesterday when she had sudden severe substernal chest pain.  She denied shortness of breath, diaphoresis, or radiation of pain and took four baby aspirin at home, but EMS was called, and she came to the hospital for evaluation.  Her pain resolved shortly after she was given a sublingual nitroglycerin.  EKG was unremarkable, and so far cardiac enzymes have been negative.  She is currently comfortable and without complaints.  Dialysis Orders: Center: [facility] on TTS. EDW 72.5 kg  HD Bath 3K/2.25Ca  Time 3 hrs 15 mins  Heparin 1000 U. Access AVG @ LFA   BFR 450 DFR A1.5  Hectorol 6 mcg IV/HD   Epogen 3000 Units IV/HD  Venofer 100 mg on Thurs.   Past Medical History  Diagnosis Date  . Chest pain 07/07/10  . Hypertension   . Edema 07/24/10    of ankles  . Generalized headaches 03/12  . Lightheadedness 07/24/10  . Reflux   . Fatigue   . Arthritis   . Gout   . Diabetes mellitus     Borderline  . Peptic ulcer disease   . AF (paroxysmal atrial fibrillation)     Uses to be on Warfarin in the past but was taken off due to falls.   . CHF (congestive heart failure)     Normal EF.   Marland Kitchen Chronic kidney disease   . End stage renal disease on dialysis since 2010   History reviewed. No pertinent past surgical history. Family History  Problem Relation Age of Onset  . Cancer Father 62    cancer   Social History She quit smoking cigarettes about 33 years ago and denies any history of alcohol or illicit drug use.  She was married twice, but both husbands died, and she has six grown children.  Allergies  Allergen Reactions  . Clarithromycin   . Penicillins   . Sulfa Antibiotics    Prior to  Admission medications   Medication Sig Start Date End Date Taking? Authorizing Provider  allopurinol (ZYLOPRIM) 100 MG tablet Take 100 mg by mouth daily.     Yes Historical Provider, MD  aspirin 81 MG tablet Take 81 mg by mouth daily.     Yes Historical Provider, MD  b complex-vitamin c-folic acid (NEPHRO-VITE) 0.8 MG TABS Take 0.8 mg by mouth at bedtime.     Yes Historical Provider, MD  calcium acetate (PHOSLO) 667 MG capsule Take 667 mg by mouth 3 (three) times daily with meals.     Yes Historical Provider, MD  cephALEXin (KEFLEX) 250 MG capsule Take 250 mg by mouth 2 (two) times daily. Take for 7 days. First dose on 07/11/12   Yes Historical Provider, MD  clopidogrel (PLAVIX) 75 MG tablet Take 75 mg by mouth daily.     Yes Historical Provider, MD  Multiple Vitamin (MULTIVITAMIN) capsule Take 1 capsule by mouth daily.     Yes Historical Provider, MD  pantoprazole (PROTONIX) 40 MG tablet Take 40 mg by mouth daily.     Yes Historical Provider, MD  pyridOXINE (VITAMIN B-6) 100 MG tablet Take 100 mg by mouth daily.     Yes Historical Provider, MD  ranitidine (ZANTAC) 150 MG tablet Take 150 mg by mouth at bedtime.  Yes Historical Provider, MD  sevelamer (RENVELA) 800 MG tablet Take 800 mg by mouth 3 (three) times daily with meals.     Yes Historical Provider, MD   Labs:  Results for orders placed during the hospital encounter of 07/16/12 (from the past 48 hour(s))  CBC     Status: Abnormal   Collection Time    07/16/12  2:20 AM      Result Value Range   WBC 5.5  4.0 - 10.5 K/uL   RBC 3.68 (*) 3.87 - 5.11 MIL/uL   Hemoglobin 11.4 (*) 12.0 - 15.0 g/dL   HCT 16.1 (*) 09.6 - 04.5 %   MCV 94.3  78.0 - 100.0 fL   MCH 31.0  26.0 - 34.0 pg   MCHC 32.9  30.0 - 36.0 g/dL   RDW 40.9 (*) 81.1 - 91.4 %   Platelets 150  150 - 400 K/uL  CREATININE, SERUM     Status: Abnormal   Collection Time    07/16/12  2:20 AM      Result Value Range   Creatinine, Ser 8.71 (*) 0.50 - 1.10 mg/dL   GFR calc non Af  Amer 4 (*) >90 mL/min   GFR calc Af Amer 4 (*) >90 mL/min   Comment:            The eGFR has been calculated     using the CKD EPI equation.     This calculation has not been     validated in all clinical     situations.     eGFR's persistently     <90 mL/min signify     possible Chronic Kidney Disease.  TROPONIN I     Status: None   Collection Time    07/16/12  2:20 AM      Result Value Range   Troponin I <0.30  <0.30 ng/mL   Comment:            Due to the release kinetics of cTnI,     a negative result within the first hours     of the onset of symptoms does not rule out     myocardial infarction with certainty.     If myocardial infarction is still suspected,     repeat the test at appropriate intervals.  TROPONIN I     Status: None   Collection Time    07/16/12  7:28 AM      Result Value Range   Troponin I <0.30  <0.30 ng/mL   Comment:            Due to the release kinetics of cTnI,     a negative result within the first hours     of the onset of symptoms does not rule out     myocardial infarction with certainty.     If myocardial infarction is still suspected,     repeat the test at appropriate intervals.   Constitutional: negative for chills, fatigue, fevers and sweats Ears, nose, mouth, throat, and face: negative for earaches, hoarseness, nasal congestion and sore throat Respiratory: negative for cough, dyspnea on exertion, hemoptysis and sputum Cardiovascular: negative for chest pain, dyspnea, orthopnea and palpitations Gastrointestinal: negative for abdominal pain, change in bowel habits, nausea and vomiting Genitourinary:negative, non-oliguric Musculoskeletal:negative for arthralgias, back pain, myalgias and neck pain Neurological: negative for dizziness, gait problems, headaches, seizures and speech problems  Physical Exam: Filed Vitals:   07/16/12 0900  BP: 120/55  Pulse: 65  Temp:  Resp: 16     General appearance: alert, cooperative and no  distress Head: Normocephalic, without obvious abnormality, atraumatic Neck: no adenopathy, no carotid bruit, no JVD and supple, symmetrical, trachea midline Resp: clear to auscultation bilaterally Cardio: regular rate and rhythm, S1, S2 normal, no murmur, click, rub or gallop GI: soft, non-tender; bowel sounds normal; no masses,  no organomegaly Extremities: extremities normal, atraumatic, no cyanosis or edema Neurologic: Grossly normal Dialysis Access: AVG @ LFA with + bruit   Assessment/Plan: 1. Chest pain - relieved s/p SL NTG x 1; cardiac enzymes negative, EKG unremarkable, chest x-ray negative; possibly GI in etiology. 2. ESRD -  HD on TTS @ [facility].  HD today. 3. Hypertension/volume - BP 120/55 on no meds; current wt 74.9 kg with EDW 72.5 kg. 4. Anemia - Hgb 11.4 on outpatient Epogen 3000 U, Venofer qwk. 5. Metabolic bone disease - last Ca 9.3, P 5.5, iPTH 440; Hectorol 6 mcg, Renvela 1 with meals. 6. Nutrition - Last Alb 3.7, renal vitamin. 7. Hx PVD - on Plavix. 8. Hx gout - on Allopurinol.  LYLES,CHARLES 07/16/2012, 10:35 AM   Attending Nephrologist: Delano Metz, MD  Patient seen and examined.  Agree with assessment and plan as above. Vinson Moselle  MD [phone] pgr    [phone] cell 07/16/2012, 12:03 PM

## 2012-07-16 NOTE — Progress Notes (Signed)
Patient discharge home.  Discharge instructions given and explained to patient.   Copies of all forms given.  Patient voiced understanding.

## 2012-07-16 NOTE — H&P (Signed)
[facility] History and Physical  [name] ZOX:[id number] DOB: [birth year]    PCP:   Not local. Her physician is in [place].   Chief Complaint:  substernal chest pain.   HPI: [name] is an 79 y.o. female  with history of end-stage renal disease on hemodialysis (Tuesday, Thursday, Saturday and [place]), history of atypical chest pain, hypertension, diabetes, paroxysmal atrial fibrillation on aspirin and Plavix, history of gout, significant GERD, presents to [facility] complaining of chest pain. She stated her chest pain was severe, substernal, nonradiating, without accompanying shortness of breath or diaphoresis, nausea or vomiting. Evaluation in the emergency room included an EKG which was normal, a chest x-ray which was clear, and negative CPK and troponin. She was given one sublingual nitroglycerin, and her chest pain resolved completely. Hospitalist was asked to accept patient in transfer for cardiac rule out since she is a dialysis patient with [facility] does not possess dialysis capability. She denied prior history of exertional chest pain. She had a negative stress test many years ago.   Rewiew of Systems:  Constitutional: Negative for malaise, fever and chills. No significant weight loss or weight gain Eyes: Negative for eye pain, redness and discharge, diplopia, visual changes, or flashes of light. ENMT: Negative for ear pain, hoarseness, nasal congestion, sinus pressure and sore throat. No headaches; tinnitus, drooling, or problem swallowing. Cardiovascular: Negative for , palpitations, diaphoresis, dyspnea and peripheral edema. ; No orthopnea, PND Respiratory: Negative for cough, hemoptysis, wheezing and stridor. No pleuritic chestpain. Gastrointestinal: Negative for nausea, vomiting, diarrhea, constipation, abdominal pain, melena, blood in stool, hematemesis, jaundice and rectal bleeding.    Genitourinary: Negative for frequency, dysuria,  incontinence,flank pain and hematuria; Musculoskeletal: Negative for back pain and neck pain. Negative for swelling and trauma.;  Skin: . Negative for pruritus, rash, abrasions, bruising and skin lesion.; ulcerations Neuro: Negative for headache, lightheadedness and neck stiffness. Negative for weakness, altered level of consciousness , altered mental status, extremity weakness, burning feet, involuntary movement, seizure and syncope.  Psych: negative for anxiety, depression, insomnia, tearfulness, panic attacks, hallucinations, paranoia, suicidal or homicidal ideation   Past Medical History  Diagnosis Date  . Chest pain 07/07/10  . Hypertension   . Edema 07/24/10    of ankles  . Generalized headaches 03/12  . Lightheadedness 07/24/10  . Reflux   . Fatigue   . Arthritis   . Gout   . Diabetes mellitus     Borderline  . Peptic ulcer disease   . AF (paroxysmal atrial fibrillation)     Uses to be on Warfarin in the past but was taken off due to falls.   . CHF (congestive heart failure)     Normal EF.   Marland Kitchen Chronic kidney disease   . End stage renal disease on dialysis since 2010    History reviewed. No pertinent past surgical history.  Medications:  HOME MEDS: Prior to Admission medications   Medication Sig Start Date End Date Taking? Authorizing Provider  allopurinol (ZYLOPRIM) 100 MG tablet Take 100 mg by mouth daily.      Historical Provider, MD  aspirin 81 MG tablet Take 81 mg by mouth daily.      Historical Provider, MD  b complex-vitamin c-folic acid (NEPHRO-VITE) 0.8 MG TABS Take 0.8 mg by mouth at bedtime.      Historical Provider, MD  calcium acetate (PHOSLO) 667 MG capsule Take 667 mg by mouth 3 (three) times daily with meals.      Historical  Provider, MD  clopidogrel (PLAVIX) 75 MG tablet Take 75 mg by mouth daily.      Historical Provider, MD  loratadine (CLARITIN) 10 MG tablet Take 10 mg by mouth daily.      Historical Provider, MD  Multiple Vitamin (MULTIVITAMIN)  capsule Take 1 capsule by mouth daily.      Historical Provider, MD  pantoprazole (PROTONIX) 40 MG tablet Take 40 mg by mouth daily.      Historical Provider, MD  pyridOXINE (VITAMIN B-6) 100 MG tablet Take 100 mg by mouth daily.      Historical Provider, MD  ranitidine (ZANTAC) 150 MG tablet Take 150 mg by mouth at bedtime.      Historical Provider, MD  sevelamer (RENVELA) 800 MG tablet Take 800 mg by mouth 3 (three) times daily with meals.      Historical Provider, MD     Allergies:  Allergies  Allergen Reactions  . Clarithromycin   . Penicillins   . Sulfa Antibiotics     Social History:   reports that she quit smoking about 33 years ago. Her smoking use included Cigarettes. She smoked 0.00 packs per day. She does not have any smokeless tobacco history on file. She reports that she does not drink alcohol or use illicit drugs.  Family History: Family History  Problem Relation Age of Onset  . Cancer Father 70    cancer     Physical Exam: Filed Vitals:   07/16/12 0039  BP: 126/74  Pulse: 61  Temp: 97.9 F (36.6 C)  TempSrc: Oral  Resp: 20  Height: 5\' 5"  (1.651 m)  Weight: 74.934 kg (165 lb 3.2 oz)  SpO2: 100%   Blood pressure 126/74, pulse 61, temperature 97.9 F (36.6 C), temperature source Oral, resp. rate 20, height 5\' 5"  (1.651 m), weight 74.934 kg (165 lb 3.2 oz), SpO2 100.00%.  GEN:  Pleasant  patient lying in the stretcher in no acute distress; cooperative with exam. PSYCH:  alert and oriented x4; does not appear anxious or depressed; affect is appropriate. HEENT: Mucous membranes pink and anicteric; PERRLA; EOM intact; no cervical lymphadenopathy nor thyromegaly or carotid bruit; no JVD; There were no stridor. Neck is very supple. Breasts:: Not examined CHEST WALL: No tenderness CHEST: Normal respiration, clear to auscultation bilaterally.  HEART: Regular rate and rhythm.  There are no murmur, rub, or gallops.   BACK: No kyphosis or scoliosis; no CVA  tenderness ABDOMEN: soft and non-tender; no masses, no organomegaly, normal abdominal bowel sounds; no pannus; no intertriginous candida. There is no rebound and no distention. Rectal Exam: Not done EXTREMITIES: No bone or joint deformity; age-appropriate arthropathy of the hands and knees; no edema; no ulcerations.  There is no calf tenderness. She has a functioning AV fistula on her left forearm  Genitalia: not examined PULSES: 2+ and symmetric SKIN: Normal hydration no rash or ulceration CNS: Cranial nerves 2-12 grossly intact no focal lateralizing neurologic deficit.  Speech is fluent; uvula elevated with phonation, facial symmetry and tongue midline. DTR are normal bilaterally, cerebella exam is intact, barbinski is negative and strengths are equaled bilaterally.  No sensory loss.   Labs on Admission:  Basic Metabolic Panel: No results found for this basename: NA, K, CL, CO2, GLUCOSE, BUN, CREATININE, CALCIUM, MG, PHOS,  in the last 168 hours Liver Function Tests: No results found for this basename: AST, ALT, ALKPHOS, BILITOT, PROT, ALBUMIN,  in the last 168 hours No results found for this basename: LIPASE, AMYLASE,  in  the last 168 hours No results found for this basename: AMMONIA,  in the last 168 hours CBC: No results found for this basename: WBC, NEUTROABS, HGB, HCT, MCV, PLT,  in the last 168 hours Cardiac Enzymes: No results found for this basename: CKTOTAL, CKMB, CKMBINDEX, TROPONINI,  in the last 168 hours  CBG: No results found for this basename: GLUCAP,  in the last 168 hours   Radiological Exams on Admission: No results found.  EKG: Independently reviewed.  normal sinus rhythm without any acute ischemic changes    Assessment/Plan Present on Admission:  . RENAL FAILURE, CHRONIC . HYPERTENSION . HYPERLIPIDEMIA, MIXED . GERD . Atypical chest pain . Paroxysmal atrial fibrillation   PLAN:  Her chest pain is rather atypical. Given her increased cardiac risk factors,  will complete the rule out process. We'll cycle her troponins and obtain a cardiac echo. It is also possible that her chest pain is GI in its etiology and would be relieved with a single nitroglycerin as well. I have made her n.p.o. in case she will undergo another cardiac stress test. Please consult renal for her routine dialysis later today. She is stable, pain-free, full code, and will be admitted to [facility].  Thank you for allowing me to partake in the care of this nice patient.  Other plans as per orders.  Code Status: full code    Neoma Uhrich, MD. [facility] Pager [phone] 7pm to 7am.  07/16/2012, 2:23 AM

## 2012-07-16 NOTE — Progress Notes (Signed)
Left message on after hours line to schedule outpatient lexiscan myoview and f/u appt in our [facility] office [facility] office no longer open). Our office will call pt. Piedad Standiford PA-C

## 2012-07-16 NOTE — Discharge Summary (Signed)
Physician Discharge Summary  [name] WUJ:[id number] DOB: [birth year] DOA: 07/16/2012  PCP: No primary provider on file.  Admit date: 07/16/2012 Discharge date: 07/16/2012  Time spent: Less than 30 minutes minutes  Recommendations for Outpatient Follow-up:  1. [facility]: MDs office will call with appointment for outpatient stress test and followup. 2. Hemodialysis Center: Keep up regular hemodialysis appointments for Tuesdays, Thursdays and Saturdays.  Discharge Diagnoses:  Principal Problem:   Atypical chest pain Active Problems:   HYPERLIPIDEMIA, MIXED   HYPERTENSION   GERD   RENAL FAILURE, CHRONIC   Paroxysmal atrial fibrillation   Discharge Condition: Improved & Stable  Diet recommendation: Renal diet.  Filed Weights   07/16/12 0039 07/16/12 1225  Weight: 74.934 kg (165 lb 3.2 oz) 73.6 kg (162 lb 4.1 oz)    History of present illness:  Patient with ESRD on TTS HD, Afib (not Coumadin candidate), HTN, GERD, borderline DM was transferred from [facility] after she developed chest pain after eating some pork chop. The chest pain was under left breast, sharp/stabbing, non pleuritic and severe enough where she thought she was going to "die". She had some dyspnea. EMS was called and chest pain was relieved after sublingual NTG. Total duration no clear.    Hospital Course:  1. Chest pain, atypical: EKG: no acute changes. Troponin's negative. Cardiology consulted and felt that the chest pain was atypical. They cleared her for discharge and will arrange for OP stress test and follow up. Continue Aspirin &  Plavix. 2. Paroxysmal Atrial Fibrillation: was mostly in SR. Continue prior antiplatelets. 3. ESRD: Nephrology consulted and had HD prior to D/C. 4. HTN: controlled. 5. Hypoglycemia: likely due to NPO status- resolved. 6. Anemia: secondary to ESRD.  Procedures:  None  Consultations:  Cardiology  Nephrology  Discharge Exam:  Complaints: Denied  any further chest pains.  Filed Vitals:   07/16/12 1456 07/16/12 1530 07/16/12 1555 07/16/12 1556  BP: 101/50 107/52 124/56 123/55  Pulse: 54 66 64 62  Temp:    97.5 F (36.4 C)  TempSrc:    Oral  Resp: 15 16 15 16   Height:      Weight:      SpO2:    99%     General exam: Comfortable.  Respiratory system: Clear. No increased work of breathing.  Cardiovascular system: S1 and S2 heard, RRR. No JVD, murmurs or pedal edema.  Gastrointestinal system: Abdomen is nondistended, soft and nontender. Normal bowel sounds heard.  Central nervous system: Alert and oriented. No focal neurological deficits.  Extremities: Symmetric 5 x 5 power.  Discharge Instructions      Discharge Orders   Future Orders Complete By Expires     Call MD for:  severe uncontrolled pain  As directed     Discharge instructions  As directed     Comments:      DIET: Renal diet.    Increase activity slowly  As directed         Medication List    TAKE these medications       allopurinol 100 MG tablet  Commonly known as:  ZYLOPRIM  Take 100 mg by mouth daily.     aspirin 81 MG tablet  Take 81 mg by mouth daily.     b complex-vitamin c-folic acid 0.8 MG Tabs  Take 0.8 mg by mouth at bedtime.     calcium acetate 667 MG capsule  Commonly known as:  PHOSLO  Take 667 mg by mouth 3 (three)  times daily with meals.     cephALEXin 250 MG capsule  Commonly known as:  KEFLEX  Take 250 mg by mouth 2 (two) times daily. Take for 7 days. First dose on 07/11/12     clopidogrel 75 MG tablet  Commonly known as:  PLAVIX  Take 75 mg by mouth daily.     multivitamin capsule  Take 1 capsule by mouth daily.     pantoprazole 40 MG tablet  Commonly known as:  PROTONIX  Take 40 mg by mouth daily.     pyridOXINE 100 MG tablet  Commonly known as:  VITAMIN B-6  Take 100 mg by mouth daily.     ranitidine 150 MG tablet  Commonly known as:  ZANTAC  Take 150 mg by mouth at bedtime.     sevelamer carbonate 800  MG tablet  Commonly known as:  RENVELA  Take 800 mg by mouth 3 (three) times daily with meals.       Follow-up Information   Follow up with [facility]. (Our office will call you with stress test appointment and follow-up date)    Contact information:   [address] [place] [address] [phone]      Follow up with Hemodialysis Center. (Keep up regular dialysis appointments for Tuesdays, Thursdays and Saturdays.)        The results of significant diagnostics from this hospitalization (including imaging, microbiology, ancillary and laboratory) are listed below for reference.    Significant Diagnostic Studies: No results found.  Microbiology: No results found for this or any previous visit (from the past 240 hour(s)).   Labs: Basic Metabolic Panel:  Recent Labs Lab 07/16/12 0220 07/16/12 0728 07/16/12 1149  NA  --  138 140  K  --  3.6 3.8  CL  --  98 100  CO2  --  25 24  GLUCOSE  --  77 69*  BUN  --  24* 26*  CREATININE 8.71* 8.99* 9.13*  CALCIUM  --  10.1 9.9  PHOS  --  6.7* 6.3*   Liver Function Tests:  Recent Labs Lab 07/16/12 0728 07/16/12 1149  ALBUMIN 3.0* 3.2*   No results found for this basename: LIPASE, AMYLASE,  in the last 168 hours No results found for this basename: AMMONIA,  in the last 168 hours CBC:  Recent Labs Lab 07/16/12 0220  WBC 5.5  HGB 11.4*  HCT 34.7*  MCV 94.3  PLT 150   Cardiac Enzymes:  Recent Labs Lab 07/16/12 0220 07/16/12 0728 07/16/12 1154  TROPONINI <0.30 <0.30 <0.30   BNP: BNP (last 3 results) No results found for this basename: PROBNP,  in the last 8760 hours CBG: No results found for this basename: GLUCAP,  in the last 168 hours  Additional labs:    Signed:  Keli Buehner  [facility] 07/16/2012, 4:11 PM

## 2012-08-01 ENCOUNTER — Ambulatory Visit (HOSPITAL_COMMUNITY): Payer: Medicare Other | Attending: Cardiovascular Disease | Admitting: Radiology

## 2012-08-01 VITALS — BP 137/57 | HR 74 | Ht 65.0 in | Wt 163.0 lb

## 2012-08-01 DIAGNOSIS — R0602 Shortness of breath: Secondary | ICD-10-CM

## 2012-08-01 DIAGNOSIS — R0609 Other forms of dyspnea: Secondary | ICD-10-CM | POA: Insufficient documentation

## 2012-08-01 DIAGNOSIS — R5381 Other malaise: Secondary | ICD-10-CM | POA: Insufficient documentation

## 2012-08-01 DIAGNOSIS — R0989 Other specified symptoms and signs involving the circulatory and respiratory systems: Secondary | ICD-10-CM | POA: Insufficient documentation

## 2012-08-01 DIAGNOSIS — R079 Chest pain, unspecified: Secondary | ICD-10-CM | POA: Insufficient documentation

## 2012-08-01 MED ORDER — TECHNETIUM TC 99M SESTAMIBI GENERIC - CARDIOLITE
30.0000 | Freq: Once | INTRAVENOUS | Status: AC | PRN
  Administered 2012-08-01: 30 via INTRAVENOUS

## 2012-08-01 MED ORDER — TECHNETIUM TC 99M SESTAMIBI GENERIC - CARDIOLITE
10.0000 | Freq: Once | INTRAVENOUS | Status: AC | PRN
  Administered 2012-08-01: 10 via INTRAVENOUS

## 2012-08-01 MED ORDER — AMINOPHYLLINE 25 MG/ML IV SOLN
75.0000 mg | Freq: Once | INTRAVENOUS | Status: AC
  Administered 2012-08-01: 75 mg via INTRAVENOUS

## 2012-08-01 MED ORDER — REGADENOSON 0.4 MG/5ML IV SOLN
0.4000 mg | Freq: Once | INTRAVENOUS | Status: AC
  Administered 2012-08-01: 0.4 mg via INTRAVENOUS

## 2012-08-01 MED ORDER — AMINOPHYLLINE 25 MG/ML IV SOLN: 75.0000 mg | Freq: Once | INTRAVENOUS | Status: AC

## 2012-08-01 NOTE — Progress Notes (Signed)
[facility] SITE 3 NUCLEAR MED [address] [place], [place] [address] [phone]    Cardiology Nuclear Med Study  [name] is a 79 y.o. female     MRN : [id number]     DOB: Jul 21, 1927  Procedure Date: 08/01/2012  Nuclear Med Background Indication for Stress Test:  Evaluation for Ischemia and Post Hospital with Chest Pain, negative enzymes History:  h/o PAF; '09 Echo:EF=65%, mild MR; '12 EXB:MWUXLK, EF=78% Cardiac Risk Factors: History of Smoking, Hypertension, NIDDM and IRBBB  Symptoms:  Chest Pain (last date of chest discomfort none since discharge ), DOE and Fatigue   Nuclear Pre-Procedure Caffeine/Decaff Intake:  None NPO After: 7:00pm   Lungs:  Clear. O2 Sat: 97% on room air. IV 0.9% NS with Angio Cath:  24g  IV Site: R Wrist  IV Started by:  Cathlyn Parsons, RN  Chest Size (in):  36 Cup Size: A  Height: 5\' 5"  (1.651 m)  Weight:  163 lb (73.936 kg)  BMI:  Body mass index is 27.12 kg/(m^2). Tech Comments:  No med's taken    Nuclear Med Study 1 or 2 day study: 1 day  Stress Test Type:  Lexiscan  Reading MD: Charlton Haws, MD  Order Authorizing Provider:  Lorine Bears, MD; Norma Fredrickson, NP  Resting Radionuclide: Technetium 78m Sestamibi  Resting Radionuclide Dose: 11.0 mCi   Stress Radionuclide:  Technetium 83m Sestamibi  Stress Radionuclide Dose: 33.0 mCi           Stress Protocol Rest HR: 74 Stress HR: 86  Rest BP: 137/57 Stress BP: 112/55  Exercise Time (min): n/a METS: n/a   Predicted Max HR: 138 bpm % Max HR: 62.32 bpm Rate Pressure Product: 9632   Dose of Adenosine (mg):  n/a Dose of Lexiscan: 0.4 mg  Dose of Atropine (mg): n/a Dose of Dobutamine: n/a mcg/kg/min (at max HR)  Stress Test Technologist: Smiley Houseman, CMA-N  Nuclear Technologist:  Domenic Polite, CNMT     Rest Procedure:  Myocardial perfusion imaging was performed at rest 45 minutes following the intravenous administration of Technetium 54m Sestamibi.  Rest  ECG: NSR - Normal EKG  Stress Procedure:  The patient received IV Lexiscan 0.4 mg over 15-seconds.  Technetium 21m Sestamibi injected at 30-seconds. She c/o stomach cramps and a headache with Lexiscan, that was relieved with Aminophylline 75 mg.   She c/o a "weird, itching" feeling allover immediately after Aminophylline was given, that moved just to her right side.  She was assessed for allergic reaction and no hives or difficulty breathing were noticed.  Symptom quickly subsided.  Quantitative spect images were obtained after a 45 minute delay.  Stress ECG: No significant change from baseline ECG  QPS Raw Data Images:  Normal; no motion artifact; normal heart/lung ratio. Stress Images:  Normal homogeneous uptake in all areas of the myocardium. Rest Images:  Normal homogeneous uptake in all areas of the myocardium. Subtraction (SDS):  Normal Transient Ischemic Dilatation (Normal <1.22):  0.93 Lung/Heart Ratio (Normal <0.45):  0.35  Quantitative Gated Spect Images QGS EDV:  67 ml QGS ESV:  15 ml  Impression Exercise Capacity:  Lexiscan with no exercise. BP Response:  Normal blood pressure response. Clinical Symptoms:  Stomach Cramps ECG Impression:  No significant ST segment change suggestive of ischemia. Comparison with Prior Nuclear Study: No images to compare  Overall Impression:  Normal stress nuclear study.  LV Ejection Fraction: 78%.  LV Wall Motion:  NL LV Function; NL Wall Motion  Charlton Haws

## 2012-08-02 NOTE — Addendum Note (Signed)
Addended by: Milinda Antis on: 08/02/2012 08:22 AM   Modules accepted: Orders

## 2012-08-04 ENCOUNTER — Encounter (HOSPITAL_COMMUNITY): Payer: Self-pay | Admitting: *Deleted

## 2012-08-08 ENCOUNTER — Encounter: Payer: Medicare Other | Admitting: Nurse Practitioner

## 2012-11-08 ENCOUNTER — Other Ambulatory Visit: Payer: Self-pay | Admitting: *Deleted

## 2012-11-08 DIAGNOSIS — T82598A Other mechanical complication of other cardiac and vascular devices and implants, initial encounter: Secondary | ICD-10-CM

## 2012-11-11 ENCOUNTER — Encounter: Payer: Self-pay | Admitting: Vascular Surgery

## 2012-12-07 ENCOUNTER — Ambulatory Visit: Payer: Medicare Other | Admitting: Vascular Surgery

## 2012-12-14 ENCOUNTER — Ambulatory Visit: Payer: Medicare Other | Admitting: Vascular Surgery

## 2012-12-20 ENCOUNTER — Encounter: Payer: Self-pay | Admitting: Vascular Surgery

## 2012-12-21 ENCOUNTER — Ambulatory Visit (INDEPENDENT_AMBULATORY_CARE_PROVIDER_SITE_OTHER): Payer: Medicare Other | Admitting: Vascular Surgery

## 2012-12-21 ENCOUNTER — Encounter (INDEPENDENT_AMBULATORY_CARE_PROVIDER_SITE_OTHER): Payer: Medicare Other | Admitting: *Deleted

## 2012-12-21 ENCOUNTER — Other Ambulatory Visit: Payer: Self-pay

## 2012-12-21 ENCOUNTER — Encounter: Payer: Self-pay | Admitting: Vascular Surgery

## 2012-12-21 VITALS — BP 133/108 | HR 114 | Resp 20 | Ht 65.0 in | Wt 154.0 lb

## 2012-12-21 DIAGNOSIS — N186 End stage renal disease: Secondary | ICD-10-CM | POA: Insufficient documentation

## 2012-12-21 DIAGNOSIS — T82598A Other mechanical complication of other cardiac and vascular devices and implants, initial encounter: Secondary | ICD-10-CM

## 2012-12-21 NOTE — Progress Notes (Signed)
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 Referred by Dr. Patel.  HPI: [name] is a 79 y.o. female who had a left forearm AV graft placed in February of 2012. In reviewing her records from the dialysis center it appears that she's had multiple thrombolyis procedures on her left forearm AV graft. 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 He denies fever or chills. She dialyzes on Tuesdays Thursdays and Saturdays. She denies any recent uremic symptoms. Specifically she denies nausea, vomiting, fatigue, or anorexia.  Past Medical History  Diagnosis Date  . Chest pain 07/07/10  . Hypertension   . Edema 07/24/10    of ankles  . Generalized headaches 03/12  . Lightheadedness 07/24/10  . Reflux   . Fatigue   . Arthritis   . Gout   . Hyperglycemia     Borderline diabetes  . Peptic ulcer disease   . AF (paroxysmal atrial fibrillation)     Used to be on Warfarin in the past but was taken off due to falls, memory problems.  . CHF (congestive heart failure)     30 years ago  . End stage renal disease on dialysis since 2010    related to Vioxx    Family History  Problem Relation Age of Onset  . Cancer Father 77    cancer  . Other Mother     mother died from burns  . Kidney disease Sister   . CAD Neg Hx     SOCIAL HISTORY: History  Substance Use Topics  . Smoking status: Former Smoker    Types: Cigarettes    Quit date: 04/28/1979  . Smokeless tobacco: Never Used     Comment: rare - 1-2x/week, quit 30 yrs ago  . Alcohol Use: No    Allergies  Allergen Reactions  . Aminophylline Itching  . Clarithromycin   . Penicillins   . Sulfa Antibiotics     Current Outpatient Prescriptions  Medication Sig Dispense Refill  . allopurinol (ZYLOPRIM) 100 MG tablet Take 100 mg by mouth  daily.        . aspirin 81 MG tablet Take 81 mg by mouth daily.        . b complex-vitamin c-folic acid (NEPHRO-VITE) 0.8 MG TABS Take 0.8 mg by mouth at bedtime.        . calcium acetate (PHOSLO) 667 MG capsule Take 667 mg by mouth 3 (three) times daily with meals.        . cephALEXin (KEFLEX) 250 MG capsule Take 250 mg by mouth 2 (two) times daily. Take for 7 days. First dose on 07/11/12      . clopidogrel (PLAVIX) 75 MG tablet Take 75 mg by mouth daily.        . diphenoxylate-atropine (LOMOTIL) 2.5-0.025 MG per tablet Take 1 tablet by mouth 2 (two) times daily as needed for diarrhea or loose stools.      . Multiple Vitamin (MULTIVITAMIN) capsule Take 1 capsule by mouth daily.        . pantoprazole (PROTONIX) 40 MG tablet Take 40 mg by mouth daily.        . pyridOXINE (VITAMIN B-6) 100 MG tablet Take 100 mg by mouth daily.        . ranitidine (ZANTAC) 150 MG tablet Take 150 mg by   mouth at bedtime.        . sevelamer (RENVELA) 800 MG tablet Take 800 mg by mouth 3 (three) times daily with meals.         No current facility-administered medications for this visit.    REVIEW OF SYSTEMS: [X ] denotes positive finding; [  ] denotes negative finding  CARDIOVASCULAR:  [ ] chest pain   [ ] chest pressure   [ ] palpitations   [ ] orthopnea   [ ] dyspnea on exertion   [ ] claudication   [ ] rest pain   [ ] DVT   [ ] phlebitis PULMONARY:   [ ] productive cough   [ ] asthma   [ ] wheezing NEUROLOGIC:   [ ] weakness  [ ] paresthesias  [ ] aphasia  [ ] amaurosis  [ ] dizziness HEMATOLOGIC:   [ ] bleeding problems   [ ] clotting disorders MUSCULOSKELETAL:  [ ] joint pain   [ ] joint swelling [ ] leg swelling GASTROINTESTINAL: [ ]  blood in stool  [ ]  hematemesis GENITOURINARY:  [ ]  dysuria  [ ]  hematuria PSYCHIATRIC:  [ ] history of major depression INTEGUMENTARY:  [ ] rashes  [ ] ulcers CONSTITUTIONAL:  [ ] fever   [ ] chills  PHYSICAL EXAM: Filed Vitals:   12/21/12 1036  BP: 133/108  Pulse:  114  Resp: 20  Height: 5' 5" (1.651 m)  Weight: 154 lb (69.854 kg)   Body mass index is 25.63 kg/(m^2). GENERAL: The patient is a well-nourished female, in no acute distress. The vital signs are documented above. CARDIOVASCULAR: There is a regular rate and rhythm.  PULMONARY: There is good air exchange bilaterally without wheezing or rales. ABDOMEN: Soft and non-tender with normal pitched bowel sounds.  MUSCULOSKELETAL: There are no major deformities or cyanosis. NEUROLOGIC: No focal weakness or paresthesias are detected. SKIN: There are no ulcers or rashes noted. Her graft is somewhat pulsatile. 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 The venous outflow was noted to have a stent with elevated velocities throughout this area.  MEDICAL ISSUES: I have recommended revision of her left forearm AV graft. I would plan on replacing the venous half of the graft however I may have to extend significantly up the upper arm in order to find an adequate out flow vein given the findings on her duplex scan. One option would be to perform an intraoperative fistulogram to assess this area before making a decision on how high to revise her graft. I have explained that the graft is becoming degenerative at some point she may need a new upper arm graft. Have reviewed her previous interventional radiology procedures and she has not had any recent fistulogram to the last 2-1/2 years at [facility]. Her surgery has been scheduled for 01/06/2013. The procedure and risk were discussed with her all her questions were answered. Her family is given consent as she does have some issues with delirium and psychosis.  [name] S [facility]  [place] Beeper: [phone]    

## 2012-12-23 ENCOUNTER — Telehealth: Payer: Self-pay | Admitting: *Deleted

## 2012-12-23 NOTE — Telephone Encounter (Signed)
[name] called about 4:50 yesterday to see if his mother's surgery could be moved up from 01/06/13. He is "scared" to wait to see if it stops working or ruptures before that. I explained that due to her dialysis schedule Dr Edilia Bo could only do her on Friday and 9/12 was next available. He wanted to see if we could change her dialysis day and do it next Tuesday 12/27/12. I called  [facility] & no one answered; I left a message for them to call me this morning. I talked with Morrie Sheldon at [facility] this morning and she said [name] could come in Monday for dialysis. She said she would discuss time with her during dialysis Saturday. I called & talked with [name], [name]'s wife. I gave her instructions for surgery 12/27/12 and told her that it was real important for [name] to dialyze on Monday before her surgery. She verbalized understanding and was going to talk to her husband & [name].

## 2012-12-26 MED ORDER — VANCOMYCIN HCL IN DEXTROSE 1-5 GM/200ML-% IV SOLN: 1000.0000 mg | INTRAVENOUS | Status: DC

## 2012-12-27 ENCOUNTER — Encounter (HOSPITAL_COMMUNITY): Payer: Self-pay | Admitting: Certified Registered"

## 2012-12-27 ENCOUNTER — Encounter (HOSPITAL_COMMUNITY): Admission: RE | Payer: Self-pay | Source: Ambulatory Visit

## 2012-12-27 ENCOUNTER — Ambulatory Visit (HOSPITAL_COMMUNITY): Admission: RE | Admit: 2012-12-27 | Payer: Medicare Other | Source: Ambulatory Visit | Admitting: Vascular Surgery

## 2012-12-27 SURGERY — REVISION OF ARTERIOVENOUS GORETEX GRAFT
Anesthesia: Monitor Anesthesia Care | Site: Arm Lower | Laterality: Left

## 2012-12-27 NOTE — Preoperative (Signed)
Beta Blockers   Reason not to administer Beta Blockers:Not Applicable 

## 2013-01-02 ENCOUNTER — Encounter (HOSPITAL_COMMUNITY): Payer: Self-pay | Admitting: Pharmacist

## 2013-01-05 ENCOUNTER — Encounter (HOSPITAL_COMMUNITY): Payer: Self-pay | Admitting: *Deleted

## 2013-01-06 ENCOUNTER — Encounter (HOSPITAL_COMMUNITY): Payer: Self-pay | Admitting: Anesthesiology

## 2013-01-06 ENCOUNTER — Encounter (HOSPITAL_COMMUNITY)
Admission: RE | Disposition: A | Discharge status: Home / Self Care | Payer: Self-pay | Source: Ambulatory Visit | Attending: Vascular Surgery

## 2013-01-06 ENCOUNTER — Encounter (HOSPITAL_COMMUNITY): Payer: Self-pay | Admitting: *Deleted

## 2013-01-06 ENCOUNTER — Ambulatory Visit (HOSPITAL_COMMUNITY)
Admission: RE | Admit: 2013-01-06 | Discharge: 2013-01-06 | Disposition: A | Discharge status: Home / Self Care | Payer: Medicaid Other | Source: Ambulatory Visit | Attending: Vascular Surgery | Admitting: Vascular Surgery

## 2013-01-06 ENCOUNTER — Ambulatory Visit (HOSPITAL_COMMUNITY): Payer: Medicaid Other | Admitting: Anesthesiology

## 2013-01-06 ENCOUNTER — Ambulatory Visit (HOSPITAL_COMMUNITY): Payer: Medicaid Other

## 2013-01-06 DIAGNOSIS — T82898A Other specified complication of vascular prosthetic devices, implants and grafts, initial encounter: Secondary | ICD-10-CM | POA: Insufficient documentation

## 2013-01-06 DIAGNOSIS — N186 End stage renal disease: Secondary | ICD-10-CM | POA: Insufficient documentation

## 2013-01-06 DIAGNOSIS — I4891 Unspecified atrial fibrillation: Secondary | ICD-10-CM | POA: Insufficient documentation

## 2013-01-06 DIAGNOSIS — R7309 Other abnormal glucose: Secondary | ICD-10-CM | POA: Insufficient documentation

## 2013-01-06 DIAGNOSIS — I120 Hypertensive chronic kidney disease with stage 5 chronic kidney disease or end stage renal disease: Secondary | ICD-10-CM | POA: Insufficient documentation

## 2013-01-06 DIAGNOSIS — K219 Gastro-esophageal reflux disease without esophagitis: Secondary | ICD-10-CM | POA: Insufficient documentation

## 2013-01-06 DIAGNOSIS — Z9181 History of falling: Secondary | ICD-10-CM | POA: Insufficient documentation

## 2013-01-06 DIAGNOSIS — M129 Arthropathy, unspecified: Secondary | ICD-10-CM | POA: Insufficient documentation

## 2013-01-06 DIAGNOSIS — Z992 Dependence on renal dialysis: Secondary | ICD-10-CM | POA: Insufficient documentation

## 2013-01-06 DIAGNOSIS — E119 Type 2 diabetes mellitus without complications: Secondary | ICD-10-CM | POA: Insufficient documentation

## 2013-01-06 DIAGNOSIS — I509 Heart failure, unspecified: Secondary | ICD-10-CM | POA: Insufficient documentation

## 2013-01-06 DIAGNOSIS — M109 Gout, unspecified: Secondary | ICD-10-CM | POA: Insufficient documentation

## 2013-01-06 DIAGNOSIS — Y831 Surgical operation with implant of artificial internal device as the cause of abnormal reaction of the patient, or of later complication, without mention of misadventure at the time of the procedure: Secondary | ICD-10-CM | POA: Insufficient documentation

## 2013-01-06 DIAGNOSIS — Z01818 Encounter for other preprocedural examination: Secondary | ICD-10-CM | POA: Insufficient documentation

## 2013-01-06 HISTORY — DX: Gastro-esophageal reflux disease without esophagitis: K21.9

## 2013-01-06 HISTORY — DX: Type 2 diabetes mellitus without complications: E11.9

## 2013-01-06 HISTORY — PX: REVISION OF ARTERIOVENOUS GORETEX GRAFT: SHX6073

## 2013-01-06 LAB — POCT I-STAT 4, (NA,K, GLUC, HGB,HCT)
Hemoglobin: 11.9 g/dL — ABNORMAL LOW (ref 12.0–15.0)
Potassium: 3.8 mEq/L (ref 3.5–5.1)
Sodium: 141 mEq/L (ref 135–145)

## 2013-01-06 SURGERY — REVISION OF ARTERIOVENOUS GORETEX GRAFT
Anesthesia: Monitor Anesthesia Care | Site: Arm Lower | Laterality: Left | Wound class: Clean

## 2013-01-06 MED ORDER — VANCOMYCIN HCL IN DEXTROSE 1-5 GM/200ML-% IV SOLN
1000.0000 mg | INTRAVENOUS | Status: AC
  Administered 2013-01-06: 1000 mg via INTRAVENOUS
  Filled 2013-01-06: qty 200

## 2013-01-06 MED ORDER — LIDOCAINE-EPINEPHRINE (PF) 1 %-1:200000 IJ SOLN
INTRAMUSCULAR | Status: DC | PRN
  Administered 2013-01-06: 25 mL

## 2013-01-06 MED ORDER — THROMBIN 20000 UNITS EX SOLR
CUTANEOUS | Status: DC | PRN
  Administered 2013-01-06: 12:00:00 via TOPICAL

## 2013-01-06 MED ORDER — ONDANSETRON HCL 4 MG/2ML IJ SOLN: 4.0000 mg | Freq: Four times a day (QID) | INTRAMUSCULAR | Status: DC | PRN

## 2013-01-06 MED ORDER — PROPOFOL INFUSION 10 MG/ML OPTIME
INTRAVENOUS | Status: DC | PRN
  Administered 2013-01-06: 50 ug/kg/min via INTRAVENOUS

## 2013-01-06 MED ORDER — MIDAZOLAM HCL 5 MG/5ML IJ SOLN
INTRAMUSCULAR | Status: DC | PRN
  Administered 2013-01-06: 0.5 mg via INTRAVENOUS
  Administered 2013-01-06: 1 mg via INTRAVENOUS

## 2013-01-06 MED ORDER — SODIUM CHLORIDE 0.9 % IR SOLN
Status: DC | PRN
  Administered 2013-01-06: 11:00:00

## 2013-01-06 MED ORDER — OXYCODONE HCL 5 MG PO TABS: 5.0000 mg | ORAL_TABLET | Freq: Once | ORAL | Status: DC | PRN

## 2013-01-06 MED ORDER — SODIUM CHLORIDE 0.9 % IV SOLN
INTRAVENOUS | Status: DC
  Administered 2013-01-06: 09:00:00 via INTRAVENOUS

## 2013-01-06 MED ORDER — FENTANYL CITRATE 0.05 MG/ML IJ SOLN
INTRAMUSCULAR | Status: DC | PRN
  Administered 2013-01-06: 25 ug via INTRAVENOUS
  Administered 2013-01-06: 50 ug via INTRAVENOUS

## 2013-01-06 MED ORDER — OXYCODONE HCL 5 MG/5ML PO SOLN: 5.0000 mg | Freq: Once | ORAL | Status: DC | PRN

## 2013-01-06 MED ORDER — 0.9 % SODIUM CHLORIDE (POUR BTL) OPTIME
TOPICAL | Status: DC | PRN
  Administered 2013-01-06: 1000 mL

## 2013-01-06 MED ORDER — FENTANYL CITRATE 0.05 MG/ML IJ SOLN: 25.0000 ug | INTRAMUSCULAR | Status: DC | PRN

## 2013-01-06 MED ORDER — OXYCODONE-ACETAMINOPHEN 5-325 MG PO TABS: 1.0000 | ORAL_TABLET | ORAL | Status: DC | PRN

## 2013-01-06 MED ORDER — THROMBIN 20000 UNITS EX SOLR
CUTANEOUS | Status: AC
  Filled 2013-01-06: qty 20000

## 2013-01-06 MED ORDER — PROTAMINE SULFATE 10 MG/ML IV SOLN
INTRAVENOUS | Status: DC | PRN
  Administered 2013-01-06: 30 mg via INTRAVENOUS

## 2013-01-06 MED ORDER — PHENYLEPHRINE HCL 10 MG/ML IJ SOLN
INTRAMUSCULAR | Status: DC | PRN
  Administered 2013-01-06 (×6): 40 ug via INTRAVENOUS

## 2013-01-06 MED ORDER — HEPARIN SODIUM (PORCINE) 1000 UNIT/ML IJ SOLN
INTRAMUSCULAR | Status: DC | PRN
  Administered 2013-01-06: 5000 [IU] via INTRAVENOUS

## 2013-01-06 SURGICAL SUPPLY — 36 items
CANISTER SUCTION 2500CC (MISCELLANEOUS) ×2 IMPLANT
CLIP TI MEDIUM 6 (CLIP) ×2 IMPLANT
CLIP TI WIDE RED SMALL 6 (CLIP) ×2 IMPLANT
COVER SURGICAL LIGHT HANDLE (MISCELLANEOUS) ×2 IMPLANT
DECANTER SPIKE VIAL GLASS SM (MISCELLANEOUS) ×2 IMPLANT
DERMABOND ADVANCED (GAUZE/BANDAGES/DRESSINGS) ×1
DERMABOND ADVANCED .7 DNX12 (GAUZE/BANDAGES/DRESSINGS) ×1 IMPLANT
ELECT REM PT RETURN 9FT ADLT (ELECTROSURGICAL) ×2
ELECTRODE REM PT RTRN 9FT ADLT (ELECTROSURGICAL) ×1 IMPLANT
GLOVE BIO SURGEON STRL SZ7.5 (GLOVE) ×2 IMPLANT
GLOVE BIOGEL PI IND STRL 7.0 (GLOVE) ×2 IMPLANT
GLOVE BIOGEL PI IND STRL 7.5 (GLOVE) ×1 IMPLANT
GLOVE BIOGEL PI IND STRL 8 (GLOVE) ×1 IMPLANT
GLOVE BIOGEL PI INDICATOR 7.0 (GLOVE) ×2
GLOVE BIOGEL PI INDICATOR 7.5 (GLOVE) ×1
GLOVE BIOGEL PI INDICATOR 8 (GLOVE) ×1
GLOVE ECLIPSE 6.5 STRL STRAW (GLOVE) ×2 IMPLANT
GLOVE SS BIOGEL STRL SZ 6.5 (GLOVE) ×1 IMPLANT
GLOVE SUPERSENSE BIOGEL SZ 6.5 (GLOVE) ×1
GOWN STRL NON-REIN LRG LVL3 (GOWN DISPOSABLE) ×6 IMPLANT
GRAFT GORETEX STRT 4-7X45 (Vascular Products) ×2 IMPLANT
KIT BASIN OR (CUSTOM PROCEDURE TRAY) ×2 IMPLANT
KIT ROOM TURNOVER OR (KITS) ×2 IMPLANT
NEEDLE HYPO 25GX1X1/2 BEV (NEEDLE) ×2 IMPLANT
NS IRRIG 1000ML POUR BTL (IV SOLUTION) ×2 IMPLANT
PACK CV ACCESS (CUSTOM PROCEDURE TRAY) ×2 IMPLANT
PAD ARMBOARD 7.5X6 YLW CONV (MISCELLANEOUS) ×4 IMPLANT
SPONGE SURGIFOAM ABS GEL 100 (HEMOSTASIS) IMPLANT
SUT PROLENE 6 0 BV (SUTURE) ×6 IMPLANT
SUT VIC AB 3-0 SH 27 (SUTURE) ×2
SUT VIC AB 3-0 SH 27X BRD (SUTURE) ×2 IMPLANT
SUT VICRYL 4-0 PS2 18IN ABS (SUTURE) ×4 IMPLANT
TOWEL OR 17X24 6PK STRL BLUE (TOWEL DISPOSABLE) ×2 IMPLANT
TOWEL OR 17X26 10 PK STRL BLUE (TOWEL DISPOSABLE) ×2 IMPLANT
UNDERPAD 30X30 INCONTINENT (UNDERPADS AND DIAPERS) ×2 IMPLANT
WATER STERILE IRR 1000ML POUR (IV SOLUTION) ×2 IMPLANT

## 2013-01-06 NOTE — Progress Notes (Signed)
Dialysis access report faxed to [facility].

## 2013-01-06 NOTE — Op Note (Signed)
NAME: ASLYN COTTMAN   MRN: [id number] DOB: Aug 09, 1927    DATE OF OPERATION: 01/06/2013  PREOP DIAGNOSIS: aneurysm of left forearm AV graft  POSTOP DIAGNOSIS: same  PROCEDURE: revision of left forearm AV graft with replacement of the venous half of the AV graft  SURGEON: Di Kindle. Edilia Bo, MD, FACS  ASSIST: Della Goo PA  ANESTHESIA: local with sedation   EBL: minimal  INDICATIONS: HEYDY MONTILLA is a 79 y.o. 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  FINDINGS: Good thrill at the completion of the procedure.  TECHNIQUE: The patient was taken to the operating room and sedated by anesthesia. The left upper extremity was prepped and draped in the usual sterile fashion. After the skin was infiltrated with 1% lidocaine, an incision was made over the distal loop of the graft and also just below the antecubital level on the ulnar half of the forearm. The graft here in both areas was dissected free. A tunnel was created between the 2 incisions and a new 7 mm graft tunneled between the 2 incisions. The new segment of graft was in the more central portion of the forearm. The patient was heparinized. The graft was clamped at both ends and divided. The new segment of graft was sewn into in distally with running 6-0 Prolene suture. The new segment of graft was then cut the appropriate length for anastomosis and in the old graft. This anastomosis was done with continuous 6-0 Prolene suture. Completion was an excellent thrill in the graft. Hemostasis was obtained in the wounds. The wounds were closed the deep layer of 3-0 Vicryl the skin closed with 4-0 Vicryl. Dermabond was applied. Patient tolerated procedure well was transferred to the recovery room in stable condition. All needle and sponge counts were correct.  Waverly Ferrari, MD, FACS [facility]  DATE OF DICTATION:   01/06/2013

## 2013-01-06 NOTE — Anesthesia Postprocedure Evaluation (Signed)
Anesthesia Post Note  Patient: [name]  Procedure(s) Performed: Procedure(s) (LRB): REVISION OF ARTERIOVENOUS GORETEX GRAFT (Left)  Anesthesia type: MAC  Patient location: PACU  Post pain: Pain level controlled and Adequate analgesia  Post assessment: Post-op Vital signs reviewed, Patient's Cardiovascular Status Stable and Respiratory Function Stable  Last Vitals:  Filed Vitals:   01/06/13 1230  BP: 123/58  Pulse: 76  Temp: 36.1 C  Resp: 12    Post vital signs: Reviewed and stable  Level of consciousness: awake, alert  and oriented  Complications: No apparent anesthesia complications

## 2013-01-06 NOTE — Interval H&P Note (Signed)
History and Physical Interval Note:  01/06/2013 10:11 AM  [name]  has presented today for surgery, with the diagnosis of ESRD  The various methods of treatment have been discussed with the patient and family. After consideration of risks, benefits and other options for treatment, the patient has consented to  Procedure(s): REVISION OF ARTERIOVENOUS GORETEX GRAFT (Left) as a surgical intervention .  The patient's history has been reviewed, patient examined, no change in status, stable for surgery.  I have reviewed the patient's chart and labs.  Questions were answered to the patient's satisfaction.     Keltie Labell S

## 2013-01-06 NOTE — H&P (View-Only) (Signed)
[facility] [place]  Patient name: [name] MRN: [id number] DOB: 05-May-1927 Sex: female  REASON FOR VISIT: evaluate aneurysm of left AV graft. Referred by Dr. Allena Katz.  HPI: [name] is a 79 y.o. female who had a left forearm AV graft placed in February of 2012. In reviewing her records from the dialysis center it appears that she's had multiple thrombolyis procedures on her left forearm AV graft. 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 He denies fever or chills. She dialyzes on Tuesdays Thursdays and Saturdays. She denies any recent uremic symptoms. Specifically she denies nausea, vomiting, fatigue, or anorexia.  Past Medical History  Diagnosis Date  . Chest pain 07/07/10  . Hypertension   . Edema 07/24/10    of ankles  . Generalized headaches 03/12  . Lightheadedness 07/24/10  . Reflux   . Fatigue   . Arthritis   . Gout   . Hyperglycemia     Borderline diabetes  . Peptic ulcer disease   . AF (paroxysmal atrial fibrillation)     Used to be on Warfarin in the past but was taken off due to falls, memory problems.  . CHF (congestive heart failure)     30 years ago  . End stage renal disease on dialysis since 2010    related to Vioxx    Family History  Problem Relation Age of Onset  . Cancer Father 58    cancer  . Other Mother     mother died from burns  . Kidney disease Sister   . CAD Neg Hx     SOCIAL HISTORY: History  Substance Use Topics  . Smoking status: Former Smoker    Types: Cigarettes    Quit date: 04/28/1979  . Smokeless tobacco: Never Used     Comment: rare - 1-2x/week, quit 30 yrs ago  . Alcohol Use: No    Allergies  Allergen Reactions  . Aminophylline Itching  . Clarithromycin   . Penicillins   . Sulfa Antibiotics     Current Outpatient Prescriptions  Medication Sig Dispense Refill  . allopurinol (ZYLOPRIM) 100 MG tablet Take 100 mg by mouth  daily.        Marland Kitchen aspirin 81 MG tablet Take 81 mg by mouth daily.        Marland Kitchen b complex-vitamin c-folic acid (NEPHRO-VITE) 0.8 MG TABS Take 0.8 mg by mouth at bedtime.        . calcium acetate (PHOSLO) 667 MG capsule Take 667 mg by mouth 3 (three) times daily with meals.        . cephALEXin (KEFLEX) 250 MG capsule Take 250 mg by mouth 2 (two) times daily. Take for 7 days. First dose on 07/11/12      . clopidogrel (PLAVIX) 75 MG tablet Take 75 mg by mouth daily.        . diphenoxylate-atropine (LOMOTIL) 2.5-0.025 MG per tablet Take 1 tablet by mouth 2 (two) times daily as needed for diarrhea or loose stools.      . Multiple Vitamin (MULTIVITAMIN) capsule Take 1 capsule by mouth daily.        . pantoprazole (PROTONIX) 40 MG tablet Take 40 mg by mouth daily.        Marland Kitchen pyridOXINE (VITAMIN B-6) 100 MG tablet Take 100 mg by mouth daily.        . ranitidine (ZANTAC) 150 MG tablet Take 150 mg by  mouth at bedtime.        . sevelamer (RENVELA) 800 MG tablet Take 800 mg by mouth 3 (three) times daily with meals.         No current facility-administered medications for this visit.    REVIEW OF SYSTEMS: Arly.Keller ] denotes positive finding; [  ] denotes negative finding  CARDIOVASCULAR:  [ ]  chest pain   [ ]  chest pressure   [ ]  palpitations   [ ]  orthopnea   [ ]  dyspnea on exertion   [ ]  claudication   [ ]  rest pain   [ ]  DVT   [ ]  phlebitis PULMONARY:   [ ]  productive cough   [ ]  asthma   [ ]  wheezing NEUROLOGIC:   [ ]  weakness  [ ]  paresthesias  [ ]  aphasia  [ ]  amaurosis  [ ]  dizziness HEMATOLOGIC:   [ ]  bleeding problems   [ ]  clotting disorders MUSCULOSKELETAL:  [ ]  joint pain   [ ]  joint swelling [ ]  leg swelling GASTROINTESTINAL: [ ]   blood in stool  [ ]   hematemesis GENITOURINARY:  [ ]   dysuria  [ ]   hematuria PSYCHIATRIC:  [ ]  history of major depression INTEGUMENTARY:  [ ]  rashes  [ ]  ulcers CONSTITUTIONAL:  [ ]  fever   [ ]  chills  PHYSICAL EXAM: Filed Vitals:   12/21/12 1036  BP: 133/108  Pulse:  114  Resp: 20  Height: 5\' 5"  (1.651 m)  Weight: 154 lb (69.854 kg)   Body mass index is 25.63 kg/(m^2). GENERAL: The patient is a well-nourished female, in no acute distress. The vital signs are documented above. CARDIOVASCULAR: There is a regular rate and rhythm.  PULMONARY: There is good air exchange bilaterally without wheezing or rales. ABDOMEN: Soft and non-tender with normal pitched bowel sounds.  MUSCULOSKELETAL: There are no major deformities or cyanosis. NEUROLOGIC: No focal weakness or paresthesias are detected. SKIN: There are no ulcers or rashes noted. Her graft is somewhat pulsatile. 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 The venous outflow was noted to have a stent with elevated velocities throughout this area.  MEDICAL ISSUES: I have recommended revision of her left forearm AV graft. I would plan on replacing the venous half of the graft however I may have to extend significantly up the upper arm in order to find an adequate out flow vein given the findings on her duplex scan. One option would be to perform an intraoperative fistulogram to assess this area before making a decision on how high to revise her graft. I have explained that the graft is becoming degenerative at some point she may need a new upper arm graft. Have reviewed her previous interventional radiology procedures and she has not had any recent fistulogram to the last 2-1/2 years at [facility]. Her surgery has been scheduled for 01/06/2013. The procedure and risk were discussed with her all her questions were answered. Her family is given consent as she does have some issues with delirium and psychosis.  Corazon Nickolas S [facility]  [place] Beeper: [phone]

## 2013-01-06 NOTE — Preoperative (Signed)
Beta Blockers   Reason not to administer Beta Blockers:Not Applicable 

## 2013-01-06 NOTE — Progress Notes (Signed)
Notified Dr. Edilia Bo of patient stopping plavix x 5 days.  Patient stated she took plavix and 81 mg aspirin this am (she thought she was told to).   Dr. Edilia Bo stated it was okay.

## 2013-01-06 NOTE — Anesthesia Preprocedure Evaluation (Signed)
Anesthesia Evaluation  Patient identified by MRN, date of birth, ID band Patient awake    Reviewed: Allergy & Precautions, H&P , NPO status , Patient's Chart, lab work & pertinent test results  Airway Mallampati: II  Neck ROM: full    Dental   Pulmonary          Cardiovascular hypertension, +CHF + dysrhythmias Atrial Fibrillation     Neuro/Psych  Headaches,    GI/Hepatic PUD, GERD-  ,  Endo/Other  diabetes, Type 2  Renal/GU ESRF and DialysisRenal disease     Musculoskeletal  (+) Arthritis -,   Abdominal   Peds  Hematology   Anesthesia Other Findings   Reproductive/Obstetrics                           Anesthesia Physical Anesthesia Plan  ASA: III  Anesthesia Plan: MAC   Post-op Pain Management:    Induction: Intravenous  Airway Management Planned: Simple Face Mask  Additional Equipment:   Intra-op Plan:   Post-operative Plan:   Informed Consent: I have reviewed the patients History and Physical, chart, labs and discussed the procedure including the risks, benefits and alternatives for the proposed anesthesia with the patient or authorized representative who has indicated his/her understanding and acceptance.     Plan Discussed with: CRNA, Anesthesiologist and Surgeon  Anesthesia Plan Comments:         Anesthesia Quick Evaluation

## 2013-01-06 NOTE — Transfer of Care (Signed)
Immediate Anesthesia Transfer of Care Note  Patient: [name]  Procedure(s) Performed: Procedure(s): REVISION OF ARTERIOVENOUS GORETEX GRAFT (Left)  Patient Location: PACU  Anesthesia Type:MAC  Level of Consciousness: awake, alert  and oriented  Airway & Oxygen Therapy: Patient Spontanous Breathing and Patient connected to nasal cannula oxygen  Post-op Assessment: Report given to PACU RN  Post vital signs: Reviewed and stable  Complications: No apparent anesthesia complications

## 2013-01-10 ENCOUNTER — Encounter (HOSPITAL_COMMUNITY): Payer: Self-pay | Admitting: Vascular Surgery

## 2014-04-30 DIAGNOSIS — D631 Anemia in chronic kidney disease: Secondary | ICD-10-CM | POA: Diagnosis not present

## 2014-04-30 DIAGNOSIS — R52 Pain, unspecified: Secondary | ICD-10-CM | POA: Diagnosis not present

## 2014-04-30 DIAGNOSIS — D689 Coagulation defect, unspecified: Secondary | ICD-10-CM | POA: Diagnosis not present

## 2014-04-30 DIAGNOSIS — N186 End stage renal disease: Secondary | ICD-10-CM | POA: Diagnosis not present

## 2014-04-30 DIAGNOSIS — E876 Hypokalemia: Secondary | ICD-10-CM | POA: Diagnosis not present

## 2014-05-02 DIAGNOSIS — E876 Hypokalemia: Secondary | ICD-10-CM | POA: Diagnosis not present

## 2014-05-02 DIAGNOSIS — N186 End stage renal disease: Secondary | ICD-10-CM | POA: Diagnosis not present

## 2014-05-02 DIAGNOSIS — R52 Pain, unspecified: Secondary | ICD-10-CM | POA: Diagnosis not present

## 2014-05-02 DIAGNOSIS — D689 Coagulation defect, unspecified: Secondary | ICD-10-CM | POA: Diagnosis not present

## 2014-05-02 DIAGNOSIS — D631 Anemia in chronic kidney disease: Secondary | ICD-10-CM | POA: Diagnosis not present

## 2014-05-04 DIAGNOSIS — D631 Anemia in chronic kidney disease: Secondary | ICD-10-CM | POA: Diagnosis not present

## 2014-05-04 DIAGNOSIS — D689 Coagulation defect, unspecified: Secondary | ICD-10-CM | POA: Diagnosis not present

## 2014-05-04 DIAGNOSIS — N186 End stage renal disease: Secondary | ICD-10-CM | POA: Diagnosis not present

## 2014-05-04 DIAGNOSIS — R52 Pain, unspecified: Secondary | ICD-10-CM | POA: Diagnosis not present

## 2014-05-04 DIAGNOSIS — E876 Hypokalemia: Secondary | ICD-10-CM | POA: Diagnosis not present

## 2014-05-07 DIAGNOSIS — E876 Hypokalemia: Secondary | ICD-10-CM | POA: Diagnosis not present

## 2014-05-07 DIAGNOSIS — D631 Anemia in chronic kidney disease: Secondary | ICD-10-CM | POA: Diagnosis not present

## 2014-05-07 DIAGNOSIS — N186 End stage renal disease: Secondary | ICD-10-CM | POA: Diagnosis not present

## 2014-05-07 DIAGNOSIS — R52 Pain, unspecified: Secondary | ICD-10-CM | POA: Diagnosis not present

## 2014-05-07 DIAGNOSIS — D689 Coagulation defect, unspecified: Secondary | ICD-10-CM | POA: Diagnosis not present

## 2014-05-09 DIAGNOSIS — N186 End stage renal disease: Secondary | ICD-10-CM | POA: Diagnosis not present

## 2014-05-09 DIAGNOSIS — D631 Anemia in chronic kidney disease: Secondary | ICD-10-CM | POA: Diagnosis not present

## 2014-05-09 DIAGNOSIS — D689 Coagulation defect, unspecified: Secondary | ICD-10-CM | POA: Diagnosis not present

## 2014-05-09 DIAGNOSIS — E876 Hypokalemia: Secondary | ICD-10-CM | POA: Diagnosis not present

## 2014-05-09 DIAGNOSIS — R52 Pain, unspecified: Secondary | ICD-10-CM | POA: Diagnosis not present

## 2014-05-11 DIAGNOSIS — D689 Coagulation defect, unspecified: Secondary | ICD-10-CM | POA: Diagnosis not present

## 2014-05-11 DIAGNOSIS — N186 End stage renal disease: Secondary | ICD-10-CM | POA: Diagnosis not present

## 2014-05-11 DIAGNOSIS — E876 Hypokalemia: Secondary | ICD-10-CM | POA: Diagnosis not present

## 2014-05-11 DIAGNOSIS — D631 Anemia in chronic kidney disease: Secondary | ICD-10-CM | POA: Diagnosis not present

## 2014-05-11 DIAGNOSIS — R52 Pain, unspecified: Secondary | ICD-10-CM | POA: Diagnosis not present

## 2014-05-16 DIAGNOSIS — D689 Coagulation defect, unspecified: Secondary | ICD-10-CM | POA: Diagnosis not present

## 2014-05-16 DIAGNOSIS — N186 End stage renal disease: Secondary | ICD-10-CM | POA: Diagnosis not present

## 2014-05-16 DIAGNOSIS — R52 Pain, unspecified: Secondary | ICD-10-CM | POA: Diagnosis not present

## 2014-05-16 DIAGNOSIS — E876 Hypokalemia: Secondary | ICD-10-CM | POA: Diagnosis not present

## 2014-05-16 DIAGNOSIS — D631 Anemia in chronic kidney disease: Secondary | ICD-10-CM | POA: Diagnosis not present

## 2014-05-22 DIAGNOSIS — N186 End stage renal disease: Secondary | ICD-10-CM | POA: Diagnosis not present

## 2014-05-22 DIAGNOSIS — R52 Pain, unspecified: Secondary | ICD-10-CM | POA: Diagnosis not present

## 2014-05-22 DIAGNOSIS — D689 Coagulation defect, unspecified: Secondary | ICD-10-CM | POA: Diagnosis not present

## 2014-05-22 DIAGNOSIS — E876 Hypokalemia: Secondary | ICD-10-CM | POA: Diagnosis not present

## 2014-05-22 DIAGNOSIS — D631 Anemia in chronic kidney disease: Secondary | ICD-10-CM | POA: Diagnosis not present

## 2014-05-25 DIAGNOSIS — E876 Hypokalemia: Secondary | ICD-10-CM | POA: Diagnosis not present

## 2014-05-25 DIAGNOSIS — D631 Anemia in chronic kidney disease: Secondary | ICD-10-CM | POA: Diagnosis not present

## 2014-05-25 DIAGNOSIS — D689 Coagulation defect, unspecified: Secondary | ICD-10-CM | POA: Diagnosis not present

## 2014-05-25 DIAGNOSIS — R52 Pain, unspecified: Secondary | ICD-10-CM | POA: Diagnosis not present

## 2014-05-25 DIAGNOSIS — N186 End stage renal disease: Secondary | ICD-10-CM | POA: Diagnosis not present

## 2014-05-27 DIAGNOSIS — Z992 Dependence on renal dialysis: Secondary | ICD-10-CM | POA: Diagnosis not present

## 2014-05-27 DIAGNOSIS — N186 End stage renal disease: Secondary | ICD-10-CM | POA: Diagnosis not present

## 2014-05-28 DIAGNOSIS — D631 Anemia in chronic kidney disease: Secondary | ICD-10-CM | POA: Diagnosis not present

## 2014-05-28 DIAGNOSIS — N186 End stage renal disease: Secondary | ICD-10-CM | POA: Diagnosis not present

## 2014-05-28 DIAGNOSIS — N2581 Secondary hyperparathyroidism of renal origin: Secondary | ICD-10-CM | POA: Diagnosis not present

## 2014-05-28 DIAGNOSIS — D689 Coagulation defect, unspecified: Secondary | ICD-10-CM | POA: Diagnosis not present

## 2014-05-28 DIAGNOSIS — E876 Hypokalemia: Secondary | ICD-10-CM | POA: Diagnosis not present

## 2014-05-28 DIAGNOSIS — D509 Iron deficiency anemia, unspecified: Secondary | ICD-10-CM | POA: Diagnosis not present

## 2014-05-28 DIAGNOSIS — R52 Pain, unspecified: Secondary | ICD-10-CM | POA: Diagnosis not present

## 2014-05-30 DIAGNOSIS — N2581 Secondary hyperparathyroidism of renal origin: Secondary | ICD-10-CM | POA: Diagnosis not present

## 2014-05-30 DIAGNOSIS — R52 Pain, unspecified: Secondary | ICD-10-CM | POA: Diagnosis not present

## 2014-05-30 DIAGNOSIS — N186 End stage renal disease: Secondary | ICD-10-CM | POA: Diagnosis not present

## 2014-05-30 DIAGNOSIS — D689 Coagulation defect, unspecified: Secondary | ICD-10-CM | POA: Diagnosis not present

## 2014-05-30 DIAGNOSIS — E876 Hypokalemia: Secondary | ICD-10-CM | POA: Diagnosis not present

## 2014-05-30 DIAGNOSIS — D631 Anemia in chronic kidney disease: Secondary | ICD-10-CM | POA: Diagnosis not present

## 2014-05-30 DIAGNOSIS — D509 Iron deficiency anemia, unspecified: Secondary | ICD-10-CM | POA: Diagnosis not present

## 2014-06-01 DIAGNOSIS — D509 Iron deficiency anemia, unspecified: Secondary | ICD-10-CM | POA: Diagnosis not present

## 2014-06-01 DIAGNOSIS — N2581 Secondary hyperparathyroidism of renal origin: Secondary | ICD-10-CM | POA: Diagnosis not present

## 2014-06-01 DIAGNOSIS — N186 End stage renal disease: Secondary | ICD-10-CM | POA: Diagnosis not present

## 2014-06-01 DIAGNOSIS — E876 Hypokalemia: Secondary | ICD-10-CM | POA: Diagnosis not present

## 2014-06-01 DIAGNOSIS — R52 Pain, unspecified: Secondary | ICD-10-CM | POA: Diagnosis not present

## 2014-06-01 DIAGNOSIS — D631 Anemia in chronic kidney disease: Secondary | ICD-10-CM | POA: Diagnosis not present

## 2014-06-01 DIAGNOSIS — D689 Coagulation defect, unspecified: Secondary | ICD-10-CM | POA: Diagnosis not present

## 2014-06-04 DIAGNOSIS — D509 Iron deficiency anemia, unspecified: Secondary | ICD-10-CM | POA: Diagnosis not present

## 2014-06-04 DIAGNOSIS — R52 Pain, unspecified: Secondary | ICD-10-CM | POA: Diagnosis not present

## 2014-06-04 DIAGNOSIS — D631 Anemia in chronic kidney disease: Secondary | ICD-10-CM | POA: Diagnosis not present

## 2014-06-04 DIAGNOSIS — E876 Hypokalemia: Secondary | ICD-10-CM | POA: Diagnosis not present

## 2014-06-04 DIAGNOSIS — D689 Coagulation defect, unspecified: Secondary | ICD-10-CM | POA: Diagnosis not present

## 2014-06-04 DIAGNOSIS — N2581 Secondary hyperparathyroidism of renal origin: Secondary | ICD-10-CM | POA: Diagnosis not present

## 2014-06-04 DIAGNOSIS — N186 End stage renal disease: Secondary | ICD-10-CM | POA: Diagnosis not present

## 2014-06-06 DIAGNOSIS — R52 Pain, unspecified: Secondary | ICD-10-CM | POA: Diagnosis not present

## 2014-06-06 DIAGNOSIS — D631 Anemia in chronic kidney disease: Secondary | ICD-10-CM | POA: Diagnosis not present

## 2014-06-06 DIAGNOSIS — N186 End stage renal disease: Secondary | ICD-10-CM | POA: Diagnosis not present

## 2014-06-06 DIAGNOSIS — N2581 Secondary hyperparathyroidism of renal origin: Secondary | ICD-10-CM | POA: Diagnosis not present

## 2014-06-06 DIAGNOSIS — E876 Hypokalemia: Secondary | ICD-10-CM | POA: Diagnosis not present

## 2014-06-06 DIAGNOSIS — D509 Iron deficiency anemia, unspecified: Secondary | ICD-10-CM | POA: Diagnosis not present

## 2014-06-06 DIAGNOSIS — D689 Coagulation defect, unspecified: Secondary | ICD-10-CM | POA: Diagnosis not present

## 2014-06-08 DIAGNOSIS — D689 Coagulation defect, unspecified: Secondary | ICD-10-CM | POA: Diagnosis not present

## 2014-06-08 DIAGNOSIS — E876 Hypokalemia: Secondary | ICD-10-CM | POA: Diagnosis not present

## 2014-06-08 DIAGNOSIS — D631 Anemia in chronic kidney disease: Secondary | ICD-10-CM | POA: Diagnosis not present

## 2014-06-08 DIAGNOSIS — R52 Pain, unspecified: Secondary | ICD-10-CM | POA: Diagnosis not present

## 2014-06-08 DIAGNOSIS — N186 End stage renal disease: Secondary | ICD-10-CM | POA: Diagnosis not present

## 2014-06-08 DIAGNOSIS — N2581 Secondary hyperparathyroidism of renal origin: Secondary | ICD-10-CM | POA: Diagnosis not present

## 2014-06-08 DIAGNOSIS — D509 Iron deficiency anemia, unspecified: Secondary | ICD-10-CM | POA: Diagnosis not present

## 2014-06-13 DIAGNOSIS — D689 Coagulation defect, unspecified: Secondary | ICD-10-CM | POA: Diagnosis not present

## 2014-06-13 DIAGNOSIS — D509 Iron deficiency anemia, unspecified: Secondary | ICD-10-CM | POA: Diagnosis not present

## 2014-06-13 DIAGNOSIS — R52 Pain, unspecified: Secondary | ICD-10-CM | POA: Diagnosis not present

## 2014-06-13 DIAGNOSIS — N2581 Secondary hyperparathyroidism of renal origin: Secondary | ICD-10-CM | POA: Diagnosis not present

## 2014-06-13 DIAGNOSIS — D631 Anemia in chronic kidney disease: Secondary | ICD-10-CM | POA: Diagnosis not present

## 2014-06-13 DIAGNOSIS — E876 Hypokalemia: Secondary | ICD-10-CM | POA: Diagnosis not present

## 2014-06-13 DIAGNOSIS — N186 End stage renal disease: Secondary | ICD-10-CM | POA: Diagnosis not present

## 2014-06-15 DIAGNOSIS — D689 Coagulation defect, unspecified: Secondary | ICD-10-CM | POA: Diagnosis not present

## 2014-06-15 DIAGNOSIS — N186 End stage renal disease: Secondary | ICD-10-CM | POA: Diagnosis not present

## 2014-06-15 DIAGNOSIS — D509 Iron deficiency anemia, unspecified: Secondary | ICD-10-CM | POA: Diagnosis not present

## 2014-06-15 DIAGNOSIS — D631 Anemia in chronic kidney disease: Secondary | ICD-10-CM | POA: Diagnosis not present

## 2014-06-15 DIAGNOSIS — E876 Hypokalemia: Secondary | ICD-10-CM | POA: Diagnosis not present

## 2014-06-15 DIAGNOSIS — R52 Pain, unspecified: Secondary | ICD-10-CM | POA: Diagnosis not present

## 2014-06-15 DIAGNOSIS — N2581 Secondary hyperparathyroidism of renal origin: Secondary | ICD-10-CM | POA: Diagnosis not present

## 2014-06-18 DIAGNOSIS — D509 Iron deficiency anemia, unspecified: Secondary | ICD-10-CM | POA: Diagnosis not present

## 2014-06-18 DIAGNOSIS — M109 Gout, unspecified: Secondary | ICD-10-CM | POA: Diagnosis not present

## 2014-06-18 DIAGNOSIS — E876 Hypokalemia: Secondary | ICD-10-CM | POA: Diagnosis not present

## 2014-06-18 DIAGNOSIS — N186 End stage renal disease: Secondary | ICD-10-CM | POA: Diagnosis not present

## 2014-06-18 DIAGNOSIS — N2581 Secondary hyperparathyroidism of renal origin: Secondary | ICD-10-CM | POA: Diagnosis not present

## 2014-06-18 DIAGNOSIS — D689 Coagulation defect, unspecified: Secondary | ICD-10-CM | POA: Diagnosis not present

## 2014-06-18 DIAGNOSIS — R52 Pain, unspecified: Secondary | ICD-10-CM | POA: Diagnosis not present

## 2014-06-18 DIAGNOSIS — D631 Anemia in chronic kidney disease: Secondary | ICD-10-CM | POA: Diagnosis not present

## 2014-06-19 DIAGNOSIS — M109 Gout, unspecified: Secondary | ICD-10-CM | POA: Diagnosis not present

## 2014-06-20 DIAGNOSIS — N2581 Secondary hyperparathyroidism of renal origin: Secondary | ICD-10-CM | POA: Diagnosis not present

## 2014-06-20 DIAGNOSIS — D631 Anemia in chronic kidney disease: Secondary | ICD-10-CM | POA: Diagnosis not present

## 2014-06-20 DIAGNOSIS — D509 Iron deficiency anemia, unspecified: Secondary | ICD-10-CM | POA: Diagnosis not present

## 2014-06-20 DIAGNOSIS — D689 Coagulation defect, unspecified: Secondary | ICD-10-CM | POA: Diagnosis not present

## 2014-06-20 DIAGNOSIS — N186 End stage renal disease: Secondary | ICD-10-CM | POA: Diagnosis not present

## 2014-06-20 DIAGNOSIS — M109 Gout, unspecified: Secondary | ICD-10-CM | POA: Diagnosis not present

## 2014-06-20 DIAGNOSIS — E876 Hypokalemia: Secondary | ICD-10-CM | POA: Diagnosis not present

## 2014-06-20 DIAGNOSIS — R52 Pain, unspecified: Secondary | ICD-10-CM | POA: Diagnosis not present

## 2014-06-21 DIAGNOSIS — M109 Gout, unspecified: Secondary | ICD-10-CM | POA: Diagnosis not present

## 2014-06-22 DIAGNOSIS — M109 Gout, unspecified: Secondary | ICD-10-CM | POA: Diagnosis not present

## 2014-06-22 DIAGNOSIS — N2581 Secondary hyperparathyroidism of renal origin: Secondary | ICD-10-CM | POA: Diagnosis not present

## 2014-06-22 DIAGNOSIS — D631 Anemia in chronic kidney disease: Secondary | ICD-10-CM | POA: Diagnosis not present

## 2014-06-22 DIAGNOSIS — E876 Hypokalemia: Secondary | ICD-10-CM | POA: Diagnosis not present

## 2014-06-22 DIAGNOSIS — N186 End stage renal disease: Secondary | ICD-10-CM | POA: Diagnosis not present

## 2014-06-22 DIAGNOSIS — D509 Iron deficiency anemia, unspecified: Secondary | ICD-10-CM | POA: Diagnosis not present

## 2014-06-22 DIAGNOSIS — D689 Coagulation defect, unspecified: Secondary | ICD-10-CM | POA: Diagnosis not present

## 2014-06-22 DIAGNOSIS — R52 Pain, unspecified: Secondary | ICD-10-CM | POA: Diagnosis not present

## 2014-06-25 DIAGNOSIS — D689 Coagulation defect, unspecified: Secondary | ICD-10-CM | POA: Diagnosis not present

## 2014-06-25 DIAGNOSIS — N186 End stage renal disease: Secondary | ICD-10-CM | POA: Diagnosis not present

## 2014-06-25 DIAGNOSIS — R52 Pain, unspecified: Secondary | ICD-10-CM | POA: Diagnosis not present

## 2014-06-25 DIAGNOSIS — D509 Iron deficiency anemia, unspecified: Secondary | ICD-10-CM | POA: Diagnosis not present

## 2014-06-25 DIAGNOSIS — N2581 Secondary hyperparathyroidism of renal origin: Secondary | ICD-10-CM | POA: Diagnosis not present

## 2014-06-25 DIAGNOSIS — E876 Hypokalemia: Secondary | ICD-10-CM | POA: Diagnosis not present

## 2014-06-25 DIAGNOSIS — D631 Anemia in chronic kidney disease: Secondary | ICD-10-CM | POA: Diagnosis not present

## 2014-06-25 DIAGNOSIS — Z992 Dependence on renal dialysis: Secondary | ICD-10-CM | POA: Diagnosis not present

## 2014-06-26 DIAGNOSIS — M109 Gout, unspecified: Secondary | ICD-10-CM | POA: Diagnosis not present

## 2014-06-27 DIAGNOSIS — M109 Gout, unspecified: Secondary | ICD-10-CM | POA: Diagnosis not present

## 2014-06-27 DIAGNOSIS — N186 End stage renal disease: Secondary | ICD-10-CM | POA: Diagnosis not present

## 2014-06-27 DIAGNOSIS — D631 Anemia in chronic kidney disease: Secondary | ICD-10-CM | POA: Diagnosis not present

## 2014-06-27 DIAGNOSIS — R52 Pain, unspecified: Secondary | ICD-10-CM | POA: Diagnosis not present

## 2014-06-27 DIAGNOSIS — D509 Iron deficiency anemia, unspecified: Secondary | ICD-10-CM | POA: Diagnosis not present

## 2014-06-27 DIAGNOSIS — E876 Hypokalemia: Secondary | ICD-10-CM | POA: Diagnosis not present

## 2014-06-27 DIAGNOSIS — L299 Pruritus, unspecified: Secondary | ICD-10-CM | POA: Diagnosis not present

## 2014-06-27 DIAGNOSIS — D689 Coagulation defect, unspecified: Secondary | ICD-10-CM | POA: Diagnosis not present

## 2014-06-28 DIAGNOSIS — M109 Gout, unspecified: Secondary | ICD-10-CM | POA: Diagnosis not present

## 2014-06-29 DIAGNOSIS — R52 Pain, unspecified: Secondary | ICD-10-CM | POA: Diagnosis not present

## 2014-06-29 DIAGNOSIS — D689 Coagulation defect, unspecified: Secondary | ICD-10-CM | POA: Diagnosis not present

## 2014-06-29 DIAGNOSIS — E876 Hypokalemia: Secondary | ICD-10-CM | POA: Diagnosis not present

## 2014-06-29 DIAGNOSIS — D509 Iron deficiency anemia, unspecified: Secondary | ICD-10-CM | POA: Diagnosis not present

## 2014-06-29 DIAGNOSIS — M109 Gout, unspecified: Secondary | ICD-10-CM | POA: Diagnosis not present

## 2014-06-29 DIAGNOSIS — N186 End stage renal disease: Secondary | ICD-10-CM | POA: Diagnosis not present

## 2014-06-29 DIAGNOSIS — D631 Anemia in chronic kidney disease: Secondary | ICD-10-CM | POA: Diagnosis not present

## 2014-06-29 DIAGNOSIS — L299 Pruritus, unspecified: Secondary | ICD-10-CM | POA: Diagnosis not present

## 2014-07-02 DIAGNOSIS — M109 Gout, unspecified: Secondary | ICD-10-CM | POA: Diagnosis not present

## 2014-07-02 DIAGNOSIS — R52 Pain, unspecified: Secondary | ICD-10-CM | POA: Diagnosis not present

## 2014-07-02 DIAGNOSIS — D689 Coagulation defect, unspecified: Secondary | ICD-10-CM | POA: Diagnosis not present

## 2014-07-02 DIAGNOSIS — D509 Iron deficiency anemia, unspecified: Secondary | ICD-10-CM | POA: Diagnosis not present

## 2014-07-02 DIAGNOSIS — L299 Pruritus, unspecified: Secondary | ICD-10-CM | POA: Diagnosis not present

## 2014-07-02 DIAGNOSIS — E876 Hypokalemia: Secondary | ICD-10-CM | POA: Diagnosis not present

## 2014-07-02 DIAGNOSIS — N186 End stage renal disease: Secondary | ICD-10-CM | POA: Diagnosis not present

## 2014-07-02 DIAGNOSIS — D631 Anemia in chronic kidney disease: Secondary | ICD-10-CM | POA: Diagnosis not present

## 2014-07-03 DIAGNOSIS — M109 Gout, unspecified: Secondary | ICD-10-CM | POA: Diagnosis not present

## 2014-07-04 DIAGNOSIS — N186 End stage renal disease: Secondary | ICD-10-CM | POA: Diagnosis not present

## 2014-07-04 DIAGNOSIS — D689 Coagulation defect, unspecified: Secondary | ICD-10-CM | POA: Diagnosis not present

## 2014-07-04 DIAGNOSIS — D631 Anemia in chronic kidney disease: Secondary | ICD-10-CM | POA: Diagnosis not present

## 2014-07-04 DIAGNOSIS — D509 Iron deficiency anemia, unspecified: Secondary | ICD-10-CM | POA: Diagnosis not present

## 2014-07-04 DIAGNOSIS — R52 Pain, unspecified: Secondary | ICD-10-CM | POA: Diagnosis not present

## 2014-07-04 DIAGNOSIS — M109 Gout, unspecified: Secondary | ICD-10-CM | POA: Diagnosis not present

## 2014-07-04 DIAGNOSIS — E876 Hypokalemia: Secondary | ICD-10-CM | POA: Diagnosis not present

## 2014-07-04 DIAGNOSIS — L299 Pruritus, unspecified: Secondary | ICD-10-CM | POA: Diagnosis not present

## 2014-07-05 DIAGNOSIS — M109 Gout, unspecified: Secondary | ICD-10-CM | POA: Diagnosis not present

## 2014-07-06 DIAGNOSIS — E876 Hypokalemia: Secondary | ICD-10-CM | POA: Diagnosis not present

## 2014-07-06 DIAGNOSIS — L299 Pruritus, unspecified: Secondary | ICD-10-CM | POA: Diagnosis not present

## 2014-07-06 DIAGNOSIS — M109 Gout, unspecified: Secondary | ICD-10-CM | POA: Diagnosis not present

## 2014-07-06 DIAGNOSIS — D509 Iron deficiency anemia, unspecified: Secondary | ICD-10-CM | POA: Diagnosis not present

## 2014-07-06 DIAGNOSIS — N186 End stage renal disease: Secondary | ICD-10-CM | POA: Diagnosis not present

## 2014-07-06 DIAGNOSIS — R52 Pain, unspecified: Secondary | ICD-10-CM | POA: Diagnosis not present

## 2014-07-06 DIAGNOSIS — D631 Anemia in chronic kidney disease: Secondary | ICD-10-CM | POA: Diagnosis not present

## 2014-07-06 DIAGNOSIS — D689 Coagulation defect, unspecified: Secondary | ICD-10-CM | POA: Diagnosis not present

## 2014-07-09 DIAGNOSIS — E876 Hypokalemia: Secondary | ICD-10-CM | POA: Diagnosis not present

## 2014-07-09 DIAGNOSIS — M109 Gout, unspecified: Secondary | ICD-10-CM | POA: Diagnosis not present

## 2014-07-09 DIAGNOSIS — D509 Iron deficiency anemia, unspecified: Secondary | ICD-10-CM | POA: Diagnosis not present

## 2014-07-09 DIAGNOSIS — R52 Pain, unspecified: Secondary | ICD-10-CM | POA: Diagnosis not present

## 2014-07-09 DIAGNOSIS — N186 End stage renal disease: Secondary | ICD-10-CM | POA: Diagnosis not present

## 2014-07-09 DIAGNOSIS — D689 Coagulation defect, unspecified: Secondary | ICD-10-CM | POA: Diagnosis not present

## 2014-07-09 DIAGNOSIS — D631 Anemia in chronic kidney disease: Secondary | ICD-10-CM | POA: Diagnosis not present

## 2014-07-09 DIAGNOSIS — L299 Pruritus, unspecified: Secondary | ICD-10-CM | POA: Diagnosis not present

## 2014-07-10 DIAGNOSIS — M109 Gout, unspecified: Secondary | ICD-10-CM | POA: Diagnosis not present

## 2014-07-11 DIAGNOSIS — E876 Hypokalemia: Secondary | ICD-10-CM | POA: Diagnosis not present

## 2014-07-11 DIAGNOSIS — R52 Pain, unspecified: Secondary | ICD-10-CM | POA: Diagnosis not present

## 2014-07-11 DIAGNOSIS — D689 Coagulation defect, unspecified: Secondary | ICD-10-CM | POA: Diagnosis not present

## 2014-07-11 DIAGNOSIS — D509 Iron deficiency anemia, unspecified: Secondary | ICD-10-CM | POA: Diagnosis not present

## 2014-07-11 DIAGNOSIS — M109 Gout, unspecified: Secondary | ICD-10-CM | POA: Diagnosis not present

## 2014-07-11 DIAGNOSIS — L299 Pruritus, unspecified: Secondary | ICD-10-CM | POA: Diagnosis not present

## 2014-07-11 DIAGNOSIS — N186 End stage renal disease: Secondary | ICD-10-CM | POA: Diagnosis not present

## 2014-07-11 DIAGNOSIS — D631 Anemia in chronic kidney disease: Secondary | ICD-10-CM | POA: Diagnosis not present

## 2014-07-12 DIAGNOSIS — M109 Gout, unspecified: Secondary | ICD-10-CM | POA: Diagnosis not present

## 2014-07-13 DIAGNOSIS — D509 Iron deficiency anemia, unspecified: Secondary | ICD-10-CM | POA: Diagnosis not present

## 2014-07-13 DIAGNOSIS — D689 Coagulation defect, unspecified: Secondary | ICD-10-CM | POA: Diagnosis not present

## 2014-07-13 DIAGNOSIS — E876 Hypokalemia: Secondary | ICD-10-CM | POA: Diagnosis not present

## 2014-07-13 DIAGNOSIS — M109 Gout, unspecified: Secondary | ICD-10-CM | POA: Diagnosis not present

## 2014-07-13 DIAGNOSIS — D631 Anemia in chronic kidney disease: Secondary | ICD-10-CM | POA: Diagnosis not present

## 2014-07-13 DIAGNOSIS — R52 Pain, unspecified: Secondary | ICD-10-CM | POA: Diagnosis not present

## 2014-07-13 DIAGNOSIS — L299 Pruritus, unspecified: Secondary | ICD-10-CM | POA: Diagnosis not present

## 2014-07-13 DIAGNOSIS — N186 End stage renal disease: Secondary | ICD-10-CM | POA: Diagnosis not present

## 2014-07-16 DIAGNOSIS — N186 End stage renal disease: Secondary | ICD-10-CM | POA: Diagnosis not present

## 2014-07-16 DIAGNOSIS — M109 Gout, unspecified: Secondary | ICD-10-CM | POA: Diagnosis not present

## 2014-07-16 DIAGNOSIS — D689 Coagulation defect, unspecified: Secondary | ICD-10-CM | POA: Diagnosis not present

## 2014-07-16 DIAGNOSIS — E876 Hypokalemia: Secondary | ICD-10-CM | POA: Diagnosis not present

## 2014-07-16 DIAGNOSIS — R52 Pain, unspecified: Secondary | ICD-10-CM | POA: Diagnosis not present

## 2014-07-16 DIAGNOSIS — D509 Iron deficiency anemia, unspecified: Secondary | ICD-10-CM | POA: Diagnosis not present

## 2014-07-16 DIAGNOSIS — D631 Anemia in chronic kidney disease: Secondary | ICD-10-CM | POA: Diagnosis not present

## 2014-07-16 DIAGNOSIS — L299 Pruritus, unspecified: Secondary | ICD-10-CM | POA: Diagnosis not present

## 2014-07-17 DIAGNOSIS — M109 Gout, unspecified: Secondary | ICD-10-CM | POA: Diagnosis not present

## 2014-07-18 DIAGNOSIS — L299 Pruritus, unspecified: Secondary | ICD-10-CM | POA: Diagnosis not present

## 2014-07-18 DIAGNOSIS — M109 Gout, unspecified: Secondary | ICD-10-CM | POA: Diagnosis not present

## 2014-07-18 DIAGNOSIS — R52 Pain, unspecified: Secondary | ICD-10-CM | POA: Diagnosis not present

## 2014-07-18 DIAGNOSIS — D631 Anemia in chronic kidney disease: Secondary | ICD-10-CM | POA: Diagnosis not present

## 2014-07-18 DIAGNOSIS — N186 End stage renal disease: Secondary | ICD-10-CM | POA: Diagnosis not present

## 2014-07-18 DIAGNOSIS — D689 Coagulation defect, unspecified: Secondary | ICD-10-CM | POA: Diagnosis not present

## 2014-07-18 DIAGNOSIS — E876 Hypokalemia: Secondary | ICD-10-CM | POA: Diagnosis not present

## 2014-07-18 DIAGNOSIS — D509 Iron deficiency anemia, unspecified: Secondary | ICD-10-CM | POA: Diagnosis not present

## 2014-07-19 DIAGNOSIS — M109 Gout, unspecified: Secondary | ICD-10-CM | POA: Diagnosis not present

## 2014-07-20 DIAGNOSIS — M109 Gout, unspecified: Secondary | ICD-10-CM | POA: Diagnosis not present

## 2014-07-23 DIAGNOSIS — D689 Coagulation defect, unspecified: Secondary | ICD-10-CM | POA: Diagnosis not present

## 2014-07-23 DIAGNOSIS — D509 Iron deficiency anemia, unspecified: Secondary | ICD-10-CM | POA: Diagnosis not present

## 2014-07-23 DIAGNOSIS — E876 Hypokalemia: Secondary | ICD-10-CM | POA: Diagnosis not present

## 2014-07-23 DIAGNOSIS — L299 Pruritus, unspecified: Secondary | ICD-10-CM | POA: Diagnosis not present

## 2014-07-23 DIAGNOSIS — N186 End stage renal disease: Secondary | ICD-10-CM | POA: Diagnosis not present

## 2014-07-23 DIAGNOSIS — D631 Anemia in chronic kidney disease: Secondary | ICD-10-CM | POA: Diagnosis not present

## 2014-07-23 DIAGNOSIS — M109 Gout, unspecified: Secondary | ICD-10-CM | POA: Diagnosis not present

## 2014-07-23 DIAGNOSIS — R52 Pain, unspecified: Secondary | ICD-10-CM | POA: Diagnosis not present

## 2014-07-24 DIAGNOSIS — M109 Gout, unspecified: Secondary | ICD-10-CM | POA: Diagnosis not present

## 2014-07-25 DIAGNOSIS — D509 Iron deficiency anemia, unspecified: Secondary | ICD-10-CM | POA: Diagnosis not present

## 2014-07-25 DIAGNOSIS — L299 Pruritus, unspecified: Secondary | ICD-10-CM | POA: Diagnosis not present

## 2014-07-25 DIAGNOSIS — N186 End stage renal disease: Secondary | ICD-10-CM | POA: Diagnosis not present

## 2014-07-25 DIAGNOSIS — E876 Hypokalemia: Secondary | ICD-10-CM | POA: Diagnosis not present

## 2014-07-25 DIAGNOSIS — R52 Pain, unspecified: Secondary | ICD-10-CM | POA: Diagnosis not present

## 2014-07-25 DIAGNOSIS — M109 Gout, unspecified: Secondary | ICD-10-CM | POA: Diagnosis not present

## 2014-07-25 DIAGNOSIS — D631 Anemia in chronic kidney disease: Secondary | ICD-10-CM | POA: Diagnosis not present

## 2014-07-25 DIAGNOSIS — D689 Coagulation defect, unspecified: Secondary | ICD-10-CM | POA: Diagnosis not present

## 2014-07-26 DIAGNOSIS — M109 Gout, unspecified: Secondary | ICD-10-CM | POA: Diagnosis not present

## 2014-07-26 DIAGNOSIS — I120 Hypertensive chronic kidney disease with stage 5 chronic kidney disease or end stage renal disease: Secondary | ICD-10-CM | POA: Diagnosis not present

## 2014-07-26 DIAGNOSIS — Z992 Dependence on renal dialysis: Secondary | ICD-10-CM | POA: Diagnosis not present

## 2014-07-26 DIAGNOSIS — N186 End stage renal disease: Secondary | ICD-10-CM | POA: Diagnosis not present

## 2014-07-27 DIAGNOSIS — M109 Gout, unspecified: Secondary | ICD-10-CM | POA: Diagnosis not present

## 2014-07-30 DIAGNOSIS — M109 Gout, unspecified: Secondary | ICD-10-CM | POA: Diagnosis not present

## 2014-07-30 DIAGNOSIS — D509 Iron deficiency anemia, unspecified: Secondary | ICD-10-CM | POA: Diagnosis not present

## 2014-07-30 DIAGNOSIS — R52 Pain, unspecified: Secondary | ICD-10-CM | POA: Diagnosis not present

## 2014-07-30 DIAGNOSIS — E876 Hypokalemia: Secondary | ICD-10-CM | POA: Diagnosis not present

## 2014-07-30 DIAGNOSIS — N186 End stage renal disease: Secondary | ICD-10-CM | POA: Diagnosis not present

## 2014-07-30 DIAGNOSIS — D689 Coagulation defect, unspecified: Secondary | ICD-10-CM | POA: Diagnosis not present

## 2014-07-30 DIAGNOSIS — D631 Anemia in chronic kidney disease: Secondary | ICD-10-CM | POA: Diagnosis not present

## 2014-07-31 DIAGNOSIS — M109 Gout, unspecified: Secondary | ICD-10-CM | POA: Diagnosis not present

## 2014-08-01 DIAGNOSIS — R52 Pain, unspecified: Secondary | ICD-10-CM | POA: Diagnosis not present

## 2014-08-01 DIAGNOSIS — D631 Anemia in chronic kidney disease: Secondary | ICD-10-CM | POA: Diagnosis not present

## 2014-08-01 DIAGNOSIS — D689 Coagulation defect, unspecified: Secondary | ICD-10-CM | POA: Diagnosis not present

## 2014-08-01 DIAGNOSIS — N186 End stage renal disease: Secondary | ICD-10-CM | POA: Diagnosis not present

## 2014-08-01 DIAGNOSIS — M109 Gout, unspecified: Secondary | ICD-10-CM | POA: Diagnosis not present

## 2014-08-01 DIAGNOSIS — D509 Iron deficiency anemia, unspecified: Secondary | ICD-10-CM | POA: Diagnosis not present

## 2014-08-01 DIAGNOSIS — E876 Hypokalemia: Secondary | ICD-10-CM | POA: Diagnosis not present

## 2014-08-02 DIAGNOSIS — M109 Gout, unspecified: Secondary | ICD-10-CM | POA: Diagnosis not present

## 2014-08-02 DIAGNOSIS — E1129 Type 2 diabetes mellitus with other diabetic kidney complication: Secondary | ICD-10-CM | POA: Diagnosis not present

## 2014-08-02 DIAGNOSIS — E785 Hyperlipidemia, unspecified: Secondary | ICD-10-CM | POA: Diagnosis not present

## 2014-08-02 DIAGNOSIS — I10 Essential (primary) hypertension: Secondary | ICD-10-CM | POA: Diagnosis not present

## 2014-08-02 DIAGNOSIS — Z0000 Encounter for general adult medical examination without abnormal findings: Secondary | ICD-10-CM | POA: Diagnosis not present

## 2014-08-03 DIAGNOSIS — N186 End stage renal disease: Secondary | ICD-10-CM | POA: Diagnosis not present

## 2014-08-03 DIAGNOSIS — D509 Iron deficiency anemia, unspecified: Secondary | ICD-10-CM | POA: Diagnosis not present

## 2014-08-03 DIAGNOSIS — R52 Pain, unspecified: Secondary | ICD-10-CM | POA: Diagnosis not present

## 2014-08-03 DIAGNOSIS — M109 Gout, unspecified: Secondary | ICD-10-CM | POA: Diagnosis not present

## 2014-08-03 DIAGNOSIS — D689 Coagulation defect, unspecified: Secondary | ICD-10-CM | POA: Diagnosis not present

## 2014-08-03 DIAGNOSIS — D631 Anemia in chronic kidney disease: Secondary | ICD-10-CM | POA: Diagnosis not present

## 2014-08-03 DIAGNOSIS — E876 Hypokalemia: Secondary | ICD-10-CM | POA: Diagnosis not present

## 2014-08-06 DIAGNOSIS — R52 Pain, unspecified: Secondary | ICD-10-CM | POA: Diagnosis not present

## 2014-08-06 DIAGNOSIS — M109 Gout, unspecified: Secondary | ICD-10-CM | POA: Diagnosis not present

## 2014-08-06 DIAGNOSIS — D689 Coagulation defect, unspecified: Secondary | ICD-10-CM | POA: Diagnosis not present

## 2014-08-06 DIAGNOSIS — E876 Hypokalemia: Secondary | ICD-10-CM | POA: Diagnosis not present

## 2014-08-06 DIAGNOSIS — N186 End stage renal disease: Secondary | ICD-10-CM | POA: Diagnosis not present

## 2014-08-06 DIAGNOSIS — D509 Iron deficiency anemia, unspecified: Secondary | ICD-10-CM | POA: Diagnosis not present

## 2014-08-06 DIAGNOSIS — D631 Anemia in chronic kidney disease: Secondary | ICD-10-CM | POA: Diagnosis not present

## 2014-08-07 DIAGNOSIS — M109 Gout, unspecified: Secondary | ICD-10-CM | POA: Diagnosis not present

## 2014-08-08 DIAGNOSIS — M109 Gout, unspecified: Secondary | ICD-10-CM | POA: Diagnosis not present

## 2014-08-08 DIAGNOSIS — E876 Hypokalemia: Secondary | ICD-10-CM | POA: Diagnosis not present

## 2014-08-08 DIAGNOSIS — D631 Anemia in chronic kidney disease: Secondary | ICD-10-CM | POA: Diagnosis not present

## 2014-08-08 DIAGNOSIS — R52 Pain, unspecified: Secondary | ICD-10-CM | POA: Diagnosis not present

## 2014-08-08 DIAGNOSIS — D509 Iron deficiency anemia, unspecified: Secondary | ICD-10-CM | POA: Diagnosis not present

## 2014-08-08 DIAGNOSIS — N186 End stage renal disease: Secondary | ICD-10-CM | POA: Diagnosis not present

## 2014-08-08 DIAGNOSIS — D689 Coagulation defect, unspecified: Secondary | ICD-10-CM | POA: Diagnosis not present

## 2014-08-09 DIAGNOSIS — M109 Gout, unspecified: Secondary | ICD-10-CM | POA: Diagnosis not present

## 2014-08-10 DIAGNOSIS — N186 End stage renal disease: Secondary | ICD-10-CM | POA: Diagnosis not present

## 2014-08-10 DIAGNOSIS — D689 Coagulation defect, unspecified: Secondary | ICD-10-CM | POA: Diagnosis not present

## 2014-08-10 DIAGNOSIS — D631 Anemia in chronic kidney disease: Secondary | ICD-10-CM | POA: Diagnosis not present

## 2014-08-10 DIAGNOSIS — E876 Hypokalemia: Secondary | ICD-10-CM | POA: Diagnosis not present

## 2014-08-10 DIAGNOSIS — D509 Iron deficiency anemia, unspecified: Secondary | ICD-10-CM | POA: Diagnosis not present

## 2014-08-10 DIAGNOSIS — R52 Pain, unspecified: Secondary | ICD-10-CM | POA: Diagnosis not present

## 2014-08-10 DIAGNOSIS — M109 Gout, unspecified: Secondary | ICD-10-CM | POA: Diagnosis not present

## 2014-08-13 DIAGNOSIS — M109 Gout, unspecified: Secondary | ICD-10-CM | POA: Diagnosis not present

## 2014-08-13 DIAGNOSIS — D509 Iron deficiency anemia, unspecified: Secondary | ICD-10-CM | POA: Diagnosis not present

## 2014-08-13 DIAGNOSIS — N186 End stage renal disease: Secondary | ICD-10-CM | POA: Diagnosis not present

## 2014-08-13 DIAGNOSIS — R52 Pain, unspecified: Secondary | ICD-10-CM | POA: Diagnosis not present

## 2014-08-13 DIAGNOSIS — D689 Coagulation defect, unspecified: Secondary | ICD-10-CM | POA: Diagnosis not present

## 2014-08-13 DIAGNOSIS — E876 Hypokalemia: Secondary | ICD-10-CM | POA: Diagnosis not present

## 2014-08-13 DIAGNOSIS — D631 Anemia in chronic kidney disease: Secondary | ICD-10-CM | POA: Diagnosis not present

## 2014-08-14 DIAGNOSIS — M109 Gout, unspecified: Secondary | ICD-10-CM | POA: Diagnosis not present

## 2014-08-15 DIAGNOSIS — M109 Gout, unspecified: Secondary | ICD-10-CM | POA: Diagnosis not present

## 2014-08-15 DIAGNOSIS — E876 Hypokalemia: Secondary | ICD-10-CM | POA: Diagnosis not present

## 2014-08-15 DIAGNOSIS — R531 Weakness: Secondary | ICD-10-CM | POA: Diagnosis not present

## 2014-08-15 DIAGNOSIS — I959 Hypotension, unspecified: Secondary | ICD-10-CM | POA: Diagnosis not present

## 2014-08-15 DIAGNOSIS — R031 Nonspecific low blood-pressure reading: Secondary | ICD-10-CM | POA: Diagnosis not present

## 2014-08-15 DIAGNOSIS — D689 Coagulation defect, unspecified: Secondary | ICD-10-CM | POA: Diagnosis not present

## 2014-08-15 DIAGNOSIS — H538 Other visual disturbances: Secondary | ICD-10-CM | POA: Diagnosis not present

## 2014-08-15 DIAGNOSIS — R52 Pain, unspecified: Secondary | ICD-10-CM | POA: Diagnosis not present

## 2014-08-15 DIAGNOSIS — D509 Iron deficiency anemia, unspecified: Secondary | ICD-10-CM | POA: Diagnosis not present

## 2014-08-15 DIAGNOSIS — I951 Orthostatic hypotension: Secondary | ICD-10-CM | POA: Diagnosis not present

## 2014-08-15 DIAGNOSIS — D631 Anemia in chronic kidney disease: Secondary | ICD-10-CM | POA: Diagnosis not present

## 2014-08-15 DIAGNOSIS — N186 End stage renal disease: Secondary | ICD-10-CM | POA: Diagnosis not present

## 2014-08-16 DIAGNOSIS — M109 Gout, unspecified: Secondary | ICD-10-CM | POA: Diagnosis not present

## 2014-08-17 DIAGNOSIS — D689 Coagulation defect, unspecified: Secondary | ICD-10-CM | POA: Diagnosis not present

## 2014-08-17 DIAGNOSIS — M109 Gout, unspecified: Secondary | ICD-10-CM | POA: Diagnosis not present

## 2014-08-17 DIAGNOSIS — D631 Anemia in chronic kidney disease: Secondary | ICD-10-CM | POA: Diagnosis not present

## 2014-08-17 DIAGNOSIS — E876 Hypokalemia: Secondary | ICD-10-CM | POA: Diagnosis not present

## 2014-08-17 DIAGNOSIS — N186 End stage renal disease: Secondary | ICD-10-CM | POA: Diagnosis not present

## 2014-08-17 DIAGNOSIS — R52 Pain, unspecified: Secondary | ICD-10-CM | POA: Diagnosis not present

## 2014-08-17 DIAGNOSIS — D509 Iron deficiency anemia, unspecified: Secondary | ICD-10-CM | POA: Diagnosis not present

## 2014-08-20 DIAGNOSIS — N186 End stage renal disease: Secondary | ICD-10-CM | POA: Diagnosis not present

## 2014-08-20 DIAGNOSIS — E876 Hypokalemia: Secondary | ICD-10-CM | POA: Diagnosis not present

## 2014-08-20 DIAGNOSIS — D631 Anemia in chronic kidney disease: Secondary | ICD-10-CM | POA: Diagnosis not present

## 2014-08-20 DIAGNOSIS — R52 Pain, unspecified: Secondary | ICD-10-CM | POA: Diagnosis not present

## 2014-08-20 DIAGNOSIS — D689 Coagulation defect, unspecified: Secondary | ICD-10-CM | POA: Diagnosis not present

## 2014-08-20 DIAGNOSIS — M109 Gout, unspecified: Secondary | ICD-10-CM | POA: Diagnosis not present

## 2014-08-20 DIAGNOSIS — D509 Iron deficiency anemia, unspecified: Secondary | ICD-10-CM | POA: Diagnosis not present

## 2014-08-21 DIAGNOSIS — M109 Gout, unspecified: Secondary | ICD-10-CM | POA: Diagnosis not present

## 2014-08-22 DIAGNOSIS — N186 End stage renal disease: Secondary | ICD-10-CM | POA: Diagnosis not present

## 2014-08-22 DIAGNOSIS — M109 Gout, unspecified: Secondary | ICD-10-CM | POA: Diagnosis not present

## 2014-08-22 DIAGNOSIS — E876 Hypokalemia: Secondary | ICD-10-CM | POA: Diagnosis not present

## 2014-08-22 DIAGNOSIS — D689 Coagulation defect, unspecified: Secondary | ICD-10-CM | POA: Diagnosis not present

## 2014-08-22 DIAGNOSIS — D631 Anemia in chronic kidney disease: Secondary | ICD-10-CM | POA: Diagnosis not present

## 2014-08-22 DIAGNOSIS — D509 Iron deficiency anemia, unspecified: Secondary | ICD-10-CM | POA: Diagnosis not present

## 2014-08-22 DIAGNOSIS — R52 Pain, unspecified: Secondary | ICD-10-CM | POA: Diagnosis not present

## 2014-08-23 DIAGNOSIS — Z6825 Body mass index (BMI) 25.0-25.9, adult: Secondary | ICD-10-CM | POA: Diagnosis not present

## 2014-08-23 DIAGNOSIS — M109 Gout, unspecified: Secondary | ICD-10-CM | POA: Diagnosis not present

## 2014-08-23 DIAGNOSIS — E876 Hypokalemia: Secondary | ICD-10-CM | POA: Diagnosis not present

## 2014-08-23 DIAGNOSIS — R55 Syncope and collapse: Secondary | ICD-10-CM | POA: Diagnosis not present

## 2014-08-23 DIAGNOSIS — R209 Unspecified disturbances of skin sensation: Secondary | ICD-10-CM | POA: Diagnosis not present

## 2014-08-23 DIAGNOSIS — I5030 Unspecified diastolic (congestive) heart failure: Secondary | ICD-10-CM | POA: Diagnosis not present

## 2014-08-23 DIAGNOSIS — I209 Angina pectoris, unspecified: Secondary | ICD-10-CM | POA: Diagnosis not present

## 2014-08-24 DIAGNOSIS — D631 Anemia in chronic kidney disease: Secondary | ICD-10-CM | POA: Diagnosis not present

## 2014-08-24 DIAGNOSIS — D509 Iron deficiency anemia, unspecified: Secondary | ICD-10-CM | POA: Diagnosis not present

## 2014-08-24 DIAGNOSIS — M109 Gout, unspecified: Secondary | ICD-10-CM | POA: Diagnosis not present

## 2014-08-24 DIAGNOSIS — R52 Pain, unspecified: Secondary | ICD-10-CM | POA: Diagnosis not present

## 2014-08-24 DIAGNOSIS — D689 Coagulation defect, unspecified: Secondary | ICD-10-CM | POA: Diagnosis not present

## 2014-08-24 DIAGNOSIS — N186 End stage renal disease: Secondary | ICD-10-CM | POA: Diagnosis not present

## 2014-08-24 DIAGNOSIS — E876 Hypokalemia: Secondary | ICD-10-CM | POA: Diagnosis not present

## 2014-08-25 DIAGNOSIS — N186 End stage renal disease: Secondary | ICD-10-CM | POA: Diagnosis not present

## 2014-08-25 DIAGNOSIS — I120 Hypertensive chronic kidney disease with stage 5 chronic kidney disease or end stage renal disease: Secondary | ICD-10-CM | POA: Diagnosis not present

## 2014-08-25 DIAGNOSIS — Z992 Dependence on renal dialysis: Secondary | ICD-10-CM | POA: Diagnosis not present

## 2014-08-27 DIAGNOSIS — L299 Pruritus, unspecified: Secondary | ICD-10-CM | POA: Diagnosis not present

## 2014-08-27 DIAGNOSIS — D689 Coagulation defect, unspecified: Secondary | ICD-10-CM | POA: Diagnosis not present

## 2014-08-27 DIAGNOSIS — D509 Iron deficiency anemia, unspecified: Secondary | ICD-10-CM | POA: Diagnosis not present

## 2014-08-27 DIAGNOSIS — D631 Anemia in chronic kidney disease: Secondary | ICD-10-CM | POA: Diagnosis not present

## 2014-08-27 DIAGNOSIS — R51 Headache: Secondary | ICD-10-CM | POA: Diagnosis not present

## 2014-08-27 DIAGNOSIS — E876 Hypokalemia: Secondary | ICD-10-CM | POA: Diagnosis not present

## 2014-08-27 DIAGNOSIS — M109 Gout, unspecified: Secondary | ICD-10-CM | POA: Diagnosis not present

## 2014-08-27 DIAGNOSIS — N186 End stage renal disease: Secondary | ICD-10-CM | POA: Diagnosis not present

## 2014-08-28 DIAGNOSIS — M109 Gout, unspecified: Secondary | ICD-10-CM | POA: Diagnosis not present

## 2014-08-29 DIAGNOSIS — D631 Anemia in chronic kidney disease: Secondary | ICD-10-CM | POA: Diagnosis not present

## 2014-08-29 DIAGNOSIS — M109 Gout, unspecified: Secondary | ICD-10-CM | POA: Diagnosis not present

## 2014-08-29 DIAGNOSIS — E876 Hypokalemia: Secondary | ICD-10-CM | POA: Diagnosis not present

## 2014-08-29 DIAGNOSIS — L299 Pruritus, unspecified: Secondary | ICD-10-CM | POA: Diagnosis not present

## 2014-08-29 DIAGNOSIS — D689 Coagulation defect, unspecified: Secondary | ICD-10-CM | POA: Diagnosis not present

## 2014-08-29 DIAGNOSIS — N186 End stage renal disease: Secondary | ICD-10-CM | POA: Diagnosis not present

## 2014-08-29 DIAGNOSIS — R51 Headache: Secondary | ICD-10-CM | POA: Diagnosis not present

## 2014-08-29 DIAGNOSIS — D509 Iron deficiency anemia, unspecified: Secondary | ICD-10-CM | POA: Diagnosis not present

## 2014-08-30 DIAGNOSIS — M109 Gout, unspecified: Secondary | ICD-10-CM | POA: Diagnosis not present

## 2014-08-31 DIAGNOSIS — D509 Iron deficiency anemia, unspecified: Secondary | ICD-10-CM | POA: Diagnosis not present

## 2014-08-31 DIAGNOSIS — D631 Anemia in chronic kidney disease: Secondary | ICD-10-CM | POA: Diagnosis not present

## 2014-08-31 DIAGNOSIS — R51 Headache: Secondary | ICD-10-CM | POA: Diagnosis not present

## 2014-08-31 DIAGNOSIS — M109 Gout, unspecified: Secondary | ICD-10-CM | POA: Diagnosis not present

## 2014-08-31 DIAGNOSIS — L299 Pruritus, unspecified: Secondary | ICD-10-CM | POA: Diagnosis not present

## 2014-08-31 DIAGNOSIS — E876 Hypokalemia: Secondary | ICD-10-CM | POA: Diagnosis not present

## 2014-08-31 DIAGNOSIS — N186 End stage renal disease: Secondary | ICD-10-CM | POA: Diagnosis not present

## 2014-08-31 DIAGNOSIS — D689 Coagulation defect, unspecified: Secondary | ICD-10-CM | POA: Diagnosis not present

## 2014-09-03 DIAGNOSIS — D509 Iron deficiency anemia, unspecified: Secondary | ICD-10-CM | POA: Diagnosis not present

## 2014-09-03 DIAGNOSIS — L299 Pruritus, unspecified: Secondary | ICD-10-CM | POA: Diagnosis not present

## 2014-09-03 DIAGNOSIS — N186 End stage renal disease: Secondary | ICD-10-CM | POA: Diagnosis not present

## 2014-09-03 DIAGNOSIS — R51 Headache: Secondary | ICD-10-CM | POA: Diagnosis not present

## 2014-09-03 DIAGNOSIS — E876 Hypokalemia: Secondary | ICD-10-CM | POA: Diagnosis not present

## 2014-09-03 DIAGNOSIS — D689 Coagulation defect, unspecified: Secondary | ICD-10-CM | POA: Diagnosis not present

## 2014-09-03 DIAGNOSIS — M109 Gout, unspecified: Secondary | ICD-10-CM | POA: Diagnosis not present

## 2014-09-03 DIAGNOSIS — D631 Anemia in chronic kidney disease: Secondary | ICD-10-CM | POA: Diagnosis not present

## 2014-09-04 DIAGNOSIS — M109 Gout, unspecified: Secondary | ICD-10-CM | POA: Diagnosis not present

## 2014-09-05 DIAGNOSIS — R51 Headache: Secondary | ICD-10-CM | POA: Diagnosis not present

## 2014-09-05 DIAGNOSIS — N186 End stage renal disease: Secondary | ICD-10-CM | POA: Diagnosis not present

## 2014-09-05 DIAGNOSIS — D631 Anemia in chronic kidney disease: Secondary | ICD-10-CM | POA: Diagnosis not present

## 2014-09-05 DIAGNOSIS — M109 Gout, unspecified: Secondary | ICD-10-CM | POA: Diagnosis not present

## 2014-09-05 DIAGNOSIS — D509 Iron deficiency anemia, unspecified: Secondary | ICD-10-CM | POA: Diagnosis not present

## 2014-09-05 DIAGNOSIS — L299 Pruritus, unspecified: Secondary | ICD-10-CM | POA: Diagnosis not present

## 2014-09-05 DIAGNOSIS — E876 Hypokalemia: Secondary | ICD-10-CM | POA: Diagnosis not present

## 2014-09-05 DIAGNOSIS — D689 Coagulation defect, unspecified: Secondary | ICD-10-CM | POA: Diagnosis not present

## 2014-09-06 DIAGNOSIS — M109 Gout, unspecified: Secondary | ICD-10-CM | POA: Diagnosis not present

## 2014-09-07 DIAGNOSIS — M109 Gout, unspecified: Secondary | ICD-10-CM | POA: Diagnosis not present

## 2014-09-07 DIAGNOSIS — E876 Hypokalemia: Secondary | ICD-10-CM | POA: Diagnosis not present

## 2014-09-07 DIAGNOSIS — N186 End stage renal disease: Secondary | ICD-10-CM | POA: Diagnosis not present

## 2014-09-07 DIAGNOSIS — R51 Headache: Secondary | ICD-10-CM | POA: Diagnosis not present

## 2014-09-07 DIAGNOSIS — D689 Coagulation defect, unspecified: Secondary | ICD-10-CM | POA: Diagnosis not present

## 2014-09-07 DIAGNOSIS — D631 Anemia in chronic kidney disease: Secondary | ICD-10-CM | POA: Diagnosis not present

## 2014-09-07 DIAGNOSIS — D509 Iron deficiency anemia, unspecified: Secondary | ICD-10-CM | POA: Diagnosis not present

## 2014-09-07 DIAGNOSIS — L299 Pruritus, unspecified: Secondary | ICD-10-CM | POA: Diagnosis not present

## 2014-09-10 DIAGNOSIS — D689 Coagulation defect, unspecified: Secondary | ICD-10-CM | POA: Diagnosis not present

## 2014-09-10 DIAGNOSIS — L299 Pruritus, unspecified: Secondary | ICD-10-CM | POA: Diagnosis not present

## 2014-09-10 DIAGNOSIS — N186 End stage renal disease: Secondary | ICD-10-CM | POA: Diagnosis not present

## 2014-09-10 DIAGNOSIS — D509 Iron deficiency anemia, unspecified: Secondary | ICD-10-CM | POA: Diagnosis not present

## 2014-09-10 DIAGNOSIS — D631 Anemia in chronic kidney disease: Secondary | ICD-10-CM | POA: Diagnosis not present

## 2014-09-10 DIAGNOSIS — R51 Headache: Secondary | ICD-10-CM | POA: Diagnosis not present

## 2014-09-10 DIAGNOSIS — E876 Hypokalemia: Secondary | ICD-10-CM | POA: Diagnosis not present

## 2014-09-10 DIAGNOSIS — M109 Gout, unspecified: Secondary | ICD-10-CM | POA: Diagnosis not present

## 2014-09-11 DIAGNOSIS — M109 Gout, unspecified: Secondary | ICD-10-CM | POA: Diagnosis not present

## 2014-09-12 DIAGNOSIS — L299 Pruritus, unspecified: Secondary | ICD-10-CM | POA: Diagnosis not present

## 2014-09-12 DIAGNOSIS — D631 Anemia in chronic kidney disease: Secondary | ICD-10-CM | POA: Diagnosis not present

## 2014-09-12 DIAGNOSIS — M109 Gout, unspecified: Secondary | ICD-10-CM | POA: Diagnosis not present

## 2014-09-12 DIAGNOSIS — E876 Hypokalemia: Secondary | ICD-10-CM | POA: Diagnosis not present

## 2014-09-12 DIAGNOSIS — R51 Headache: Secondary | ICD-10-CM | POA: Diagnosis not present

## 2014-09-12 DIAGNOSIS — D509 Iron deficiency anemia, unspecified: Secondary | ICD-10-CM | POA: Diagnosis not present

## 2014-09-12 DIAGNOSIS — N186 End stage renal disease: Secondary | ICD-10-CM | POA: Diagnosis not present

## 2014-09-12 DIAGNOSIS — D689 Coagulation defect, unspecified: Secondary | ICD-10-CM | POA: Diagnosis not present

## 2014-09-13 DIAGNOSIS — M109 Gout, unspecified: Secondary | ICD-10-CM | POA: Diagnosis not present

## 2014-09-14 DIAGNOSIS — N186 End stage renal disease: Secondary | ICD-10-CM | POA: Diagnosis not present

## 2014-09-14 DIAGNOSIS — D631 Anemia in chronic kidney disease: Secondary | ICD-10-CM | POA: Diagnosis not present

## 2014-09-14 DIAGNOSIS — E876 Hypokalemia: Secondary | ICD-10-CM | POA: Diagnosis not present

## 2014-09-14 DIAGNOSIS — D689 Coagulation defect, unspecified: Secondary | ICD-10-CM | POA: Diagnosis not present

## 2014-09-14 DIAGNOSIS — D509 Iron deficiency anemia, unspecified: Secondary | ICD-10-CM | POA: Diagnosis not present

## 2014-09-14 DIAGNOSIS — M109 Gout, unspecified: Secondary | ICD-10-CM | POA: Diagnosis not present

## 2014-09-14 DIAGNOSIS — L299 Pruritus, unspecified: Secondary | ICD-10-CM | POA: Diagnosis not present

## 2014-09-14 DIAGNOSIS — R51 Headache: Secondary | ICD-10-CM | POA: Diagnosis not present

## 2014-09-17 DIAGNOSIS — L299 Pruritus, unspecified: Secondary | ICD-10-CM | POA: Diagnosis not present

## 2014-09-17 DIAGNOSIS — E876 Hypokalemia: Secondary | ICD-10-CM | POA: Diagnosis not present

## 2014-09-17 DIAGNOSIS — D509 Iron deficiency anemia, unspecified: Secondary | ICD-10-CM | POA: Diagnosis not present

## 2014-09-17 DIAGNOSIS — N186 End stage renal disease: Secondary | ICD-10-CM | POA: Diagnosis not present

## 2014-09-17 DIAGNOSIS — R51 Headache: Secondary | ICD-10-CM | POA: Diagnosis not present

## 2014-09-17 DIAGNOSIS — D631 Anemia in chronic kidney disease: Secondary | ICD-10-CM | POA: Diagnosis not present

## 2014-09-17 DIAGNOSIS — M109 Gout, unspecified: Secondary | ICD-10-CM | POA: Diagnosis not present

## 2014-09-17 DIAGNOSIS — D689 Coagulation defect, unspecified: Secondary | ICD-10-CM | POA: Diagnosis not present

## 2014-09-18 DIAGNOSIS — M109 Gout, unspecified: Secondary | ICD-10-CM | POA: Diagnosis not present

## 2014-09-19 DIAGNOSIS — L299 Pruritus, unspecified: Secondary | ICD-10-CM | POA: Diagnosis not present

## 2014-09-19 DIAGNOSIS — E876 Hypokalemia: Secondary | ICD-10-CM | POA: Diagnosis not present

## 2014-09-19 DIAGNOSIS — R51 Headache: Secondary | ICD-10-CM | POA: Diagnosis not present

## 2014-09-19 DIAGNOSIS — M109 Gout, unspecified: Secondary | ICD-10-CM | POA: Diagnosis not present

## 2014-09-19 DIAGNOSIS — D631 Anemia in chronic kidney disease: Secondary | ICD-10-CM | POA: Diagnosis not present

## 2014-09-19 DIAGNOSIS — N186 End stage renal disease: Secondary | ICD-10-CM | POA: Diagnosis not present

## 2014-09-19 DIAGNOSIS — D689 Coagulation defect, unspecified: Secondary | ICD-10-CM | POA: Diagnosis not present

## 2014-09-19 DIAGNOSIS — D509 Iron deficiency anemia, unspecified: Secondary | ICD-10-CM | POA: Diagnosis not present

## 2014-09-20 DIAGNOSIS — M109 Gout, unspecified: Secondary | ICD-10-CM | POA: Diagnosis not present

## 2014-09-21 DIAGNOSIS — M109 Gout, unspecified: Secondary | ICD-10-CM | POA: Diagnosis not present

## 2014-09-21 DIAGNOSIS — E876 Hypokalemia: Secondary | ICD-10-CM | POA: Diagnosis not present

## 2014-09-21 DIAGNOSIS — D631 Anemia in chronic kidney disease: Secondary | ICD-10-CM | POA: Diagnosis not present

## 2014-09-21 DIAGNOSIS — L299 Pruritus, unspecified: Secondary | ICD-10-CM | POA: Diagnosis not present

## 2014-09-21 DIAGNOSIS — D509 Iron deficiency anemia, unspecified: Secondary | ICD-10-CM | POA: Diagnosis not present

## 2014-09-21 DIAGNOSIS — N186 End stage renal disease: Secondary | ICD-10-CM | POA: Diagnosis not present

## 2014-09-21 DIAGNOSIS — R51 Headache: Secondary | ICD-10-CM | POA: Diagnosis not present

## 2014-09-21 DIAGNOSIS — D689 Coagulation defect, unspecified: Secondary | ICD-10-CM | POA: Diagnosis not present

## 2014-09-24 DIAGNOSIS — M109 Gout, unspecified: Secondary | ICD-10-CM | POA: Diagnosis not present

## 2014-09-25 DIAGNOSIS — I120 Hypertensive chronic kidney disease with stage 5 chronic kidney disease or end stage renal disease: Secondary | ICD-10-CM | POA: Diagnosis not present

## 2014-09-25 DIAGNOSIS — M109 Gout, unspecified: Secondary | ICD-10-CM | POA: Diagnosis not present

## 2014-09-25 DIAGNOSIS — Z992 Dependence on renal dialysis: Secondary | ICD-10-CM | POA: Diagnosis not present

## 2014-09-25 DIAGNOSIS — N186 End stage renal disease: Secondary | ICD-10-CM | POA: Diagnosis not present

## 2014-09-26 DIAGNOSIS — N186 End stage renal disease: Secondary | ICD-10-CM | POA: Diagnosis not present

## 2014-09-26 DIAGNOSIS — E876 Hypokalemia: Secondary | ICD-10-CM | POA: Diagnosis not present

## 2014-09-26 DIAGNOSIS — R51 Headache: Secondary | ICD-10-CM | POA: Diagnosis not present

## 2014-09-26 DIAGNOSIS — M109 Gout, unspecified: Secondary | ICD-10-CM | POA: Diagnosis not present

## 2014-09-26 DIAGNOSIS — D509 Iron deficiency anemia, unspecified: Secondary | ICD-10-CM | POA: Diagnosis not present

## 2014-09-26 DIAGNOSIS — R52 Pain, unspecified: Secondary | ICD-10-CM | POA: Diagnosis not present

## 2014-09-26 DIAGNOSIS — D631 Anemia in chronic kidney disease: Secondary | ICD-10-CM | POA: Diagnosis not present

## 2014-09-26 DIAGNOSIS — D689 Coagulation defect, unspecified: Secondary | ICD-10-CM | POA: Diagnosis not present

## 2014-09-27 DIAGNOSIS — J040 Acute laryngitis: Secondary | ICD-10-CM | POA: Diagnosis not present

## 2014-09-27 DIAGNOSIS — Z6826 Body mass index (BMI) 26.0-26.9, adult: Secondary | ICD-10-CM | POA: Diagnosis not present

## 2014-09-27 DIAGNOSIS — M109 Gout, unspecified: Secondary | ICD-10-CM | POA: Diagnosis not present

## 2014-09-28 DIAGNOSIS — M109 Gout, unspecified: Secondary | ICD-10-CM | POA: Diagnosis not present

## 2014-09-28 DIAGNOSIS — D689 Coagulation defect, unspecified: Secondary | ICD-10-CM | POA: Diagnosis not present

## 2014-09-28 DIAGNOSIS — N186 End stage renal disease: Secondary | ICD-10-CM | POA: Diagnosis not present

## 2014-09-28 DIAGNOSIS — R52 Pain, unspecified: Secondary | ICD-10-CM | POA: Diagnosis not present

## 2014-09-28 DIAGNOSIS — D631 Anemia in chronic kidney disease: Secondary | ICD-10-CM | POA: Diagnosis not present

## 2014-09-28 DIAGNOSIS — R51 Headache: Secondary | ICD-10-CM | POA: Diagnosis not present

## 2014-09-28 DIAGNOSIS — E876 Hypokalemia: Secondary | ICD-10-CM | POA: Diagnosis not present

## 2014-09-28 DIAGNOSIS — D509 Iron deficiency anemia, unspecified: Secondary | ICD-10-CM | POA: Diagnosis not present

## 2014-10-01 DIAGNOSIS — R52 Pain, unspecified: Secondary | ICD-10-CM | POA: Diagnosis not present

## 2014-10-01 DIAGNOSIS — E876 Hypokalemia: Secondary | ICD-10-CM | POA: Diagnosis not present

## 2014-10-01 DIAGNOSIS — D689 Coagulation defect, unspecified: Secondary | ICD-10-CM | POA: Diagnosis not present

## 2014-10-01 DIAGNOSIS — D509 Iron deficiency anemia, unspecified: Secondary | ICD-10-CM | POA: Diagnosis not present

## 2014-10-01 DIAGNOSIS — N186 End stage renal disease: Secondary | ICD-10-CM | POA: Diagnosis not present

## 2014-10-01 DIAGNOSIS — M109 Gout, unspecified: Secondary | ICD-10-CM | POA: Diagnosis not present

## 2014-10-01 DIAGNOSIS — R51 Headache: Secondary | ICD-10-CM | POA: Diagnosis not present

## 2014-10-01 DIAGNOSIS — D631 Anemia in chronic kidney disease: Secondary | ICD-10-CM | POA: Diagnosis not present

## 2014-10-02 DIAGNOSIS — M109 Gout, unspecified: Secondary | ICD-10-CM | POA: Diagnosis not present

## 2014-10-03 DIAGNOSIS — N186 End stage renal disease: Secondary | ICD-10-CM | POA: Diagnosis not present

## 2014-10-03 DIAGNOSIS — D689 Coagulation defect, unspecified: Secondary | ICD-10-CM | POA: Diagnosis not present

## 2014-10-03 DIAGNOSIS — D509 Iron deficiency anemia, unspecified: Secondary | ICD-10-CM | POA: Diagnosis not present

## 2014-10-03 DIAGNOSIS — R52 Pain, unspecified: Secondary | ICD-10-CM | POA: Diagnosis not present

## 2014-10-03 DIAGNOSIS — E876 Hypokalemia: Secondary | ICD-10-CM | POA: Diagnosis not present

## 2014-10-03 DIAGNOSIS — D631 Anemia in chronic kidney disease: Secondary | ICD-10-CM | POA: Diagnosis not present

## 2014-10-03 DIAGNOSIS — M109 Gout, unspecified: Secondary | ICD-10-CM | POA: Diagnosis not present

## 2014-10-03 DIAGNOSIS — R51 Headache: Secondary | ICD-10-CM | POA: Diagnosis not present

## 2014-10-04 DIAGNOSIS — M109 Gout, unspecified: Secondary | ICD-10-CM | POA: Diagnosis not present

## 2014-10-05 DIAGNOSIS — M109 Gout, unspecified: Secondary | ICD-10-CM | POA: Diagnosis not present

## 2014-10-05 DIAGNOSIS — E876 Hypokalemia: Secondary | ICD-10-CM | POA: Diagnosis not present

## 2014-10-05 DIAGNOSIS — N186 End stage renal disease: Secondary | ICD-10-CM | POA: Diagnosis not present

## 2014-10-05 DIAGNOSIS — R51 Headache: Secondary | ICD-10-CM | POA: Diagnosis not present

## 2014-10-05 DIAGNOSIS — D631 Anemia in chronic kidney disease: Secondary | ICD-10-CM | POA: Diagnosis not present

## 2014-10-05 DIAGNOSIS — D689 Coagulation defect, unspecified: Secondary | ICD-10-CM | POA: Diagnosis not present

## 2014-10-05 DIAGNOSIS — R52 Pain, unspecified: Secondary | ICD-10-CM | POA: Diagnosis not present

## 2014-10-05 DIAGNOSIS — D509 Iron deficiency anemia, unspecified: Secondary | ICD-10-CM | POA: Diagnosis not present

## 2014-10-08 DIAGNOSIS — D631 Anemia in chronic kidney disease: Secondary | ICD-10-CM | POA: Diagnosis not present

## 2014-10-08 DIAGNOSIS — N186 End stage renal disease: Secondary | ICD-10-CM | POA: Diagnosis not present

## 2014-10-08 DIAGNOSIS — E876 Hypokalemia: Secondary | ICD-10-CM | POA: Diagnosis not present

## 2014-10-08 DIAGNOSIS — D509 Iron deficiency anemia, unspecified: Secondary | ICD-10-CM | POA: Diagnosis not present

## 2014-10-08 DIAGNOSIS — R51 Headache: Secondary | ICD-10-CM | POA: Diagnosis not present

## 2014-10-08 DIAGNOSIS — R52 Pain, unspecified: Secondary | ICD-10-CM | POA: Diagnosis not present

## 2014-10-08 DIAGNOSIS — M109 Gout, unspecified: Secondary | ICD-10-CM | POA: Diagnosis not present

## 2014-10-08 DIAGNOSIS — D689 Coagulation defect, unspecified: Secondary | ICD-10-CM | POA: Diagnosis not present

## 2014-10-09 DIAGNOSIS — M109 Gout, unspecified: Secondary | ICD-10-CM | POA: Diagnosis not present

## 2014-10-10 DIAGNOSIS — R51 Headache: Secondary | ICD-10-CM | POA: Diagnosis not present

## 2014-10-10 DIAGNOSIS — D689 Coagulation defect, unspecified: Secondary | ICD-10-CM | POA: Diagnosis not present

## 2014-10-10 DIAGNOSIS — R52 Pain, unspecified: Secondary | ICD-10-CM | POA: Diagnosis not present

## 2014-10-10 DIAGNOSIS — D509 Iron deficiency anemia, unspecified: Secondary | ICD-10-CM | POA: Diagnosis not present

## 2014-10-10 DIAGNOSIS — N186 End stage renal disease: Secondary | ICD-10-CM | POA: Diagnosis not present

## 2014-10-10 DIAGNOSIS — E876 Hypokalemia: Secondary | ICD-10-CM | POA: Diagnosis not present

## 2014-10-10 DIAGNOSIS — M109 Gout, unspecified: Secondary | ICD-10-CM | POA: Diagnosis not present

## 2014-10-10 DIAGNOSIS — D631 Anemia in chronic kidney disease: Secondary | ICD-10-CM | POA: Diagnosis not present

## 2014-10-11 DIAGNOSIS — M109 Gout, unspecified: Secondary | ICD-10-CM | POA: Diagnosis not present

## 2014-10-12 DIAGNOSIS — M109 Gout, unspecified: Secondary | ICD-10-CM | POA: Diagnosis not present

## 2014-10-12 DIAGNOSIS — D509 Iron deficiency anemia, unspecified: Secondary | ICD-10-CM | POA: Diagnosis not present

## 2014-10-12 DIAGNOSIS — D689 Coagulation defect, unspecified: Secondary | ICD-10-CM | POA: Diagnosis not present

## 2014-10-12 DIAGNOSIS — E876 Hypokalemia: Secondary | ICD-10-CM | POA: Diagnosis not present

## 2014-10-12 DIAGNOSIS — D631 Anemia in chronic kidney disease: Secondary | ICD-10-CM | POA: Diagnosis not present

## 2014-10-12 DIAGNOSIS — N186 End stage renal disease: Secondary | ICD-10-CM | POA: Diagnosis not present

## 2014-10-12 DIAGNOSIS — R52 Pain, unspecified: Secondary | ICD-10-CM | POA: Diagnosis not present

## 2014-10-12 DIAGNOSIS — R51 Headache: Secondary | ICD-10-CM | POA: Diagnosis not present

## 2014-10-15 DIAGNOSIS — N186 End stage renal disease: Secondary | ICD-10-CM | POA: Diagnosis not present

## 2014-10-15 DIAGNOSIS — D689 Coagulation defect, unspecified: Secondary | ICD-10-CM | POA: Diagnosis not present

## 2014-10-15 DIAGNOSIS — D631 Anemia in chronic kidney disease: Secondary | ICD-10-CM | POA: Diagnosis not present

## 2014-10-15 DIAGNOSIS — R51 Headache: Secondary | ICD-10-CM | POA: Diagnosis not present

## 2014-10-15 DIAGNOSIS — E876 Hypokalemia: Secondary | ICD-10-CM | POA: Diagnosis not present

## 2014-10-15 DIAGNOSIS — R52 Pain, unspecified: Secondary | ICD-10-CM | POA: Diagnosis not present

## 2014-10-15 DIAGNOSIS — D509 Iron deficiency anemia, unspecified: Secondary | ICD-10-CM | POA: Diagnosis not present

## 2014-10-16 DIAGNOSIS — M5136 Other intervertebral disc degeneration, lumbar region: Secondary | ICD-10-CM | POA: Diagnosis not present

## 2014-10-16 DIAGNOSIS — M25551 Pain in right hip: Secondary | ICD-10-CM | POA: Diagnosis not present

## 2014-10-16 DIAGNOSIS — Z6827 Body mass index (BMI) 27.0-27.9, adult: Secondary | ICD-10-CM | POA: Diagnosis not present

## 2014-10-17 DIAGNOSIS — N186 End stage renal disease: Secondary | ICD-10-CM | POA: Diagnosis not present

## 2014-10-17 DIAGNOSIS — D509 Iron deficiency anemia, unspecified: Secondary | ICD-10-CM | POA: Diagnosis not present

## 2014-10-17 DIAGNOSIS — D631 Anemia in chronic kidney disease: Secondary | ICD-10-CM | POA: Diagnosis not present

## 2014-10-17 DIAGNOSIS — E876 Hypokalemia: Secondary | ICD-10-CM | POA: Diagnosis not present

## 2014-10-17 DIAGNOSIS — D689 Coagulation defect, unspecified: Secondary | ICD-10-CM | POA: Diagnosis not present

## 2014-10-17 DIAGNOSIS — R52 Pain, unspecified: Secondary | ICD-10-CM | POA: Diagnosis not present

## 2014-10-17 DIAGNOSIS — R51 Headache: Secondary | ICD-10-CM | POA: Diagnosis not present

## 2014-10-19 DIAGNOSIS — D509 Iron deficiency anemia, unspecified: Secondary | ICD-10-CM | POA: Diagnosis not present

## 2014-10-19 DIAGNOSIS — D689 Coagulation defect, unspecified: Secondary | ICD-10-CM | POA: Diagnosis not present

## 2014-10-19 DIAGNOSIS — N186 End stage renal disease: Secondary | ICD-10-CM | POA: Diagnosis not present

## 2014-10-19 DIAGNOSIS — R51 Headache: Secondary | ICD-10-CM | POA: Diagnosis not present

## 2014-10-19 DIAGNOSIS — E876 Hypokalemia: Secondary | ICD-10-CM | POA: Diagnosis not present

## 2014-10-19 DIAGNOSIS — D631 Anemia in chronic kidney disease: Secondary | ICD-10-CM | POA: Diagnosis not present

## 2014-10-19 DIAGNOSIS — R52 Pain, unspecified: Secondary | ICD-10-CM | POA: Diagnosis not present

## 2014-10-22 DIAGNOSIS — D689 Coagulation defect, unspecified: Secondary | ICD-10-CM | POA: Diagnosis not present

## 2014-10-22 DIAGNOSIS — D631 Anemia in chronic kidney disease: Secondary | ICD-10-CM | POA: Diagnosis not present

## 2014-10-22 DIAGNOSIS — N186 End stage renal disease: Secondary | ICD-10-CM | POA: Diagnosis not present

## 2014-10-22 DIAGNOSIS — E876 Hypokalemia: Secondary | ICD-10-CM | POA: Diagnosis not present

## 2014-10-22 DIAGNOSIS — D509 Iron deficiency anemia, unspecified: Secondary | ICD-10-CM | POA: Diagnosis not present

## 2014-10-22 DIAGNOSIS — R52 Pain, unspecified: Secondary | ICD-10-CM | POA: Diagnosis not present

## 2014-10-22 DIAGNOSIS — R51 Headache: Secondary | ICD-10-CM | POA: Diagnosis not present

## 2014-10-24 DIAGNOSIS — D689 Coagulation defect, unspecified: Secondary | ICD-10-CM | POA: Diagnosis not present

## 2014-10-24 DIAGNOSIS — D509 Iron deficiency anemia, unspecified: Secondary | ICD-10-CM | POA: Diagnosis not present

## 2014-10-24 DIAGNOSIS — R51 Headache: Secondary | ICD-10-CM | POA: Diagnosis not present

## 2014-10-24 DIAGNOSIS — R52 Pain, unspecified: Secondary | ICD-10-CM | POA: Diagnosis not present

## 2014-10-24 DIAGNOSIS — D631 Anemia in chronic kidney disease: Secondary | ICD-10-CM | POA: Diagnosis not present

## 2014-10-24 DIAGNOSIS — E876 Hypokalemia: Secondary | ICD-10-CM | POA: Diagnosis not present

## 2014-10-24 DIAGNOSIS — N186 End stage renal disease: Secondary | ICD-10-CM | POA: Diagnosis not present

## 2014-10-25 DIAGNOSIS — I120 Hypertensive chronic kidney disease with stage 5 chronic kidney disease or end stage renal disease: Secondary | ICD-10-CM | POA: Diagnosis not present

## 2014-10-25 DIAGNOSIS — Z992 Dependence on renal dialysis: Secondary | ICD-10-CM | POA: Diagnosis not present

## 2014-10-25 DIAGNOSIS — N186 End stage renal disease: Secondary | ICD-10-CM | POA: Diagnosis not present

## 2014-10-30 DIAGNOSIS — D689 Coagulation defect, unspecified: Secondary | ICD-10-CM | POA: Diagnosis not present

## 2014-10-30 DIAGNOSIS — D631 Anemia in chronic kidney disease: Secondary | ICD-10-CM | POA: Diagnosis not present

## 2014-10-30 DIAGNOSIS — N186 End stage renal disease: Secondary | ICD-10-CM | POA: Diagnosis not present

## 2014-10-30 DIAGNOSIS — E876 Hypokalemia: Secondary | ICD-10-CM | POA: Diagnosis not present

## 2014-10-30 DIAGNOSIS — D509 Iron deficiency anemia, unspecified: Secondary | ICD-10-CM | POA: Diagnosis not present

## 2014-10-30 DIAGNOSIS — R52 Pain, unspecified: Secondary | ICD-10-CM | POA: Diagnosis not present

## 2014-11-02 DIAGNOSIS — R52 Pain, unspecified: Secondary | ICD-10-CM | POA: Diagnosis not present

## 2014-11-02 DIAGNOSIS — D631 Anemia in chronic kidney disease: Secondary | ICD-10-CM | POA: Diagnosis not present

## 2014-11-02 DIAGNOSIS — N186 End stage renal disease: Secondary | ICD-10-CM | POA: Diagnosis not present

## 2014-11-02 DIAGNOSIS — D509 Iron deficiency anemia, unspecified: Secondary | ICD-10-CM | POA: Diagnosis not present

## 2014-11-02 DIAGNOSIS — E876 Hypokalemia: Secondary | ICD-10-CM | POA: Diagnosis not present

## 2014-11-02 DIAGNOSIS — D689 Coagulation defect, unspecified: Secondary | ICD-10-CM | POA: Diagnosis not present

## 2014-11-05 DIAGNOSIS — D631 Anemia in chronic kidney disease: Secondary | ICD-10-CM | POA: Diagnosis not present

## 2014-11-05 DIAGNOSIS — R52 Pain, unspecified: Secondary | ICD-10-CM | POA: Diagnosis not present

## 2014-11-05 DIAGNOSIS — N186 End stage renal disease: Secondary | ICD-10-CM | POA: Diagnosis not present

## 2014-11-05 DIAGNOSIS — D509 Iron deficiency anemia, unspecified: Secondary | ICD-10-CM | POA: Diagnosis not present

## 2014-11-05 DIAGNOSIS — D689 Coagulation defect, unspecified: Secondary | ICD-10-CM | POA: Diagnosis not present

## 2014-11-05 DIAGNOSIS — E876 Hypokalemia: Secondary | ICD-10-CM | POA: Diagnosis not present

## 2014-11-07 DIAGNOSIS — D689 Coagulation defect, unspecified: Secondary | ICD-10-CM | POA: Diagnosis not present

## 2014-11-07 DIAGNOSIS — N186 End stage renal disease: Secondary | ICD-10-CM | POA: Diagnosis not present

## 2014-11-07 DIAGNOSIS — E876 Hypokalemia: Secondary | ICD-10-CM | POA: Diagnosis not present

## 2014-11-07 DIAGNOSIS — R52 Pain, unspecified: Secondary | ICD-10-CM | POA: Diagnosis not present

## 2014-11-07 DIAGNOSIS — D631 Anemia in chronic kidney disease: Secondary | ICD-10-CM | POA: Diagnosis not present

## 2014-11-07 DIAGNOSIS — D509 Iron deficiency anemia, unspecified: Secondary | ICD-10-CM | POA: Diagnosis not present

## 2014-11-09 DIAGNOSIS — D689 Coagulation defect, unspecified: Secondary | ICD-10-CM | POA: Diagnosis not present

## 2014-11-09 DIAGNOSIS — E876 Hypokalemia: Secondary | ICD-10-CM | POA: Diagnosis not present

## 2014-11-09 DIAGNOSIS — N186 End stage renal disease: Secondary | ICD-10-CM | POA: Diagnosis not present

## 2014-11-09 DIAGNOSIS — D631 Anemia in chronic kidney disease: Secondary | ICD-10-CM | POA: Diagnosis not present

## 2014-11-09 DIAGNOSIS — R52 Pain, unspecified: Secondary | ICD-10-CM | POA: Diagnosis not present

## 2014-11-09 DIAGNOSIS — D509 Iron deficiency anemia, unspecified: Secondary | ICD-10-CM | POA: Diagnosis not present

## 2014-11-12 DIAGNOSIS — R52 Pain, unspecified: Secondary | ICD-10-CM | POA: Diagnosis not present

## 2014-11-12 DIAGNOSIS — D631 Anemia in chronic kidney disease: Secondary | ICD-10-CM | POA: Diagnosis not present

## 2014-11-12 DIAGNOSIS — E876 Hypokalemia: Secondary | ICD-10-CM | POA: Diagnosis not present

## 2014-11-12 DIAGNOSIS — D509 Iron deficiency anemia, unspecified: Secondary | ICD-10-CM | POA: Diagnosis not present

## 2014-11-12 DIAGNOSIS — N186 End stage renal disease: Secondary | ICD-10-CM | POA: Diagnosis not present

## 2014-11-12 DIAGNOSIS — D689 Coagulation defect, unspecified: Secondary | ICD-10-CM | POA: Diagnosis not present

## 2014-11-14 DIAGNOSIS — N186 End stage renal disease: Secondary | ICD-10-CM | POA: Diagnosis not present

## 2014-11-14 DIAGNOSIS — D631 Anemia in chronic kidney disease: Secondary | ICD-10-CM | POA: Diagnosis not present

## 2014-11-14 DIAGNOSIS — E876 Hypokalemia: Secondary | ICD-10-CM | POA: Diagnosis not present

## 2014-11-14 DIAGNOSIS — D509 Iron deficiency anemia, unspecified: Secondary | ICD-10-CM | POA: Diagnosis not present

## 2014-11-14 DIAGNOSIS — R52 Pain, unspecified: Secondary | ICD-10-CM | POA: Diagnosis not present

## 2014-11-14 DIAGNOSIS — D689 Coagulation defect, unspecified: Secondary | ICD-10-CM | POA: Diagnosis not present

## 2014-11-16 DIAGNOSIS — D689 Coagulation defect, unspecified: Secondary | ICD-10-CM | POA: Diagnosis not present

## 2014-11-16 DIAGNOSIS — R52 Pain, unspecified: Secondary | ICD-10-CM | POA: Diagnosis not present

## 2014-11-16 DIAGNOSIS — N186 End stage renal disease: Secondary | ICD-10-CM | POA: Diagnosis not present

## 2014-11-16 DIAGNOSIS — D631 Anemia in chronic kidney disease: Secondary | ICD-10-CM | POA: Diagnosis not present

## 2014-11-16 DIAGNOSIS — E876 Hypokalemia: Secondary | ICD-10-CM | POA: Diagnosis not present

## 2014-11-16 DIAGNOSIS — D509 Iron deficiency anemia, unspecified: Secondary | ICD-10-CM | POA: Diagnosis not present

## 2014-11-19 DIAGNOSIS — D509 Iron deficiency anemia, unspecified: Secondary | ICD-10-CM | POA: Diagnosis not present

## 2014-11-19 DIAGNOSIS — R52 Pain, unspecified: Secondary | ICD-10-CM | POA: Diagnosis not present

## 2014-11-19 DIAGNOSIS — E876 Hypokalemia: Secondary | ICD-10-CM | POA: Diagnosis not present

## 2014-11-19 DIAGNOSIS — D689 Coagulation defect, unspecified: Secondary | ICD-10-CM | POA: Diagnosis not present

## 2014-11-19 DIAGNOSIS — N186 End stage renal disease: Secondary | ICD-10-CM | POA: Diagnosis not present

## 2014-11-19 DIAGNOSIS — D631 Anemia in chronic kidney disease: Secondary | ICD-10-CM | POA: Diagnosis not present

## 2014-11-21 DIAGNOSIS — R52 Pain, unspecified: Secondary | ICD-10-CM | POA: Diagnosis not present

## 2014-11-21 DIAGNOSIS — D509 Iron deficiency anemia, unspecified: Secondary | ICD-10-CM | POA: Diagnosis not present

## 2014-11-21 DIAGNOSIS — D631 Anemia in chronic kidney disease: Secondary | ICD-10-CM | POA: Diagnosis not present

## 2014-11-21 DIAGNOSIS — D689 Coagulation defect, unspecified: Secondary | ICD-10-CM | POA: Diagnosis not present

## 2014-11-21 DIAGNOSIS — E876 Hypokalemia: Secondary | ICD-10-CM | POA: Diagnosis not present

## 2014-11-21 DIAGNOSIS — N186 End stage renal disease: Secondary | ICD-10-CM | POA: Diagnosis not present

## 2014-11-23 DIAGNOSIS — E876 Hypokalemia: Secondary | ICD-10-CM | POA: Diagnosis not present

## 2014-11-23 DIAGNOSIS — R52 Pain, unspecified: Secondary | ICD-10-CM | POA: Diagnosis not present

## 2014-11-23 DIAGNOSIS — D631 Anemia in chronic kidney disease: Secondary | ICD-10-CM | POA: Diagnosis not present

## 2014-11-23 DIAGNOSIS — D509 Iron deficiency anemia, unspecified: Secondary | ICD-10-CM | POA: Diagnosis not present

## 2014-11-23 DIAGNOSIS — D689 Coagulation defect, unspecified: Secondary | ICD-10-CM | POA: Diagnosis not present

## 2014-11-23 DIAGNOSIS — N186 End stage renal disease: Secondary | ICD-10-CM | POA: Diagnosis not present

## 2014-11-25 DIAGNOSIS — I120 Hypertensive chronic kidney disease with stage 5 chronic kidney disease or end stage renal disease: Secondary | ICD-10-CM | POA: Diagnosis not present

## 2014-11-25 DIAGNOSIS — N186 End stage renal disease: Secondary | ICD-10-CM | POA: Diagnosis not present

## 2014-11-25 DIAGNOSIS — Z992 Dependence on renal dialysis: Secondary | ICD-10-CM | POA: Diagnosis not present

## 2014-11-28 DIAGNOSIS — D631 Anemia in chronic kidney disease: Secondary | ICD-10-CM | POA: Diagnosis not present

## 2014-11-28 DIAGNOSIS — E876 Hypokalemia: Secondary | ICD-10-CM | POA: Diagnosis not present

## 2014-11-28 DIAGNOSIS — D689 Coagulation defect, unspecified: Secondary | ICD-10-CM | POA: Diagnosis not present

## 2014-11-28 DIAGNOSIS — N186 End stage renal disease: Secondary | ICD-10-CM | POA: Diagnosis not present

## 2014-11-30 DIAGNOSIS — E876 Hypokalemia: Secondary | ICD-10-CM | POA: Diagnosis not present

## 2014-11-30 DIAGNOSIS — D631 Anemia in chronic kidney disease: Secondary | ICD-10-CM | POA: Diagnosis not present

## 2014-11-30 DIAGNOSIS — D689 Coagulation defect, unspecified: Secondary | ICD-10-CM | POA: Diagnosis not present

## 2014-11-30 DIAGNOSIS — N186 End stage renal disease: Secondary | ICD-10-CM | POA: Diagnosis not present

## 2014-12-03 DIAGNOSIS — E876 Hypokalemia: Secondary | ICD-10-CM | POA: Diagnosis not present

## 2014-12-03 DIAGNOSIS — D689 Coagulation defect, unspecified: Secondary | ICD-10-CM | POA: Diagnosis not present

## 2014-12-03 DIAGNOSIS — D631 Anemia in chronic kidney disease: Secondary | ICD-10-CM | POA: Diagnosis not present

## 2014-12-03 DIAGNOSIS — N186 End stage renal disease: Secondary | ICD-10-CM | POA: Diagnosis not present

## 2014-12-05 DIAGNOSIS — D689 Coagulation defect, unspecified: Secondary | ICD-10-CM | POA: Diagnosis not present

## 2014-12-05 DIAGNOSIS — D631 Anemia in chronic kidney disease: Secondary | ICD-10-CM | POA: Diagnosis not present

## 2014-12-05 DIAGNOSIS — E876 Hypokalemia: Secondary | ICD-10-CM | POA: Diagnosis not present

## 2014-12-05 DIAGNOSIS — N186 End stage renal disease: Secondary | ICD-10-CM | POA: Diagnosis not present

## 2014-12-07 DIAGNOSIS — N186 End stage renal disease: Secondary | ICD-10-CM | POA: Diagnosis not present

## 2014-12-07 DIAGNOSIS — E876 Hypokalemia: Secondary | ICD-10-CM | POA: Diagnosis not present

## 2014-12-07 DIAGNOSIS — D631 Anemia in chronic kidney disease: Secondary | ICD-10-CM | POA: Diagnosis not present

## 2014-12-07 DIAGNOSIS — D689 Coagulation defect, unspecified: Secondary | ICD-10-CM | POA: Diagnosis not present

## 2014-12-11 DIAGNOSIS — M810 Age-related osteoporosis without current pathological fracture: Secondary | ICD-10-CM | POA: Diagnosis not present

## 2014-12-11 DIAGNOSIS — M5136 Other intervertebral disc degeneration, lumbar region: Secondary | ICD-10-CM | POA: Diagnosis not present

## 2014-12-11 DIAGNOSIS — Z6827 Body mass index (BMI) 27.0-27.9, adult: Secondary | ICD-10-CM | POA: Diagnosis not present

## 2014-12-12 DIAGNOSIS — D631 Anemia in chronic kidney disease: Secondary | ICD-10-CM | POA: Diagnosis not present

## 2014-12-12 DIAGNOSIS — N186 End stage renal disease: Secondary | ICD-10-CM | POA: Diagnosis not present

## 2014-12-12 DIAGNOSIS — E876 Hypokalemia: Secondary | ICD-10-CM | POA: Diagnosis not present

## 2014-12-12 DIAGNOSIS — D689 Coagulation defect, unspecified: Secondary | ICD-10-CM | POA: Diagnosis not present

## 2014-12-14 DIAGNOSIS — E876 Hypokalemia: Secondary | ICD-10-CM | POA: Diagnosis not present

## 2014-12-14 DIAGNOSIS — D631 Anemia in chronic kidney disease: Secondary | ICD-10-CM | POA: Diagnosis not present

## 2014-12-14 DIAGNOSIS — N186 End stage renal disease: Secondary | ICD-10-CM | POA: Diagnosis not present

## 2014-12-14 DIAGNOSIS — D689 Coagulation defect, unspecified: Secondary | ICD-10-CM | POA: Diagnosis not present

## 2014-12-19 DIAGNOSIS — M545 Low back pain: Secondary | ICD-10-CM | POA: Diagnosis not present

## 2014-12-19 DIAGNOSIS — R609 Edema, unspecified: Secondary | ICD-10-CM | POA: Diagnosis not present

## 2014-12-19 DIAGNOSIS — E876 Hypokalemia: Secondary | ICD-10-CM | POA: Diagnosis not present

## 2014-12-19 DIAGNOSIS — N186 End stage renal disease: Secondary | ICD-10-CM | POA: Diagnosis not present

## 2014-12-19 DIAGNOSIS — R600 Localized edema: Secondary | ICD-10-CM | POA: Diagnosis not present

## 2014-12-19 DIAGNOSIS — R0902 Hypoxemia: Secondary | ICD-10-CM | POA: Diagnosis not present

## 2014-12-19 DIAGNOSIS — R0602 Shortness of breath: Secondary | ICD-10-CM | POA: Diagnosis not present

## 2014-12-19 DIAGNOSIS — D631 Anemia in chronic kidney disease: Secondary | ICD-10-CM | POA: Diagnosis not present

## 2014-12-19 DIAGNOSIS — D689 Coagulation defect, unspecified: Secondary | ICD-10-CM | POA: Diagnosis not present

## 2014-12-19 DIAGNOSIS — I517 Cardiomegaly: Secondary | ICD-10-CM | POA: Diagnosis not present

## 2014-12-19 DIAGNOSIS — J984 Other disorders of lung: Secondary | ICD-10-CM | POA: Diagnosis not present

## 2014-12-21 DIAGNOSIS — D631 Anemia in chronic kidney disease: Secondary | ICD-10-CM | POA: Diagnosis not present

## 2014-12-21 DIAGNOSIS — N186 End stage renal disease: Secondary | ICD-10-CM | POA: Diagnosis not present

## 2014-12-21 DIAGNOSIS — D689 Coagulation defect, unspecified: Secondary | ICD-10-CM | POA: Diagnosis not present

## 2014-12-21 DIAGNOSIS — E876 Hypokalemia: Secondary | ICD-10-CM | POA: Diagnosis not present

## 2014-12-24 DIAGNOSIS — N186 End stage renal disease: Secondary | ICD-10-CM | POA: Diagnosis not present

## 2014-12-24 DIAGNOSIS — D631 Anemia in chronic kidney disease: Secondary | ICD-10-CM | POA: Diagnosis not present

## 2014-12-24 DIAGNOSIS — E876 Hypokalemia: Secondary | ICD-10-CM | POA: Diagnosis not present

## 2014-12-24 DIAGNOSIS — D689 Coagulation defect, unspecified: Secondary | ICD-10-CM | POA: Diagnosis not present

## 2014-12-26 DIAGNOSIS — D689 Coagulation defect, unspecified: Secondary | ICD-10-CM | POA: Diagnosis not present

## 2014-12-26 DIAGNOSIS — N186 End stage renal disease: Secondary | ICD-10-CM | POA: Diagnosis not present

## 2014-12-26 DIAGNOSIS — Z992 Dependence on renal dialysis: Secondary | ICD-10-CM | POA: Diagnosis not present

## 2014-12-26 DIAGNOSIS — D631 Anemia in chronic kidney disease: Secondary | ICD-10-CM | POA: Diagnosis not present

## 2014-12-26 DIAGNOSIS — I120 Hypertensive chronic kidney disease with stage 5 chronic kidney disease or end stage renal disease: Secondary | ICD-10-CM | POA: Diagnosis not present

## 2014-12-26 DIAGNOSIS — E876 Hypokalemia: Secondary | ICD-10-CM | POA: Diagnosis not present

## 2014-12-28 DIAGNOSIS — E876 Hypokalemia: Secondary | ICD-10-CM | POA: Diagnosis not present

## 2014-12-28 DIAGNOSIS — D689 Coagulation defect, unspecified: Secondary | ICD-10-CM | POA: Diagnosis not present

## 2014-12-28 DIAGNOSIS — N186 End stage renal disease: Secondary | ICD-10-CM | POA: Diagnosis not present

## 2014-12-28 DIAGNOSIS — D631 Anemia in chronic kidney disease: Secondary | ICD-10-CM | POA: Diagnosis not present

## 2014-12-28 DIAGNOSIS — R52 Pain, unspecified: Secondary | ICD-10-CM | POA: Diagnosis not present

## 2014-12-31 DIAGNOSIS — N186 End stage renal disease: Secondary | ICD-10-CM | POA: Diagnosis not present

## 2014-12-31 DIAGNOSIS — D689 Coagulation defect, unspecified: Secondary | ICD-10-CM | POA: Diagnosis not present

## 2014-12-31 DIAGNOSIS — D631 Anemia in chronic kidney disease: Secondary | ICD-10-CM | POA: Diagnosis not present

## 2014-12-31 DIAGNOSIS — E876 Hypokalemia: Secondary | ICD-10-CM | POA: Diagnosis not present

## 2014-12-31 DIAGNOSIS — R52 Pain, unspecified: Secondary | ICD-10-CM | POA: Diagnosis not present

## 2015-01-02 DIAGNOSIS — N186 End stage renal disease: Secondary | ICD-10-CM | POA: Diagnosis not present

## 2015-01-02 DIAGNOSIS — R404 Transient alteration of awareness: Secondary | ICD-10-CM | POA: Diagnosis not present

## 2015-01-02 DIAGNOSIS — R079 Chest pain, unspecified: Secondary | ICD-10-CM | POA: Diagnosis not present

## 2015-01-02 DIAGNOSIS — I953 Hypotension of hemodialysis: Secondary | ICD-10-CM | POA: Diagnosis not present

## 2015-01-02 DIAGNOSIS — R52 Pain, unspecified: Secondary | ICD-10-CM | POA: Diagnosis not present

## 2015-01-02 DIAGNOSIS — I959 Hypotension, unspecified: Secondary | ICD-10-CM | POA: Diagnosis not present

## 2015-01-02 DIAGNOSIS — D631 Anemia in chronic kidney disease: Secondary | ICD-10-CM | POA: Diagnosis not present

## 2015-01-02 DIAGNOSIS — R55 Syncope and collapse: Secondary | ICD-10-CM | POA: Diagnosis not present

## 2015-01-02 DIAGNOSIS — E876 Hypokalemia: Secondary | ICD-10-CM | POA: Diagnosis not present

## 2015-01-02 DIAGNOSIS — D689 Coagulation defect, unspecified: Secondary | ICD-10-CM | POA: Diagnosis not present

## 2015-01-04 DIAGNOSIS — N186 End stage renal disease: Secondary | ICD-10-CM | POA: Diagnosis not present

## 2015-01-04 DIAGNOSIS — D689 Coagulation defect, unspecified: Secondary | ICD-10-CM | POA: Diagnosis not present

## 2015-01-04 DIAGNOSIS — R52 Pain, unspecified: Secondary | ICD-10-CM | POA: Diagnosis not present

## 2015-01-04 DIAGNOSIS — D631 Anemia in chronic kidney disease: Secondary | ICD-10-CM | POA: Diagnosis not present

## 2015-01-04 DIAGNOSIS — E876 Hypokalemia: Secondary | ICD-10-CM | POA: Diagnosis not present

## 2015-01-07 DIAGNOSIS — D689 Coagulation defect, unspecified: Secondary | ICD-10-CM | POA: Diagnosis not present

## 2015-01-07 DIAGNOSIS — D631 Anemia in chronic kidney disease: Secondary | ICD-10-CM | POA: Diagnosis not present

## 2015-01-07 DIAGNOSIS — E876 Hypokalemia: Secondary | ICD-10-CM | POA: Diagnosis not present

## 2015-01-07 DIAGNOSIS — N186 End stage renal disease: Secondary | ICD-10-CM | POA: Diagnosis not present

## 2015-01-07 DIAGNOSIS — R52 Pain, unspecified: Secondary | ICD-10-CM | POA: Diagnosis not present

## 2015-01-08 DIAGNOSIS — R079 Chest pain, unspecified: Secondary | ICD-10-CM | POA: Diagnosis not present

## 2015-01-08 DIAGNOSIS — N186 End stage renal disease: Secondary | ICD-10-CM | POA: Diagnosis not present

## 2015-01-08 DIAGNOSIS — R55 Syncope and collapse: Secondary | ICD-10-CM | POA: Diagnosis not present

## 2015-01-08 DIAGNOSIS — Z992 Dependence on renal dialysis: Secondary | ICD-10-CM | POA: Diagnosis not present

## 2015-01-09 DIAGNOSIS — R52 Pain, unspecified: Secondary | ICD-10-CM | POA: Diagnosis not present

## 2015-01-09 DIAGNOSIS — E876 Hypokalemia: Secondary | ICD-10-CM | POA: Diagnosis not present

## 2015-01-09 DIAGNOSIS — D689 Coagulation defect, unspecified: Secondary | ICD-10-CM | POA: Diagnosis not present

## 2015-01-09 DIAGNOSIS — N186 End stage renal disease: Secondary | ICD-10-CM | POA: Diagnosis not present

## 2015-01-09 DIAGNOSIS — D631 Anemia in chronic kidney disease: Secondary | ICD-10-CM | POA: Diagnosis not present

## 2015-01-11 DIAGNOSIS — D631 Anemia in chronic kidney disease: Secondary | ICD-10-CM | POA: Diagnosis not present

## 2015-01-11 DIAGNOSIS — R52 Pain, unspecified: Secondary | ICD-10-CM | POA: Diagnosis not present

## 2015-01-11 DIAGNOSIS — E876 Hypokalemia: Secondary | ICD-10-CM | POA: Diagnosis not present

## 2015-01-11 DIAGNOSIS — D689 Coagulation defect, unspecified: Secondary | ICD-10-CM | POA: Diagnosis not present

## 2015-01-11 DIAGNOSIS — N186 End stage renal disease: Secondary | ICD-10-CM | POA: Diagnosis not present

## 2015-01-16 DIAGNOSIS — E876 Hypokalemia: Secondary | ICD-10-CM | POA: Diagnosis not present

## 2015-01-16 DIAGNOSIS — D631 Anemia in chronic kidney disease: Secondary | ICD-10-CM | POA: Diagnosis not present

## 2015-01-16 DIAGNOSIS — N186 End stage renal disease: Secondary | ICD-10-CM | POA: Diagnosis not present

## 2015-01-16 DIAGNOSIS — D689 Coagulation defect, unspecified: Secondary | ICD-10-CM | POA: Diagnosis not present

## 2015-01-16 DIAGNOSIS — R52 Pain, unspecified: Secondary | ICD-10-CM | POA: Diagnosis not present

## 2015-01-18 DIAGNOSIS — D631 Anemia in chronic kidney disease: Secondary | ICD-10-CM | POA: Diagnosis not present

## 2015-01-18 DIAGNOSIS — E876 Hypokalemia: Secondary | ICD-10-CM | POA: Diagnosis not present

## 2015-01-18 DIAGNOSIS — D689 Coagulation defect, unspecified: Secondary | ICD-10-CM | POA: Diagnosis not present

## 2015-01-18 DIAGNOSIS — N186 End stage renal disease: Secondary | ICD-10-CM | POA: Diagnosis not present

## 2015-01-18 DIAGNOSIS — R52 Pain, unspecified: Secondary | ICD-10-CM | POA: Diagnosis not present

## 2015-01-21 DIAGNOSIS — N186 End stage renal disease: Secondary | ICD-10-CM | POA: Diagnosis not present

## 2015-01-21 DIAGNOSIS — D631 Anemia in chronic kidney disease: Secondary | ICD-10-CM | POA: Diagnosis not present

## 2015-01-21 DIAGNOSIS — E876 Hypokalemia: Secondary | ICD-10-CM | POA: Diagnosis not present

## 2015-01-21 DIAGNOSIS — R52 Pain, unspecified: Secondary | ICD-10-CM | POA: Diagnosis not present

## 2015-01-21 DIAGNOSIS — D689 Coagulation defect, unspecified: Secondary | ICD-10-CM | POA: Diagnosis not present

## 2015-01-23 DIAGNOSIS — D689 Coagulation defect, unspecified: Secondary | ICD-10-CM | POA: Diagnosis not present

## 2015-01-23 DIAGNOSIS — R52 Pain, unspecified: Secondary | ICD-10-CM | POA: Diagnosis not present

## 2015-01-23 DIAGNOSIS — N186 End stage renal disease: Secondary | ICD-10-CM | POA: Diagnosis not present

## 2015-01-23 DIAGNOSIS — D631 Anemia in chronic kidney disease: Secondary | ICD-10-CM | POA: Diagnosis not present

## 2015-01-23 DIAGNOSIS — E876 Hypokalemia: Secondary | ICD-10-CM | POA: Diagnosis not present

## 2015-01-25 DIAGNOSIS — D689 Coagulation defect, unspecified: Secondary | ICD-10-CM | POA: Diagnosis not present

## 2015-01-25 DIAGNOSIS — Z992 Dependence on renal dialysis: Secondary | ICD-10-CM | POA: Diagnosis not present

## 2015-01-25 DIAGNOSIS — E876 Hypokalemia: Secondary | ICD-10-CM | POA: Diagnosis not present

## 2015-01-25 DIAGNOSIS — I120 Hypertensive chronic kidney disease with stage 5 chronic kidney disease or end stage renal disease: Secondary | ICD-10-CM | POA: Diagnosis not present

## 2015-01-25 DIAGNOSIS — N186 End stage renal disease: Secondary | ICD-10-CM | POA: Diagnosis not present

## 2015-01-25 DIAGNOSIS — D631 Anemia in chronic kidney disease: Secondary | ICD-10-CM | POA: Diagnosis not present

## 2015-01-25 DIAGNOSIS — R52 Pain, unspecified: Secondary | ICD-10-CM | POA: Diagnosis not present

## 2015-01-30 DIAGNOSIS — D689 Coagulation defect, unspecified: Secondary | ICD-10-CM | POA: Diagnosis not present

## 2015-01-30 DIAGNOSIS — N186 End stage renal disease: Secondary | ICD-10-CM | POA: Diagnosis not present

## 2015-01-30 DIAGNOSIS — E876 Hypokalemia: Secondary | ICD-10-CM | POA: Diagnosis not present

## 2015-02-01 DIAGNOSIS — E876 Hypokalemia: Secondary | ICD-10-CM | POA: Diagnosis not present

## 2015-02-01 DIAGNOSIS — N186 End stage renal disease: Secondary | ICD-10-CM | POA: Diagnosis not present

## 2015-02-01 DIAGNOSIS — D689 Coagulation defect, unspecified: Secondary | ICD-10-CM | POA: Diagnosis not present

## 2015-02-04 DIAGNOSIS — N186 End stage renal disease: Secondary | ICD-10-CM | POA: Diagnosis not present

## 2015-02-04 DIAGNOSIS — D689 Coagulation defect, unspecified: Secondary | ICD-10-CM | POA: Diagnosis not present

## 2015-02-04 DIAGNOSIS — E876 Hypokalemia: Secondary | ICD-10-CM | POA: Diagnosis not present

## 2015-02-06 DIAGNOSIS — D689 Coagulation defect, unspecified: Secondary | ICD-10-CM | POA: Diagnosis not present

## 2015-02-06 DIAGNOSIS — N186 End stage renal disease: Secondary | ICD-10-CM | POA: Diagnosis not present

## 2015-02-06 DIAGNOSIS — E876 Hypokalemia: Secondary | ICD-10-CM | POA: Diagnosis not present

## 2015-02-07 DIAGNOSIS — I10 Essential (primary) hypertension: Secondary | ICD-10-CM | POA: Diagnosis not present

## 2015-02-07 DIAGNOSIS — E785 Hyperlipidemia, unspecified: Secondary | ICD-10-CM | POA: Diagnosis not present

## 2015-02-07 DIAGNOSIS — E1142 Type 2 diabetes mellitus with diabetic polyneuropathy: Secondary | ICD-10-CM | POA: Diagnosis not present

## 2015-02-07 DIAGNOSIS — I5030 Unspecified diastolic (congestive) heart failure: Secondary | ICD-10-CM | POA: Diagnosis not present

## 2015-02-07 DIAGNOSIS — N186 End stage renal disease: Secondary | ICD-10-CM | POA: Diagnosis not present

## 2015-02-07 DIAGNOSIS — E1129 Type 2 diabetes mellitus with other diabetic kidney complication: Secondary | ICD-10-CM | POA: Diagnosis not present

## 2015-02-11 DIAGNOSIS — N186 End stage renal disease: Secondary | ICD-10-CM | POA: Diagnosis not present

## 2015-02-11 DIAGNOSIS — E876 Hypokalemia: Secondary | ICD-10-CM | POA: Diagnosis not present

## 2015-02-11 DIAGNOSIS — D689 Coagulation defect, unspecified: Secondary | ICD-10-CM | POA: Diagnosis not present

## 2015-02-13 DIAGNOSIS — E876 Hypokalemia: Secondary | ICD-10-CM | POA: Diagnosis not present

## 2015-02-13 DIAGNOSIS — N186 End stage renal disease: Secondary | ICD-10-CM | POA: Diagnosis not present

## 2015-02-13 DIAGNOSIS — D689 Coagulation defect, unspecified: Secondary | ICD-10-CM | POA: Diagnosis not present

## 2015-02-15 DIAGNOSIS — D689 Coagulation defect, unspecified: Secondary | ICD-10-CM | POA: Diagnosis not present

## 2015-02-15 DIAGNOSIS — N186 End stage renal disease: Secondary | ICD-10-CM | POA: Diagnosis not present

## 2015-02-15 DIAGNOSIS — E876 Hypokalemia: Secondary | ICD-10-CM | POA: Diagnosis not present

## 2015-02-18 DIAGNOSIS — D689 Coagulation defect, unspecified: Secondary | ICD-10-CM | POA: Diagnosis not present

## 2015-02-18 DIAGNOSIS — N186 End stage renal disease: Secondary | ICD-10-CM | POA: Diagnosis not present

## 2015-02-18 DIAGNOSIS — E876 Hypokalemia: Secondary | ICD-10-CM | POA: Diagnosis not present

## 2015-02-20 DIAGNOSIS — N186 End stage renal disease: Secondary | ICD-10-CM | POA: Diagnosis not present

## 2015-02-20 DIAGNOSIS — D689 Coagulation defect, unspecified: Secondary | ICD-10-CM | POA: Diagnosis not present

## 2015-02-20 DIAGNOSIS — E876 Hypokalemia: Secondary | ICD-10-CM | POA: Diagnosis not present

## 2015-02-22 DIAGNOSIS — N186 End stage renal disease: Secondary | ICD-10-CM | POA: Diagnosis not present

## 2015-02-22 DIAGNOSIS — E876 Hypokalemia: Secondary | ICD-10-CM | POA: Diagnosis not present

## 2015-02-22 DIAGNOSIS — D689 Coagulation defect, unspecified: Secondary | ICD-10-CM | POA: Diagnosis not present

## 2015-02-25 DIAGNOSIS — E876 Hypokalemia: Secondary | ICD-10-CM | POA: Diagnosis not present

## 2015-02-25 DIAGNOSIS — Z992 Dependence on renal dialysis: Secondary | ICD-10-CM | POA: Diagnosis not present

## 2015-02-25 DIAGNOSIS — N186 End stage renal disease: Secondary | ICD-10-CM | POA: Diagnosis not present

## 2015-02-25 DIAGNOSIS — D689 Coagulation defect, unspecified: Secondary | ICD-10-CM | POA: Diagnosis not present

## 2015-02-25 DIAGNOSIS — I120 Hypertensive chronic kidney disease with stage 5 chronic kidney disease or end stage renal disease: Secondary | ICD-10-CM | POA: Diagnosis not present

## 2015-03-01 DIAGNOSIS — E876 Hypokalemia: Secondary | ICD-10-CM | POA: Diagnosis not present

## 2015-03-01 DIAGNOSIS — D631 Anemia in chronic kidney disease: Secondary | ICD-10-CM | POA: Diagnosis not present

## 2015-03-01 DIAGNOSIS — D689 Coagulation defect, unspecified: Secondary | ICD-10-CM | POA: Diagnosis not present

## 2015-03-01 DIAGNOSIS — N186 End stage renal disease: Secondary | ICD-10-CM | POA: Diagnosis not present

## 2015-03-04 DIAGNOSIS — N186 End stage renal disease: Secondary | ICD-10-CM | POA: Diagnosis not present

## 2015-03-04 DIAGNOSIS — D689 Coagulation defect, unspecified: Secondary | ICD-10-CM | POA: Diagnosis not present

## 2015-03-04 DIAGNOSIS — D631 Anemia in chronic kidney disease: Secondary | ICD-10-CM | POA: Diagnosis not present

## 2015-03-04 DIAGNOSIS — E876 Hypokalemia: Secondary | ICD-10-CM | POA: Diagnosis not present

## 2015-03-06 DIAGNOSIS — N186 End stage renal disease: Secondary | ICD-10-CM | POA: Diagnosis not present

## 2015-03-06 DIAGNOSIS — E876 Hypokalemia: Secondary | ICD-10-CM | POA: Diagnosis not present

## 2015-03-06 DIAGNOSIS — D631 Anemia in chronic kidney disease: Secondary | ICD-10-CM | POA: Diagnosis not present

## 2015-03-06 DIAGNOSIS — D689 Coagulation defect, unspecified: Secondary | ICD-10-CM | POA: Diagnosis not present

## 2015-03-08 DIAGNOSIS — D631 Anemia in chronic kidney disease: Secondary | ICD-10-CM | POA: Diagnosis not present

## 2015-03-08 DIAGNOSIS — N186 End stage renal disease: Secondary | ICD-10-CM | POA: Diagnosis not present

## 2015-03-08 DIAGNOSIS — D689 Coagulation defect, unspecified: Secondary | ICD-10-CM | POA: Diagnosis not present

## 2015-03-08 DIAGNOSIS — E876 Hypokalemia: Secondary | ICD-10-CM | POA: Diagnosis not present

## 2015-03-13 DIAGNOSIS — D631 Anemia in chronic kidney disease: Secondary | ICD-10-CM | POA: Diagnosis not present

## 2015-03-13 DIAGNOSIS — E876 Hypokalemia: Secondary | ICD-10-CM | POA: Diagnosis not present

## 2015-03-13 DIAGNOSIS — N186 End stage renal disease: Secondary | ICD-10-CM | POA: Diagnosis not present

## 2015-03-13 DIAGNOSIS — D689 Coagulation defect, unspecified: Secondary | ICD-10-CM | POA: Diagnosis not present

## 2015-03-15 DIAGNOSIS — D631 Anemia in chronic kidney disease: Secondary | ICD-10-CM | POA: Diagnosis not present

## 2015-03-15 DIAGNOSIS — E876 Hypokalemia: Secondary | ICD-10-CM | POA: Diagnosis not present

## 2015-03-15 DIAGNOSIS — N186 End stage renal disease: Secondary | ICD-10-CM | POA: Diagnosis not present

## 2015-03-15 DIAGNOSIS — D689 Coagulation defect, unspecified: Secondary | ICD-10-CM | POA: Diagnosis not present

## 2015-03-18 DIAGNOSIS — D689 Coagulation defect, unspecified: Secondary | ICD-10-CM | POA: Diagnosis not present

## 2015-03-18 DIAGNOSIS — D631 Anemia in chronic kidney disease: Secondary | ICD-10-CM | POA: Diagnosis not present

## 2015-03-18 DIAGNOSIS — E876 Hypokalemia: Secondary | ICD-10-CM | POA: Diagnosis not present

## 2015-03-18 DIAGNOSIS — N186 End stage renal disease: Secondary | ICD-10-CM | POA: Diagnosis not present

## 2015-03-22 DIAGNOSIS — E876 Hypokalemia: Secondary | ICD-10-CM | POA: Diagnosis not present

## 2015-03-22 DIAGNOSIS — N186 End stage renal disease: Secondary | ICD-10-CM | POA: Diagnosis not present

## 2015-03-22 DIAGNOSIS — D689 Coagulation defect, unspecified: Secondary | ICD-10-CM | POA: Diagnosis not present

## 2015-03-22 DIAGNOSIS — D631 Anemia in chronic kidney disease: Secondary | ICD-10-CM | POA: Diagnosis not present

## 2015-03-25 DIAGNOSIS — E876 Hypokalemia: Secondary | ICD-10-CM | POA: Diagnosis not present

## 2015-03-25 DIAGNOSIS — N186 End stage renal disease: Secondary | ICD-10-CM | POA: Diagnosis not present

## 2015-03-25 DIAGNOSIS — D689 Coagulation defect, unspecified: Secondary | ICD-10-CM | POA: Diagnosis not present

## 2015-03-25 DIAGNOSIS — D631 Anemia in chronic kidney disease: Secondary | ICD-10-CM | POA: Diagnosis not present

## 2015-03-27 DIAGNOSIS — E876 Hypokalemia: Secondary | ICD-10-CM | POA: Diagnosis not present

## 2015-03-27 DIAGNOSIS — Z992 Dependence on renal dialysis: Secondary | ICD-10-CM | POA: Diagnosis not present

## 2015-03-27 DIAGNOSIS — I120 Hypertensive chronic kidney disease with stage 5 chronic kidney disease or end stage renal disease: Secondary | ICD-10-CM | POA: Diagnosis not present

## 2015-03-27 DIAGNOSIS — D631 Anemia in chronic kidney disease: Secondary | ICD-10-CM | POA: Diagnosis not present

## 2015-03-27 DIAGNOSIS — N186 End stage renal disease: Secondary | ICD-10-CM | POA: Diagnosis not present

## 2015-03-27 DIAGNOSIS — D689 Coagulation defect, unspecified: Secondary | ICD-10-CM | POA: Diagnosis not present

## 2015-03-29 DIAGNOSIS — D689 Coagulation defect, unspecified: Secondary | ICD-10-CM | POA: Diagnosis not present

## 2015-03-29 DIAGNOSIS — N186 End stage renal disease: Secondary | ICD-10-CM | POA: Diagnosis not present

## 2015-03-29 DIAGNOSIS — E876 Hypokalemia: Secondary | ICD-10-CM | POA: Diagnosis not present

## 2015-03-29 DIAGNOSIS — D631 Anemia in chronic kidney disease: Secondary | ICD-10-CM | POA: Diagnosis not present

## 2015-04-01 DIAGNOSIS — D631 Anemia in chronic kidney disease: Secondary | ICD-10-CM | POA: Diagnosis not present

## 2015-04-01 DIAGNOSIS — D689 Coagulation defect, unspecified: Secondary | ICD-10-CM | POA: Diagnosis not present

## 2015-04-01 DIAGNOSIS — N186 End stage renal disease: Secondary | ICD-10-CM | POA: Diagnosis not present

## 2015-04-01 DIAGNOSIS — E876 Hypokalemia: Secondary | ICD-10-CM | POA: Diagnosis not present

## 2015-04-03 DIAGNOSIS — D689 Coagulation defect, unspecified: Secondary | ICD-10-CM | POA: Diagnosis not present

## 2015-04-03 DIAGNOSIS — N186 End stage renal disease: Secondary | ICD-10-CM | POA: Diagnosis not present

## 2015-04-03 DIAGNOSIS — D631 Anemia in chronic kidney disease: Secondary | ICD-10-CM | POA: Diagnosis not present

## 2015-04-03 DIAGNOSIS — E876 Hypokalemia: Secondary | ICD-10-CM | POA: Diagnosis not present

## 2015-04-05 DIAGNOSIS — E876 Hypokalemia: Secondary | ICD-10-CM | POA: Diagnosis not present

## 2015-04-05 DIAGNOSIS — N186 End stage renal disease: Secondary | ICD-10-CM | POA: Diagnosis not present

## 2015-04-05 DIAGNOSIS — D631 Anemia in chronic kidney disease: Secondary | ICD-10-CM | POA: Diagnosis not present

## 2015-04-05 DIAGNOSIS — D689 Coagulation defect, unspecified: Secondary | ICD-10-CM | POA: Diagnosis not present

## 2015-04-08 DIAGNOSIS — N186 End stage renal disease: Secondary | ICD-10-CM | POA: Diagnosis not present

## 2015-04-08 DIAGNOSIS — D689 Coagulation defect, unspecified: Secondary | ICD-10-CM | POA: Diagnosis not present

## 2015-04-08 DIAGNOSIS — D631 Anemia in chronic kidney disease: Secondary | ICD-10-CM | POA: Diagnosis not present

## 2015-04-08 DIAGNOSIS — E876 Hypokalemia: Secondary | ICD-10-CM | POA: Diagnosis not present

## 2015-04-10 DIAGNOSIS — D689 Coagulation defect, unspecified: Secondary | ICD-10-CM | POA: Diagnosis not present

## 2015-04-10 DIAGNOSIS — D631 Anemia in chronic kidney disease: Secondary | ICD-10-CM | POA: Diagnosis not present

## 2015-04-10 DIAGNOSIS — E876 Hypokalemia: Secondary | ICD-10-CM | POA: Diagnosis not present

## 2015-04-10 DIAGNOSIS — N186 End stage renal disease: Secondary | ICD-10-CM | POA: Diagnosis not present

## 2015-04-12 DIAGNOSIS — N186 End stage renal disease: Secondary | ICD-10-CM | POA: Diagnosis not present

## 2015-04-12 DIAGNOSIS — D631 Anemia in chronic kidney disease: Secondary | ICD-10-CM | POA: Diagnosis not present

## 2015-04-12 DIAGNOSIS — D689 Coagulation defect, unspecified: Secondary | ICD-10-CM | POA: Diagnosis not present

## 2015-04-12 DIAGNOSIS — E876 Hypokalemia: Secondary | ICD-10-CM | POA: Diagnosis not present

## 2015-04-17 DIAGNOSIS — D631 Anemia in chronic kidney disease: Secondary | ICD-10-CM | POA: Diagnosis not present

## 2015-04-17 DIAGNOSIS — E876 Hypokalemia: Secondary | ICD-10-CM | POA: Diagnosis not present

## 2015-04-17 DIAGNOSIS — N186 End stage renal disease: Secondary | ICD-10-CM | POA: Diagnosis not present

## 2015-04-17 DIAGNOSIS — D689 Coagulation defect, unspecified: Secondary | ICD-10-CM | POA: Diagnosis not present

## 2015-04-19 DIAGNOSIS — D631 Anemia in chronic kidney disease: Secondary | ICD-10-CM | POA: Diagnosis not present

## 2015-04-19 DIAGNOSIS — E876 Hypokalemia: Secondary | ICD-10-CM | POA: Diagnosis not present

## 2015-04-19 DIAGNOSIS — D689 Coagulation defect, unspecified: Secondary | ICD-10-CM | POA: Diagnosis not present

## 2015-04-19 DIAGNOSIS — N186 End stage renal disease: Secondary | ICD-10-CM | POA: Diagnosis not present

## 2015-04-22 DIAGNOSIS — D631 Anemia in chronic kidney disease: Secondary | ICD-10-CM | POA: Diagnosis not present

## 2015-04-22 DIAGNOSIS — N186 End stage renal disease: Secondary | ICD-10-CM | POA: Diagnosis not present

## 2015-04-22 DIAGNOSIS — E876 Hypokalemia: Secondary | ICD-10-CM | POA: Diagnosis not present

## 2015-04-22 DIAGNOSIS — D689 Coagulation defect, unspecified: Secondary | ICD-10-CM | POA: Diagnosis not present

## 2015-04-24 DIAGNOSIS — D689 Coagulation defect, unspecified: Secondary | ICD-10-CM | POA: Diagnosis not present

## 2015-04-24 DIAGNOSIS — D631 Anemia in chronic kidney disease: Secondary | ICD-10-CM | POA: Diagnosis not present

## 2015-04-24 DIAGNOSIS — E876 Hypokalemia: Secondary | ICD-10-CM | POA: Diagnosis not present

## 2015-04-24 DIAGNOSIS — N186 End stage renal disease: Secondary | ICD-10-CM | POA: Diagnosis not present

## 2015-04-26 DIAGNOSIS — D631 Anemia in chronic kidney disease: Secondary | ICD-10-CM | POA: Diagnosis not present

## 2015-04-26 DIAGNOSIS — N186 End stage renal disease: Secondary | ICD-10-CM | POA: Diagnosis not present

## 2015-04-26 DIAGNOSIS — E876 Hypokalemia: Secondary | ICD-10-CM | POA: Diagnosis not present

## 2015-04-26 DIAGNOSIS — D689 Coagulation defect, unspecified: Secondary | ICD-10-CM | POA: Diagnosis not present

## 2015-04-27 DIAGNOSIS — Z992 Dependence on renal dialysis: Secondary | ICD-10-CM | POA: Diagnosis not present

## 2015-04-27 DIAGNOSIS — I120 Hypertensive chronic kidney disease with stage 5 chronic kidney disease or end stage renal disease: Secondary | ICD-10-CM | POA: Diagnosis not present

## 2015-04-27 DIAGNOSIS — N186 End stage renal disease: Secondary | ICD-10-CM | POA: Diagnosis not present

## 2015-04-29 DIAGNOSIS — D631 Anemia in chronic kidney disease: Secondary | ICD-10-CM | POA: Diagnosis not present

## 2015-04-29 DIAGNOSIS — N186 End stage renal disease: Secondary | ICD-10-CM | POA: Diagnosis not present

## 2015-04-29 DIAGNOSIS — E876 Hypokalemia: Secondary | ICD-10-CM | POA: Diagnosis not present

## 2015-04-29 DIAGNOSIS — D689 Coagulation defect, unspecified: Secondary | ICD-10-CM | POA: Diagnosis not present

## 2015-05-01 DIAGNOSIS — E876 Hypokalemia: Secondary | ICD-10-CM | POA: Diagnosis not present

## 2015-05-01 DIAGNOSIS — D631 Anemia in chronic kidney disease: Secondary | ICD-10-CM | POA: Diagnosis not present

## 2015-05-01 DIAGNOSIS — N186 End stage renal disease: Secondary | ICD-10-CM | POA: Diagnosis not present

## 2015-05-01 DIAGNOSIS — D689 Coagulation defect, unspecified: Secondary | ICD-10-CM | POA: Diagnosis not present

## 2015-05-03 DIAGNOSIS — E876 Hypokalemia: Secondary | ICD-10-CM | POA: Diagnosis not present

## 2015-05-03 DIAGNOSIS — N186 End stage renal disease: Secondary | ICD-10-CM | POA: Diagnosis not present

## 2015-05-03 DIAGNOSIS — D631 Anemia in chronic kidney disease: Secondary | ICD-10-CM | POA: Diagnosis not present

## 2015-05-03 DIAGNOSIS — D689 Coagulation defect, unspecified: Secondary | ICD-10-CM | POA: Diagnosis not present

## 2015-05-06 DIAGNOSIS — D631 Anemia in chronic kidney disease: Secondary | ICD-10-CM | POA: Diagnosis not present

## 2015-05-06 DIAGNOSIS — E876 Hypokalemia: Secondary | ICD-10-CM | POA: Diagnosis not present

## 2015-05-06 DIAGNOSIS — D689 Coagulation defect, unspecified: Secondary | ICD-10-CM | POA: Diagnosis not present

## 2015-05-06 DIAGNOSIS — N186 End stage renal disease: Secondary | ICD-10-CM | POA: Diagnosis not present

## 2015-05-08 DIAGNOSIS — D631 Anemia in chronic kidney disease: Secondary | ICD-10-CM | POA: Diagnosis not present

## 2015-05-08 DIAGNOSIS — E876 Hypokalemia: Secondary | ICD-10-CM | POA: Diagnosis not present

## 2015-05-08 DIAGNOSIS — N186 End stage renal disease: Secondary | ICD-10-CM | POA: Diagnosis not present

## 2015-05-08 DIAGNOSIS — D689 Coagulation defect, unspecified: Secondary | ICD-10-CM | POA: Diagnosis not present

## 2015-05-10 DIAGNOSIS — E876 Hypokalemia: Secondary | ICD-10-CM | POA: Diagnosis not present

## 2015-05-10 DIAGNOSIS — D689 Coagulation defect, unspecified: Secondary | ICD-10-CM | POA: Diagnosis not present

## 2015-05-10 DIAGNOSIS — D631 Anemia in chronic kidney disease: Secondary | ICD-10-CM | POA: Diagnosis not present

## 2015-05-10 DIAGNOSIS — N186 End stage renal disease: Secondary | ICD-10-CM | POA: Diagnosis not present

## 2015-05-13 DIAGNOSIS — D631 Anemia in chronic kidney disease: Secondary | ICD-10-CM | POA: Diagnosis not present

## 2015-05-13 DIAGNOSIS — N186 End stage renal disease: Secondary | ICD-10-CM | POA: Diagnosis not present

## 2015-05-13 DIAGNOSIS — D689 Coagulation defect, unspecified: Secondary | ICD-10-CM | POA: Diagnosis not present

## 2015-05-13 DIAGNOSIS — E876 Hypokalemia: Secondary | ICD-10-CM | POA: Diagnosis not present

## 2015-05-14 DIAGNOSIS — I519 Heart disease, unspecified: Secondary | ICD-10-CM | POA: Diagnosis not present

## 2015-05-14 DIAGNOSIS — R600 Localized edema: Secondary | ICD-10-CM | POA: Diagnosis not present

## 2015-05-14 DIAGNOSIS — N186 End stage renal disease: Secondary | ICD-10-CM | POA: Diagnosis not present

## 2015-05-15 DIAGNOSIS — N186 End stage renal disease: Secondary | ICD-10-CM | POA: Diagnosis not present

## 2015-05-15 DIAGNOSIS — D631 Anemia in chronic kidney disease: Secondary | ICD-10-CM | POA: Diagnosis not present

## 2015-05-15 DIAGNOSIS — D689 Coagulation defect, unspecified: Secondary | ICD-10-CM | POA: Diagnosis not present

## 2015-05-15 DIAGNOSIS — E876 Hypokalemia: Secondary | ICD-10-CM | POA: Diagnosis not present

## 2015-05-17 DIAGNOSIS — I871 Compression of vein: Secondary | ICD-10-CM | POA: Diagnosis not present

## 2015-05-17 DIAGNOSIS — E875 Hyperkalemia: Secondary | ICD-10-CM | POA: Diagnosis not present

## 2015-05-17 DIAGNOSIS — Z992 Dependence on renal dialysis: Secondary | ICD-10-CM | POA: Diagnosis not present

## 2015-05-17 DIAGNOSIS — N186 End stage renal disease: Secondary | ICD-10-CM | POA: Diagnosis not present

## 2015-05-17 DIAGNOSIS — T82858A Stenosis of vascular prosthetic devices, implants and grafts, initial encounter: Secondary | ICD-10-CM | POA: Diagnosis not present

## 2015-05-18 DIAGNOSIS — N186 End stage renal disease: Secondary | ICD-10-CM | POA: Diagnosis not present

## 2015-05-18 DIAGNOSIS — D689 Coagulation defect, unspecified: Secondary | ICD-10-CM | POA: Diagnosis not present

## 2015-05-18 DIAGNOSIS — E876 Hypokalemia: Secondary | ICD-10-CM | POA: Diagnosis not present

## 2015-05-18 DIAGNOSIS — D631 Anemia in chronic kidney disease: Secondary | ICD-10-CM | POA: Diagnosis not present

## 2015-05-20 DIAGNOSIS — E876 Hypokalemia: Secondary | ICD-10-CM | POA: Diagnosis not present

## 2015-05-20 DIAGNOSIS — D689 Coagulation defect, unspecified: Secondary | ICD-10-CM | POA: Diagnosis not present

## 2015-05-20 DIAGNOSIS — D631 Anemia in chronic kidney disease: Secondary | ICD-10-CM | POA: Diagnosis not present

## 2015-05-20 DIAGNOSIS — N186 End stage renal disease: Secondary | ICD-10-CM | POA: Diagnosis not present

## 2015-05-22 DIAGNOSIS — N186 End stage renal disease: Secondary | ICD-10-CM | POA: Diagnosis not present

## 2015-05-22 DIAGNOSIS — E876 Hypokalemia: Secondary | ICD-10-CM | POA: Diagnosis not present

## 2015-05-22 DIAGNOSIS — D631 Anemia in chronic kidney disease: Secondary | ICD-10-CM | POA: Diagnosis not present

## 2015-05-22 DIAGNOSIS — D689 Coagulation defect, unspecified: Secondary | ICD-10-CM | POA: Diagnosis not present

## 2015-05-24 DIAGNOSIS — I519 Heart disease, unspecified: Secondary | ICD-10-CM | POA: Diagnosis not present

## 2015-05-24 DIAGNOSIS — N186 End stage renal disease: Secondary | ICD-10-CM | POA: Diagnosis not present

## 2015-05-24 DIAGNOSIS — Z992 Dependence on renal dialysis: Secondary | ICD-10-CM | POA: Diagnosis not present

## 2015-05-25 DIAGNOSIS — D631 Anemia in chronic kidney disease: Secondary | ICD-10-CM | POA: Diagnosis not present

## 2015-05-25 DIAGNOSIS — D689 Coagulation defect, unspecified: Secondary | ICD-10-CM | POA: Diagnosis not present

## 2015-05-25 DIAGNOSIS — N186 End stage renal disease: Secondary | ICD-10-CM | POA: Diagnosis not present

## 2015-05-25 DIAGNOSIS — E876 Hypokalemia: Secondary | ICD-10-CM | POA: Diagnosis not present

## 2015-05-27 DIAGNOSIS — E876 Hypokalemia: Secondary | ICD-10-CM | POA: Diagnosis not present

## 2015-05-27 DIAGNOSIS — D631 Anemia in chronic kidney disease: Secondary | ICD-10-CM | POA: Diagnosis not present

## 2015-05-27 DIAGNOSIS — D689 Coagulation defect, unspecified: Secondary | ICD-10-CM | POA: Diagnosis not present

## 2015-05-27 DIAGNOSIS — N186 End stage renal disease: Secondary | ICD-10-CM | POA: Diagnosis not present

## 2015-05-28 DIAGNOSIS — N186 End stage renal disease: Secondary | ICD-10-CM | POA: Diagnosis not present

## 2015-05-28 DIAGNOSIS — Z992 Dependence on renal dialysis: Secondary | ICD-10-CM | POA: Diagnosis not present

## 2015-05-28 DIAGNOSIS — I120 Hypertensive chronic kidney disease with stage 5 chronic kidney disease or end stage renal disease: Secondary | ICD-10-CM | POA: Diagnosis not present

## 2015-05-29 DIAGNOSIS — N186 End stage renal disease: Secondary | ICD-10-CM | POA: Diagnosis not present

## 2015-05-29 DIAGNOSIS — E876 Hypokalemia: Secondary | ICD-10-CM | POA: Diagnosis not present

## 2015-05-29 DIAGNOSIS — D689 Coagulation defect, unspecified: Secondary | ICD-10-CM | POA: Diagnosis not present

## 2015-05-29 DIAGNOSIS — D631 Anemia in chronic kidney disease: Secondary | ICD-10-CM | POA: Diagnosis not present

## 2015-06-03 DIAGNOSIS — D631 Anemia in chronic kidney disease: Secondary | ICD-10-CM | POA: Diagnosis not present

## 2015-06-03 DIAGNOSIS — E876 Hypokalemia: Secondary | ICD-10-CM | POA: Diagnosis not present

## 2015-06-03 DIAGNOSIS — N186 End stage renal disease: Secondary | ICD-10-CM | POA: Diagnosis not present

## 2015-06-03 DIAGNOSIS — D689 Coagulation defect, unspecified: Secondary | ICD-10-CM | POA: Diagnosis not present

## 2015-06-05 DIAGNOSIS — N186 End stage renal disease: Secondary | ICD-10-CM | POA: Diagnosis not present

## 2015-06-05 DIAGNOSIS — E876 Hypokalemia: Secondary | ICD-10-CM | POA: Diagnosis not present

## 2015-06-05 DIAGNOSIS — D631 Anemia in chronic kidney disease: Secondary | ICD-10-CM | POA: Diagnosis not present

## 2015-06-05 DIAGNOSIS — D689 Coagulation defect, unspecified: Secondary | ICD-10-CM | POA: Diagnosis not present

## 2015-06-06 DIAGNOSIS — N186 End stage renal disease: Secondary | ICD-10-CM | POA: Diagnosis not present

## 2015-06-06 DIAGNOSIS — M810 Age-related osteoporosis without current pathological fracture: Secondary | ICD-10-CM | POA: Diagnosis not present

## 2015-06-06 DIAGNOSIS — Z6824 Body mass index (BMI) 24.0-24.9, adult: Secondary | ICD-10-CM | POA: Diagnosis not present

## 2015-06-07 DIAGNOSIS — E876 Hypokalemia: Secondary | ICD-10-CM | POA: Diagnosis not present

## 2015-06-07 DIAGNOSIS — D689 Coagulation defect, unspecified: Secondary | ICD-10-CM | POA: Diagnosis not present

## 2015-06-07 DIAGNOSIS — N186 End stage renal disease: Secondary | ICD-10-CM | POA: Diagnosis not present

## 2015-06-07 DIAGNOSIS — D631 Anemia in chronic kidney disease: Secondary | ICD-10-CM | POA: Diagnosis not present

## 2015-06-10 DIAGNOSIS — N186 End stage renal disease: Secondary | ICD-10-CM | POA: Diagnosis not present

## 2015-06-10 DIAGNOSIS — E876 Hypokalemia: Secondary | ICD-10-CM | POA: Diagnosis not present

## 2015-06-10 DIAGNOSIS — D631 Anemia in chronic kidney disease: Secondary | ICD-10-CM | POA: Diagnosis not present

## 2015-06-10 DIAGNOSIS — D689 Coagulation defect, unspecified: Secondary | ICD-10-CM | POA: Diagnosis not present

## 2015-06-11 DIAGNOSIS — R079 Chest pain, unspecified: Secondary | ICD-10-CM | POA: Diagnosis not present

## 2015-06-12 DIAGNOSIS — E876 Hypokalemia: Secondary | ICD-10-CM | POA: Diagnosis not present

## 2015-06-12 DIAGNOSIS — D631 Anemia in chronic kidney disease: Secondary | ICD-10-CM | POA: Diagnosis not present

## 2015-06-12 DIAGNOSIS — D689 Coagulation defect, unspecified: Secondary | ICD-10-CM | POA: Diagnosis not present

## 2015-06-12 DIAGNOSIS — N186 End stage renal disease: Secondary | ICD-10-CM | POA: Diagnosis not present

## 2015-06-14 DIAGNOSIS — D631 Anemia in chronic kidney disease: Secondary | ICD-10-CM | POA: Diagnosis not present

## 2015-06-14 DIAGNOSIS — D689 Coagulation defect, unspecified: Secondary | ICD-10-CM | POA: Diagnosis not present

## 2015-06-14 DIAGNOSIS — N186 End stage renal disease: Secondary | ICD-10-CM | POA: Diagnosis not present

## 2015-06-14 DIAGNOSIS — E876 Hypokalemia: Secondary | ICD-10-CM | POA: Diagnosis not present

## 2015-06-17 DIAGNOSIS — N186 End stage renal disease: Secondary | ICD-10-CM | POA: Diagnosis not present

## 2015-06-17 DIAGNOSIS — D689 Coagulation defect, unspecified: Secondary | ICD-10-CM | POA: Diagnosis not present

## 2015-06-17 DIAGNOSIS — D631 Anemia in chronic kidney disease: Secondary | ICD-10-CM | POA: Diagnosis not present

## 2015-06-17 DIAGNOSIS — E876 Hypokalemia: Secondary | ICD-10-CM | POA: Diagnosis not present

## 2015-06-19 DIAGNOSIS — E876 Hypokalemia: Secondary | ICD-10-CM | POA: Diagnosis not present

## 2015-06-19 DIAGNOSIS — D689 Coagulation defect, unspecified: Secondary | ICD-10-CM | POA: Diagnosis not present

## 2015-06-19 DIAGNOSIS — D631 Anemia in chronic kidney disease: Secondary | ICD-10-CM | POA: Diagnosis not present

## 2015-06-19 DIAGNOSIS — N186 End stage renal disease: Secondary | ICD-10-CM | POA: Diagnosis not present

## 2015-06-24 DIAGNOSIS — N186 End stage renal disease: Secondary | ICD-10-CM | POA: Diagnosis not present

## 2015-06-24 DIAGNOSIS — D631 Anemia in chronic kidney disease: Secondary | ICD-10-CM | POA: Diagnosis not present

## 2015-06-24 DIAGNOSIS — D689 Coagulation defect, unspecified: Secondary | ICD-10-CM | POA: Diagnosis not present

## 2015-06-24 DIAGNOSIS — E876 Hypokalemia: Secondary | ICD-10-CM | POA: Diagnosis not present

## 2015-06-25 DIAGNOSIS — Z992 Dependence on renal dialysis: Secondary | ICD-10-CM | POA: Diagnosis not present

## 2015-06-25 DIAGNOSIS — I120 Hypertensive chronic kidney disease with stage 5 chronic kidney disease or end stage renal disease: Secondary | ICD-10-CM | POA: Diagnosis not present

## 2015-06-25 DIAGNOSIS — N186 End stage renal disease: Secondary | ICD-10-CM | POA: Diagnosis not present

## 2015-06-26 DIAGNOSIS — E876 Hypokalemia: Secondary | ICD-10-CM | POA: Diagnosis not present

## 2015-06-26 DIAGNOSIS — D689 Coagulation defect, unspecified: Secondary | ICD-10-CM | POA: Diagnosis not present

## 2015-06-26 DIAGNOSIS — D631 Anemia in chronic kidney disease: Secondary | ICD-10-CM | POA: Diagnosis not present

## 2015-06-26 DIAGNOSIS — N186 End stage renal disease: Secondary | ICD-10-CM | POA: Diagnosis not present

## 2015-06-27 DIAGNOSIS — J343 Hypertrophy of nasal turbinates: Secondary | ICD-10-CM | POA: Diagnosis not present

## 2015-06-27 DIAGNOSIS — H9011 Conductive hearing loss, unilateral, right ear, with unrestricted hearing on the contralateral side: Secondary | ICD-10-CM | POA: Diagnosis not present

## 2015-06-27 DIAGNOSIS — H905 Unspecified sensorineural hearing loss: Secondary | ICD-10-CM | POA: Diagnosis not present

## 2015-06-28 DIAGNOSIS — D689 Coagulation defect, unspecified: Secondary | ICD-10-CM | POA: Diagnosis not present

## 2015-06-28 DIAGNOSIS — D631 Anemia in chronic kidney disease: Secondary | ICD-10-CM | POA: Diagnosis not present

## 2015-06-28 DIAGNOSIS — N186 End stage renal disease: Secondary | ICD-10-CM | POA: Diagnosis not present

## 2015-06-28 DIAGNOSIS — E876 Hypokalemia: Secondary | ICD-10-CM | POA: Diagnosis not present

## 2015-07-01 DIAGNOSIS — D689 Coagulation defect, unspecified: Secondary | ICD-10-CM | POA: Diagnosis not present

## 2015-07-01 DIAGNOSIS — N186 End stage renal disease: Secondary | ICD-10-CM | POA: Diagnosis not present

## 2015-07-01 DIAGNOSIS — D631 Anemia in chronic kidney disease: Secondary | ICD-10-CM | POA: Diagnosis not present

## 2015-07-01 DIAGNOSIS — E876 Hypokalemia: Secondary | ICD-10-CM | POA: Diagnosis not present

## 2015-07-03 DIAGNOSIS — N186 End stage renal disease: Secondary | ICD-10-CM | POA: Diagnosis not present

## 2015-07-03 DIAGNOSIS — D631 Anemia in chronic kidney disease: Secondary | ICD-10-CM | POA: Diagnosis not present

## 2015-07-03 DIAGNOSIS — D689 Coagulation defect, unspecified: Secondary | ICD-10-CM | POA: Diagnosis not present

## 2015-07-03 DIAGNOSIS — E876 Hypokalemia: Secondary | ICD-10-CM | POA: Diagnosis not present

## 2015-07-05 DIAGNOSIS — E876 Hypokalemia: Secondary | ICD-10-CM | POA: Diagnosis not present

## 2015-07-05 DIAGNOSIS — D689 Coagulation defect, unspecified: Secondary | ICD-10-CM | POA: Diagnosis not present

## 2015-07-05 DIAGNOSIS — D631 Anemia in chronic kidney disease: Secondary | ICD-10-CM | POA: Diagnosis not present

## 2015-07-05 DIAGNOSIS — N186 End stage renal disease: Secondary | ICD-10-CM | POA: Diagnosis not present

## 2015-07-08 DIAGNOSIS — E876 Hypokalemia: Secondary | ICD-10-CM | POA: Diagnosis not present

## 2015-07-08 DIAGNOSIS — N186 End stage renal disease: Secondary | ICD-10-CM | POA: Diagnosis not present

## 2015-07-08 DIAGNOSIS — D631 Anemia in chronic kidney disease: Secondary | ICD-10-CM | POA: Diagnosis not present

## 2015-07-08 DIAGNOSIS — D689 Coagulation defect, unspecified: Secondary | ICD-10-CM | POA: Diagnosis not present

## 2015-07-10 DIAGNOSIS — D689 Coagulation defect, unspecified: Secondary | ICD-10-CM | POA: Diagnosis not present

## 2015-07-10 DIAGNOSIS — E876 Hypokalemia: Secondary | ICD-10-CM | POA: Diagnosis not present

## 2015-07-10 DIAGNOSIS — N186 End stage renal disease: Secondary | ICD-10-CM | POA: Diagnosis not present

## 2015-07-10 DIAGNOSIS — D631 Anemia in chronic kidney disease: Secondary | ICD-10-CM | POA: Diagnosis not present

## 2015-07-11 DIAGNOSIS — H905 Unspecified sensorineural hearing loss: Secondary | ICD-10-CM | POA: Diagnosis not present

## 2015-07-11 DIAGNOSIS — H9011 Conductive hearing loss, unilateral, right ear, with unrestricted hearing on the contralateral side: Secondary | ICD-10-CM | POA: Diagnosis not present

## 2015-07-12 DIAGNOSIS — D631 Anemia in chronic kidney disease: Secondary | ICD-10-CM | POA: Diagnosis not present

## 2015-07-12 DIAGNOSIS — N186 End stage renal disease: Secondary | ICD-10-CM | POA: Diagnosis not present

## 2015-07-12 DIAGNOSIS — D689 Coagulation defect, unspecified: Secondary | ICD-10-CM | POA: Diagnosis not present

## 2015-07-12 DIAGNOSIS — E876 Hypokalemia: Secondary | ICD-10-CM | POA: Diagnosis not present

## 2015-07-15 DIAGNOSIS — D631 Anemia in chronic kidney disease: Secondary | ICD-10-CM | POA: Diagnosis not present

## 2015-07-15 DIAGNOSIS — N186 End stage renal disease: Secondary | ICD-10-CM | POA: Diagnosis not present

## 2015-07-15 DIAGNOSIS — D689 Coagulation defect, unspecified: Secondary | ICD-10-CM | POA: Diagnosis not present

## 2015-07-15 DIAGNOSIS — E876 Hypokalemia: Secondary | ICD-10-CM | POA: Diagnosis not present

## 2015-07-17 DIAGNOSIS — D631 Anemia in chronic kidney disease: Secondary | ICD-10-CM | POA: Diagnosis not present

## 2015-07-17 DIAGNOSIS — N186 End stage renal disease: Secondary | ICD-10-CM | POA: Diagnosis not present

## 2015-07-17 DIAGNOSIS — D689 Coagulation defect, unspecified: Secondary | ICD-10-CM | POA: Diagnosis not present

## 2015-07-17 DIAGNOSIS — E876 Hypokalemia: Secondary | ICD-10-CM | POA: Diagnosis not present

## 2015-07-19 DIAGNOSIS — E876 Hypokalemia: Secondary | ICD-10-CM | POA: Diagnosis not present

## 2015-07-19 DIAGNOSIS — D689 Coagulation defect, unspecified: Secondary | ICD-10-CM | POA: Diagnosis not present

## 2015-07-19 DIAGNOSIS — D631 Anemia in chronic kidney disease: Secondary | ICD-10-CM | POA: Diagnosis not present

## 2015-07-19 DIAGNOSIS — N186 End stage renal disease: Secondary | ICD-10-CM | POA: Diagnosis not present

## 2015-07-22 DIAGNOSIS — D631 Anemia in chronic kidney disease: Secondary | ICD-10-CM | POA: Diagnosis not present

## 2015-07-22 DIAGNOSIS — E876 Hypokalemia: Secondary | ICD-10-CM | POA: Diagnosis not present

## 2015-07-22 DIAGNOSIS — N186 End stage renal disease: Secondary | ICD-10-CM | POA: Diagnosis not present

## 2015-07-22 DIAGNOSIS — D689 Coagulation defect, unspecified: Secondary | ICD-10-CM | POA: Diagnosis not present

## 2015-07-24 DIAGNOSIS — N186 End stage renal disease: Secondary | ICD-10-CM | POA: Diagnosis not present

## 2015-07-24 DIAGNOSIS — D631 Anemia in chronic kidney disease: Secondary | ICD-10-CM | POA: Diagnosis not present

## 2015-07-24 DIAGNOSIS — E876 Hypokalemia: Secondary | ICD-10-CM | POA: Diagnosis not present

## 2015-07-24 DIAGNOSIS — D689 Coagulation defect, unspecified: Secondary | ICD-10-CM | POA: Diagnosis not present

## 2015-07-26 DIAGNOSIS — D689 Coagulation defect, unspecified: Secondary | ICD-10-CM | POA: Diagnosis not present

## 2015-07-26 DIAGNOSIS — I120 Hypertensive chronic kidney disease with stage 5 chronic kidney disease or end stage renal disease: Secondary | ICD-10-CM | POA: Diagnosis not present

## 2015-07-26 DIAGNOSIS — D631 Anemia in chronic kidney disease: Secondary | ICD-10-CM | POA: Diagnosis not present

## 2015-07-26 DIAGNOSIS — N186 End stage renal disease: Secondary | ICD-10-CM | POA: Diagnosis not present

## 2015-07-26 DIAGNOSIS — E876 Hypokalemia: Secondary | ICD-10-CM | POA: Diagnosis not present

## 2015-07-26 DIAGNOSIS — Z992 Dependence on renal dialysis: Secondary | ICD-10-CM | POA: Diagnosis not present

## 2015-07-30 DIAGNOSIS — Z992 Dependence on renal dialysis: Secondary | ICD-10-CM | POA: Diagnosis not present

## 2015-07-30 DIAGNOSIS — N186 End stage renal disease: Secondary | ICD-10-CM | POA: Diagnosis not present

## 2015-07-30 DIAGNOSIS — I120 Hypertensive chronic kidney disease with stage 5 chronic kidney disease or end stage renal disease: Secondary | ICD-10-CM | POA: Diagnosis not present

## 2015-07-30 DIAGNOSIS — J9811 Atelectasis: Secondary | ICD-10-CM | POA: Diagnosis not present

## 2015-07-30 DIAGNOSIS — R079 Chest pain, unspecified: Secondary | ICD-10-CM | POA: Diagnosis not present

## 2015-07-30 DIAGNOSIS — S279XXA Injury of unspecified intrathoracic organ, initial encounter: Secondary | ICD-10-CM | POA: Diagnosis not present

## 2015-07-30 DIAGNOSIS — S20212A Contusion of left front wall of thorax, initial encounter: Secondary | ICD-10-CM | POA: Diagnosis not present

## 2015-07-31 DIAGNOSIS — N186 End stage renal disease: Secondary | ICD-10-CM | POA: Diagnosis not present

## 2015-07-31 DIAGNOSIS — R197 Diarrhea, unspecified: Secondary | ICD-10-CM | POA: Diagnosis not present

## 2015-07-31 DIAGNOSIS — E876 Hypokalemia: Secondary | ICD-10-CM | POA: Diagnosis not present

## 2015-07-31 DIAGNOSIS — D631 Anemia in chronic kidney disease: Secondary | ICD-10-CM | POA: Diagnosis not present

## 2015-07-31 DIAGNOSIS — D689 Coagulation defect, unspecified: Secondary | ICD-10-CM | POA: Diagnosis not present

## 2015-08-02 DIAGNOSIS — N186 End stage renal disease: Secondary | ICD-10-CM | POA: Diagnosis not present

## 2015-08-02 DIAGNOSIS — R197 Diarrhea, unspecified: Secondary | ICD-10-CM | POA: Diagnosis not present

## 2015-08-02 DIAGNOSIS — D631 Anemia in chronic kidney disease: Secondary | ICD-10-CM | POA: Diagnosis not present

## 2015-08-02 DIAGNOSIS — D689 Coagulation defect, unspecified: Secondary | ICD-10-CM | POA: Diagnosis not present

## 2015-08-02 DIAGNOSIS — E876 Hypokalemia: Secondary | ICD-10-CM | POA: Diagnosis not present

## 2015-08-05 DIAGNOSIS — D631 Anemia in chronic kidney disease: Secondary | ICD-10-CM | POA: Diagnosis not present

## 2015-08-05 DIAGNOSIS — E876 Hypokalemia: Secondary | ICD-10-CM | POA: Diagnosis not present

## 2015-08-05 DIAGNOSIS — D689 Coagulation defect, unspecified: Secondary | ICD-10-CM | POA: Diagnosis not present

## 2015-08-05 DIAGNOSIS — N186 End stage renal disease: Secondary | ICD-10-CM | POA: Diagnosis not present

## 2015-08-05 DIAGNOSIS — R197 Diarrhea, unspecified: Secondary | ICD-10-CM | POA: Diagnosis not present

## 2015-08-07 DIAGNOSIS — I871 Compression of vein: Secondary | ICD-10-CM | POA: Diagnosis not present

## 2015-08-07 DIAGNOSIS — N186 End stage renal disease: Secondary | ICD-10-CM | POA: Diagnosis not present

## 2015-08-07 DIAGNOSIS — T82858D Stenosis of vascular prosthetic devices, implants and grafts, subsequent encounter: Secondary | ICD-10-CM | POA: Diagnosis not present

## 2015-08-07 DIAGNOSIS — Z992 Dependence on renal dialysis: Secondary | ICD-10-CM | POA: Diagnosis not present

## 2015-08-09 DIAGNOSIS — D631 Anemia in chronic kidney disease: Secondary | ICD-10-CM | POA: Diagnosis not present

## 2015-08-09 DIAGNOSIS — N186 End stage renal disease: Secondary | ICD-10-CM | POA: Diagnosis not present

## 2015-08-09 DIAGNOSIS — E876 Hypokalemia: Secondary | ICD-10-CM | POA: Diagnosis not present

## 2015-08-09 DIAGNOSIS — D689 Coagulation defect, unspecified: Secondary | ICD-10-CM | POA: Diagnosis not present

## 2015-08-09 DIAGNOSIS — R197 Diarrhea, unspecified: Secondary | ICD-10-CM | POA: Diagnosis not present

## 2015-08-12 DIAGNOSIS — D689 Coagulation defect, unspecified: Secondary | ICD-10-CM | POA: Diagnosis not present

## 2015-08-12 DIAGNOSIS — E876 Hypokalemia: Secondary | ICD-10-CM | POA: Diagnosis not present

## 2015-08-12 DIAGNOSIS — R197 Diarrhea, unspecified: Secondary | ICD-10-CM | POA: Diagnosis not present

## 2015-08-12 DIAGNOSIS — N186 End stage renal disease: Secondary | ICD-10-CM | POA: Diagnosis not present

## 2015-08-12 DIAGNOSIS — D631 Anemia in chronic kidney disease: Secondary | ICD-10-CM | POA: Diagnosis not present

## 2015-08-13 DIAGNOSIS — H9011 Conductive hearing loss, unilateral, right ear, with unrestricted hearing on the contralateral side: Secondary | ICD-10-CM | POA: Diagnosis not present

## 2015-08-13 DIAGNOSIS — H838X1 Other specified diseases of right inner ear: Secondary | ICD-10-CM | POA: Diagnosis not present

## 2015-08-14 DIAGNOSIS — N186 End stage renal disease: Secondary | ICD-10-CM | POA: Diagnosis not present

## 2015-08-14 DIAGNOSIS — R197 Diarrhea, unspecified: Secondary | ICD-10-CM | POA: Diagnosis not present

## 2015-08-14 DIAGNOSIS — D689 Coagulation defect, unspecified: Secondary | ICD-10-CM | POA: Diagnosis not present

## 2015-08-14 DIAGNOSIS — D631 Anemia in chronic kidney disease: Secondary | ICD-10-CM | POA: Diagnosis not present

## 2015-08-14 DIAGNOSIS — E876 Hypokalemia: Secondary | ICD-10-CM | POA: Diagnosis not present

## 2015-08-16 DIAGNOSIS — E876 Hypokalemia: Secondary | ICD-10-CM | POA: Diagnosis not present

## 2015-08-16 DIAGNOSIS — D631 Anemia in chronic kidney disease: Secondary | ICD-10-CM | POA: Diagnosis not present

## 2015-08-16 DIAGNOSIS — N186 End stage renal disease: Secondary | ICD-10-CM | POA: Diagnosis not present

## 2015-08-16 DIAGNOSIS — D689 Coagulation defect, unspecified: Secondary | ICD-10-CM | POA: Diagnosis not present

## 2015-08-16 DIAGNOSIS — R197 Diarrhea, unspecified: Secondary | ICD-10-CM | POA: Diagnosis not present

## 2015-08-19 DIAGNOSIS — E876 Hypokalemia: Secondary | ICD-10-CM | POA: Diagnosis not present

## 2015-08-19 DIAGNOSIS — R197 Diarrhea, unspecified: Secondary | ICD-10-CM | POA: Diagnosis not present

## 2015-08-19 DIAGNOSIS — N186 End stage renal disease: Secondary | ICD-10-CM | POA: Diagnosis not present

## 2015-08-19 DIAGNOSIS — D631 Anemia in chronic kidney disease: Secondary | ICD-10-CM | POA: Diagnosis not present

## 2015-08-19 DIAGNOSIS — D689 Coagulation defect, unspecified: Secondary | ICD-10-CM | POA: Diagnosis not present

## 2015-08-21 DIAGNOSIS — D689 Coagulation defect, unspecified: Secondary | ICD-10-CM | POA: Diagnosis not present

## 2015-08-21 DIAGNOSIS — R197 Diarrhea, unspecified: Secondary | ICD-10-CM | POA: Diagnosis not present

## 2015-08-21 DIAGNOSIS — N186 End stage renal disease: Secondary | ICD-10-CM | POA: Diagnosis not present

## 2015-08-21 DIAGNOSIS — E876 Hypokalemia: Secondary | ICD-10-CM | POA: Diagnosis not present

## 2015-08-21 DIAGNOSIS — D631 Anemia in chronic kidney disease: Secondary | ICD-10-CM | POA: Diagnosis not present

## 2015-08-23 DIAGNOSIS — D689 Coagulation defect, unspecified: Secondary | ICD-10-CM | POA: Diagnosis not present

## 2015-08-23 DIAGNOSIS — D631 Anemia in chronic kidney disease: Secondary | ICD-10-CM | POA: Diagnosis not present

## 2015-08-23 DIAGNOSIS — R197 Diarrhea, unspecified: Secondary | ICD-10-CM | POA: Diagnosis not present

## 2015-08-23 DIAGNOSIS — E876 Hypokalemia: Secondary | ICD-10-CM | POA: Diagnosis not present

## 2015-08-23 DIAGNOSIS — N186 End stage renal disease: Secondary | ICD-10-CM | POA: Diagnosis not present

## 2015-08-25 DIAGNOSIS — I120 Hypertensive chronic kidney disease with stage 5 chronic kidney disease or end stage renal disease: Secondary | ICD-10-CM | POA: Diagnosis not present

## 2015-08-25 DIAGNOSIS — N186 End stage renal disease: Secondary | ICD-10-CM | POA: Diagnosis not present

## 2015-08-25 DIAGNOSIS — Z992 Dependence on renal dialysis: Secondary | ICD-10-CM | POA: Diagnosis not present

## 2015-08-28 DIAGNOSIS — D689 Coagulation defect, unspecified: Secondary | ICD-10-CM | POA: Diagnosis not present

## 2015-08-28 DIAGNOSIS — N186 End stage renal disease: Secondary | ICD-10-CM | POA: Diagnosis not present

## 2015-08-28 DIAGNOSIS — R197 Diarrhea, unspecified: Secondary | ICD-10-CM | POA: Diagnosis not present

## 2015-08-28 DIAGNOSIS — E876 Hypokalemia: Secondary | ICD-10-CM | POA: Diagnosis not present

## 2015-08-28 DIAGNOSIS — D631 Anemia in chronic kidney disease: Secondary | ICD-10-CM | POA: Diagnosis not present

## 2015-08-30 DIAGNOSIS — E876 Hypokalemia: Secondary | ICD-10-CM | POA: Diagnosis not present

## 2015-08-30 DIAGNOSIS — D689 Coagulation defect, unspecified: Secondary | ICD-10-CM | POA: Diagnosis not present

## 2015-08-30 DIAGNOSIS — N186 End stage renal disease: Secondary | ICD-10-CM | POA: Diagnosis not present

## 2015-08-30 DIAGNOSIS — R197 Diarrhea, unspecified: Secondary | ICD-10-CM | POA: Diagnosis not present

## 2015-08-30 DIAGNOSIS — D631 Anemia in chronic kidney disease: Secondary | ICD-10-CM | POA: Diagnosis not present

## 2015-09-02 DIAGNOSIS — R197 Diarrhea, unspecified: Secondary | ICD-10-CM | POA: Diagnosis not present

## 2015-09-02 DIAGNOSIS — E876 Hypokalemia: Secondary | ICD-10-CM | POA: Diagnosis not present

## 2015-09-02 DIAGNOSIS — D631 Anemia in chronic kidney disease: Secondary | ICD-10-CM | POA: Diagnosis not present

## 2015-09-02 DIAGNOSIS — N186 End stage renal disease: Secondary | ICD-10-CM | POA: Diagnosis not present

## 2015-09-02 DIAGNOSIS — D689 Coagulation defect, unspecified: Secondary | ICD-10-CM | POA: Diagnosis not present

## 2015-09-04 DIAGNOSIS — D631 Anemia in chronic kidney disease: Secondary | ICD-10-CM | POA: Diagnosis not present

## 2015-09-04 DIAGNOSIS — R197 Diarrhea, unspecified: Secondary | ICD-10-CM | POA: Diagnosis not present

## 2015-09-04 DIAGNOSIS — E876 Hypokalemia: Secondary | ICD-10-CM | POA: Diagnosis not present

## 2015-09-04 DIAGNOSIS — N186 End stage renal disease: Secondary | ICD-10-CM | POA: Diagnosis not present

## 2015-09-04 DIAGNOSIS — D689 Coagulation defect, unspecified: Secondary | ICD-10-CM | POA: Diagnosis not present

## 2015-09-06 DIAGNOSIS — R197 Diarrhea, unspecified: Secondary | ICD-10-CM | POA: Diagnosis not present

## 2015-09-06 DIAGNOSIS — D689 Coagulation defect, unspecified: Secondary | ICD-10-CM | POA: Diagnosis not present

## 2015-09-06 DIAGNOSIS — E876 Hypokalemia: Secondary | ICD-10-CM | POA: Diagnosis not present

## 2015-09-06 DIAGNOSIS — D631 Anemia in chronic kidney disease: Secondary | ICD-10-CM | POA: Diagnosis not present

## 2015-09-06 DIAGNOSIS — N186 End stage renal disease: Secondary | ICD-10-CM | POA: Diagnosis not present

## 2015-09-09 DIAGNOSIS — D689 Coagulation defect, unspecified: Secondary | ICD-10-CM | POA: Diagnosis not present

## 2015-09-09 DIAGNOSIS — D631 Anemia in chronic kidney disease: Secondary | ICD-10-CM | POA: Diagnosis not present

## 2015-09-09 DIAGNOSIS — E876 Hypokalemia: Secondary | ICD-10-CM | POA: Diagnosis not present

## 2015-09-09 DIAGNOSIS — R197 Diarrhea, unspecified: Secondary | ICD-10-CM | POA: Diagnosis not present

## 2015-09-09 DIAGNOSIS — N186 End stage renal disease: Secondary | ICD-10-CM | POA: Diagnosis not present

## 2015-09-11 DIAGNOSIS — R197 Diarrhea, unspecified: Secondary | ICD-10-CM | POA: Diagnosis not present

## 2015-09-11 DIAGNOSIS — D631 Anemia in chronic kidney disease: Secondary | ICD-10-CM | POA: Diagnosis not present

## 2015-09-11 DIAGNOSIS — D689 Coagulation defect, unspecified: Secondary | ICD-10-CM | POA: Diagnosis not present

## 2015-09-11 DIAGNOSIS — E876 Hypokalemia: Secondary | ICD-10-CM | POA: Diagnosis not present

## 2015-09-11 DIAGNOSIS — N186 End stage renal disease: Secondary | ICD-10-CM | POA: Diagnosis not present

## 2015-09-13 DIAGNOSIS — N186 End stage renal disease: Secondary | ICD-10-CM | POA: Diagnosis not present

## 2015-09-13 DIAGNOSIS — D689 Coagulation defect, unspecified: Secondary | ICD-10-CM | POA: Diagnosis not present

## 2015-09-13 DIAGNOSIS — R197 Diarrhea, unspecified: Secondary | ICD-10-CM | POA: Diagnosis not present

## 2015-09-13 DIAGNOSIS — E876 Hypokalemia: Secondary | ICD-10-CM | POA: Diagnosis not present

## 2015-09-13 DIAGNOSIS — D631 Anemia in chronic kidney disease: Secondary | ICD-10-CM | POA: Diagnosis not present

## 2015-09-16 DIAGNOSIS — D631 Anemia in chronic kidney disease: Secondary | ICD-10-CM | POA: Diagnosis not present

## 2015-09-16 DIAGNOSIS — D689 Coagulation defect, unspecified: Secondary | ICD-10-CM | POA: Diagnosis not present

## 2015-09-16 DIAGNOSIS — R197 Diarrhea, unspecified: Secondary | ICD-10-CM | POA: Diagnosis not present

## 2015-09-16 DIAGNOSIS — E876 Hypokalemia: Secondary | ICD-10-CM | POA: Diagnosis not present

## 2015-09-16 DIAGNOSIS — N186 End stage renal disease: Secondary | ICD-10-CM | POA: Diagnosis not present

## 2015-09-17 DIAGNOSIS — Z992 Dependence on renal dialysis: Secondary | ICD-10-CM | POA: Diagnosis not present

## 2015-09-17 DIAGNOSIS — N186 End stage renal disease: Secondary | ICD-10-CM | POA: Diagnosis not present

## 2015-09-17 DIAGNOSIS — I953 Hypotension of hemodialysis: Secondary | ICD-10-CM | POA: Diagnosis not present

## 2015-09-18 DIAGNOSIS — D631 Anemia in chronic kidney disease: Secondary | ICD-10-CM | POA: Diagnosis not present

## 2015-09-18 DIAGNOSIS — R197 Diarrhea, unspecified: Secondary | ICD-10-CM | POA: Diagnosis not present

## 2015-09-18 DIAGNOSIS — D689 Coagulation defect, unspecified: Secondary | ICD-10-CM | POA: Diagnosis not present

## 2015-09-18 DIAGNOSIS — N186 End stage renal disease: Secondary | ICD-10-CM | POA: Diagnosis not present

## 2015-09-18 DIAGNOSIS — E876 Hypokalemia: Secondary | ICD-10-CM | POA: Diagnosis not present

## 2015-09-20 DIAGNOSIS — D631 Anemia in chronic kidney disease: Secondary | ICD-10-CM | POA: Diagnosis not present

## 2015-09-20 DIAGNOSIS — R197 Diarrhea, unspecified: Secondary | ICD-10-CM | POA: Diagnosis not present

## 2015-09-20 DIAGNOSIS — E876 Hypokalemia: Secondary | ICD-10-CM | POA: Diagnosis not present

## 2015-09-20 DIAGNOSIS — N186 End stage renal disease: Secondary | ICD-10-CM | POA: Diagnosis not present

## 2015-09-20 DIAGNOSIS — D689 Coagulation defect, unspecified: Secondary | ICD-10-CM | POA: Diagnosis not present

## 2015-09-23 DIAGNOSIS — D689 Coagulation defect, unspecified: Secondary | ICD-10-CM | POA: Diagnosis not present

## 2015-09-23 DIAGNOSIS — N186 End stage renal disease: Secondary | ICD-10-CM | POA: Diagnosis not present

## 2015-09-23 DIAGNOSIS — E876 Hypokalemia: Secondary | ICD-10-CM | POA: Diagnosis not present

## 2015-09-23 DIAGNOSIS — D631 Anemia in chronic kidney disease: Secondary | ICD-10-CM | POA: Diagnosis not present

## 2015-09-23 DIAGNOSIS — R197 Diarrhea, unspecified: Secondary | ICD-10-CM | POA: Diagnosis not present

## 2015-09-25 DIAGNOSIS — N186 End stage renal disease: Secondary | ICD-10-CM | POA: Diagnosis not present

## 2015-09-25 DIAGNOSIS — D689 Coagulation defect, unspecified: Secondary | ICD-10-CM | POA: Diagnosis not present

## 2015-09-25 DIAGNOSIS — D631 Anemia in chronic kidney disease: Secondary | ICD-10-CM | POA: Diagnosis not present

## 2015-09-25 DIAGNOSIS — E876 Hypokalemia: Secondary | ICD-10-CM | POA: Diagnosis not present

## 2015-09-25 DIAGNOSIS — I120 Hypertensive chronic kidney disease with stage 5 chronic kidney disease or end stage renal disease: Secondary | ICD-10-CM | POA: Diagnosis not present

## 2015-09-25 DIAGNOSIS — Z992 Dependence on renal dialysis: Secondary | ICD-10-CM | POA: Diagnosis not present

## 2015-09-25 DIAGNOSIS — R197 Diarrhea, unspecified: Secondary | ICD-10-CM | POA: Diagnosis not present

## 2015-09-30 DIAGNOSIS — R52 Pain, unspecified: Secondary | ICD-10-CM | POA: Diagnosis not present

## 2015-09-30 DIAGNOSIS — D631 Anemia in chronic kidney disease: Secondary | ICD-10-CM | POA: Diagnosis not present

## 2015-09-30 DIAGNOSIS — N186 End stage renal disease: Secondary | ICD-10-CM | POA: Diagnosis not present

## 2015-09-30 DIAGNOSIS — R197 Diarrhea, unspecified: Secondary | ICD-10-CM | POA: Diagnosis not present

## 2015-09-30 DIAGNOSIS — E876 Hypokalemia: Secondary | ICD-10-CM | POA: Diagnosis not present

## 2015-09-30 DIAGNOSIS — D689 Coagulation defect, unspecified: Secondary | ICD-10-CM | POA: Diagnosis not present

## 2015-10-02 DIAGNOSIS — E876 Hypokalemia: Secondary | ICD-10-CM | POA: Diagnosis not present

## 2015-10-02 DIAGNOSIS — D631 Anemia in chronic kidney disease: Secondary | ICD-10-CM | POA: Diagnosis not present

## 2015-10-02 DIAGNOSIS — R197 Diarrhea, unspecified: Secondary | ICD-10-CM | POA: Diagnosis not present

## 2015-10-02 DIAGNOSIS — R52 Pain, unspecified: Secondary | ICD-10-CM | POA: Diagnosis not present

## 2015-10-02 DIAGNOSIS — D689 Coagulation defect, unspecified: Secondary | ICD-10-CM | POA: Diagnosis not present

## 2015-10-02 DIAGNOSIS — N186 End stage renal disease: Secondary | ICD-10-CM | POA: Diagnosis not present

## 2015-10-04 DIAGNOSIS — D689 Coagulation defect, unspecified: Secondary | ICD-10-CM | POA: Diagnosis not present

## 2015-10-04 DIAGNOSIS — E876 Hypokalemia: Secondary | ICD-10-CM | POA: Diagnosis not present

## 2015-10-04 DIAGNOSIS — N186 End stage renal disease: Secondary | ICD-10-CM | POA: Diagnosis not present

## 2015-10-04 DIAGNOSIS — R197 Diarrhea, unspecified: Secondary | ICD-10-CM | POA: Diagnosis not present

## 2015-10-04 DIAGNOSIS — R52 Pain, unspecified: Secondary | ICD-10-CM | POA: Diagnosis not present

## 2015-10-04 DIAGNOSIS — D631 Anemia in chronic kidney disease: Secondary | ICD-10-CM | POA: Diagnosis not present

## 2015-10-07 DIAGNOSIS — N186 End stage renal disease: Secondary | ICD-10-CM | POA: Diagnosis not present

## 2015-10-07 DIAGNOSIS — R52 Pain, unspecified: Secondary | ICD-10-CM | POA: Diagnosis not present

## 2015-10-07 DIAGNOSIS — D631 Anemia in chronic kidney disease: Secondary | ICD-10-CM | POA: Diagnosis not present

## 2015-10-07 DIAGNOSIS — R197 Diarrhea, unspecified: Secondary | ICD-10-CM | POA: Diagnosis not present

## 2015-10-07 DIAGNOSIS — D689 Coagulation defect, unspecified: Secondary | ICD-10-CM | POA: Diagnosis not present

## 2015-10-07 DIAGNOSIS — E876 Hypokalemia: Secondary | ICD-10-CM | POA: Diagnosis not present

## 2015-10-09 DIAGNOSIS — R52 Pain, unspecified: Secondary | ICD-10-CM | POA: Diagnosis not present

## 2015-10-09 DIAGNOSIS — D689 Coagulation defect, unspecified: Secondary | ICD-10-CM | POA: Diagnosis not present

## 2015-10-09 DIAGNOSIS — N186 End stage renal disease: Secondary | ICD-10-CM | POA: Diagnosis not present

## 2015-10-09 DIAGNOSIS — R197 Diarrhea, unspecified: Secondary | ICD-10-CM | POA: Diagnosis not present

## 2015-10-09 DIAGNOSIS — E876 Hypokalemia: Secondary | ICD-10-CM | POA: Diagnosis not present

## 2015-10-09 DIAGNOSIS — D631 Anemia in chronic kidney disease: Secondary | ICD-10-CM | POA: Diagnosis not present

## 2015-10-10 DIAGNOSIS — D500 Iron deficiency anemia secondary to blood loss (chronic): Secondary | ICD-10-CM | POA: Diagnosis not present

## 2015-10-11 DIAGNOSIS — R197 Diarrhea, unspecified: Secondary | ICD-10-CM | POA: Diagnosis not present

## 2015-10-11 DIAGNOSIS — N186 End stage renal disease: Secondary | ICD-10-CM | POA: Diagnosis not present

## 2015-10-11 DIAGNOSIS — D631 Anemia in chronic kidney disease: Secondary | ICD-10-CM | POA: Diagnosis not present

## 2015-10-11 DIAGNOSIS — E876 Hypokalemia: Secondary | ICD-10-CM | POA: Diagnosis not present

## 2015-10-11 DIAGNOSIS — R52 Pain, unspecified: Secondary | ICD-10-CM | POA: Diagnosis not present

## 2015-10-11 DIAGNOSIS — D689 Coagulation defect, unspecified: Secondary | ICD-10-CM | POA: Diagnosis not present

## 2015-10-14 DIAGNOSIS — R197 Diarrhea, unspecified: Secondary | ICD-10-CM | POA: Diagnosis not present

## 2015-10-14 DIAGNOSIS — D631 Anemia in chronic kidney disease: Secondary | ICD-10-CM | POA: Diagnosis not present

## 2015-10-14 DIAGNOSIS — D689 Coagulation defect, unspecified: Secondary | ICD-10-CM | POA: Diagnosis not present

## 2015-10-14 DIAGNOSIS — R52 Pain, unspecified: Secondary | ICD-10-CM | POA: Diagnosis not present

## 2015-10-14 DIAGNOSIS — E876 Hypokalemia: Secondary | ICD-10-CM | POA: Diagnosis not present

## 2015-10-14 DIAGNOSIS — N186 End stage renal disease: Secondary | ICD-10-CM | POA: Diagnosis not present

## 2015-10-16 DIAGNOSIS — N186 End stage renal disease: Secondary | ICD-10-CM | POA: Diagnosis not present

## 2015-10-16 DIAGNOSIS — R197 Diarrhea, unspecified: Secondary | ICD-10-CM | POA: Diagnosis not present

## 2015-10-16 DIAGNOSIS — E876 Hypokalemia: Secondary | ICD-10-CM | POA: Diagnosis not present

## 2015-10-16 DIAGNOSIS — D689 Coagulation defect, unspecified: Secondary | ICD-10-CM | POA: Diagnosis not present

## 2015-10-16 DIAGNOSIS — R52 Pain, unspecified: Secondary | ICD-10-CM | POA: Diagnosis not present

## 2015-10-16 DIAGNOSIS — D631 Anemia in chronic kidney disease: Secondary | ICD-10-CM | POA: Diagnosis not present

## 2015-10-18 DIAGNOSIS — R52 Pain, unspecified: Secondary | ICD-10-CM | POA: Diagnosis not present

## 2015-10-18 DIAGNOSIS — N186 End stage renal disease: Secondary | ICD-10-CM | POA: Diagnosis not present

## 2015-10-18 DIAGNOSIS — E876 Hypokalemia: Secondary | ICD-10-CM | POA: Diagnosis not present

## 2015-10-18 DIAGNOSIS — R197 Diarrhea, unspecified: Secondary | ICD-10-CM | POA: Diagnosis not present

## 2015-10-18 DIAGNOSIS — D631 Anemia in chronic kidney disease: Secondary | ICD-10-CM | POA: Diagnosis not present

## 2015-10-18 DIAGNOSIS — D689 Coagulation defect, unspecified: Secondary | ICD-10-CM | POA: Diagnosis not present

## 2015-10-21 DIAGNOSIS — D631 Anemia in chronic kidney disease: Secondary | ICD-10-CM | POA: Diagnosis not present

## 2015-10-21 DIAGNOSIS — R197 Diarrhea, unspecified: Secondary | ICD-10-CM | POA: Diagnosis not present

## 2015-10-21 DIAGNOSIS — R52 Pain, unspecified: Secondary | ICD-10-CM | POA: Diagnosis not present

## 2015-10-21 DIAGNOSIS — E876 Hypokalemia: Secondary | ICD-10-CM | POA: Diagnosis not present

## 2015-10-21 DIAGNOSIS — D689 Coagulation defect, unspecified: Secondary | ICD-10-CM | POA: Diagnosis not present

## 2015-10-21 DIAGNOSIS — N186 End stage renal disease: Secondary | ICD-10-CM | POA: Diagnosis not present

## 2015-10-23 DIAGNOSIS — N186 End stage renal disease: Secondary | ICD-10-CM | POA: Diagnosis not present

## 2015-10-23 DIAGNOSIS — E876 Hypokalemia: Secondary | ICD-10-CM | POA: Diagnosis not present

## 2015-10-23 DIAGNOSIS — R197 Diarrhea, unspecified: Secondary | ICD-10-CM | POA: Diagnosis not present

## 2015-10-23 DIAGNOSIS — D689 Coagulation defect, unspecified: Secondary | ICD-10-CM | POA: Diagnosis not present

## 2015-10-23 DIAGNOSIS — D631 Anemia in chronic kidney disease: Secondary | ICD-10-CM | POA: Diagnosis not present

## 2015-10-23 DIAGNOSIS — R52 Pain, unspecified: Secondary | ICD-10-CM | POA: Diagnosis not present

## 2015-10-25 DIAGNOSIS — N186 End stage renal disease: Secondary | ICD-10-CM | POA: Diagnosis not present

## 2015-10-25 DIAGNOSIS — E876 Hypokalemia: Secondary | ICD-10-CM | POA: Diagnosis not present

## 2015-10-25 DIAGNOSIS — D631 Anemia in chronic kidney disease: Secondary | ICD-10-CM | POA: Diagnosis not present

## 2015-10-25 DIAGNOSIS — D689 Coagulation defect, unspecified: Secondary | ICD-10-CM | POA: Diagnosis not present

## 2015-10-25 DIAGNOSIS — R197 Diarrhea, unspecified: Secondary | ICD-10-CM | POA: Diagnosis not present

## 2015-10-25 DIAGNOSIS — I120 Hypertensive chronic kidney disease with stage 5 chronic kidney disease or end stage renal disease: Secondary | ICD-10-CM | POA: Diagnosis not present

## 2015-10-25 DIAGNOSIS — Z992 Dependence on renal dialysis: Secondary | ICD-10-CM | POA: Diagnosis not present

## 2015-10-25 DIAGNOSIS — R52 Pain, unspecified: Secondary | ICD-10-CM | POA: Diagnosis not present

## 2015-10-26 DIAGNOSIS — N186 End stage renal disease: Secondary | ICD-10-CM | POA: Diagnosis not present

## 2015-10-26 DIAGNOSIS — Z992 Dependence on renal dialysis: Secondary | ICD-10-CM | POA: Diagnosis not present

## 2015-10-26 DIAGNOSIS — I120 Hypertensive chronic kidney disease with stage 5 chronic kidney disease or end stage renal disease: Secondary | ICD-10-CM | POA: Diagnosis not present

## 2015-10-28 DIAGNOSIS — D689 Coagulation defect, unspecified: Secondary | ICD-10-CM | POA: Diagnosis not present

## 2015-10-28 DIAGNOSIS — D631 Anemia in chronic kidney disease: Secondary | ICD-10-CM | POA: Diagnosis not present

## 2015-10-28 DIAGNOSIS — E876 Hypokalemia: Secondary | ICD-10-CM | POA: Diagnosis not present

## 2015-10-28 DIAGNOSIS — N186 End stage renal disease: Secondary | ICD-10-CM | POA: Diagnosis not present

## 2015-10-30 DIAGNOSIS — N186 End stage renal disease: Secondary | ICD-10-CM | POA: Diagnosis not present

## 2015-10-30 DIAGNOSIS — D689 Coagulation defect, unspecified: Secondary | ICD-10-CM | POA: Diagnosis not present

## 2015-10-30 DIAGNOSIS — E876 Hypokalemia: Secondary | ICD-10-CM | POA: Diagnosis not present

## 2015-10-30 DIAGNOSIS — D631 Anemia in chronic kidney disease: Secondary | ICD-10-CM | POA: Diagnosis not present

## 2015-10-31 DIAGNOSIS — K3189 Other diseases of stomach and duodenum: Secondary | ICD-10-CM | POA: Diagnosis not present

## 2015-10-31 DIAGNOSIS — K449 Diaphragmatic hernia without obstruction or gangrene: Secondary | ICD-10-CM | POA: Diagnosis not present

## 2015-10-31 DIAGNOSIS — I129 Hypertensive chronic kidney disease with stage 1 through stage 4 chronic kidney disease, or unspecified chronic kidney disease: Secondary | ICD-10-CM | POA: Diagnosis not present

## 2015-10-31 DIAGNOSIS — Z79899 Other long term (current) drug therapy: Secondary | ICD-10-CM | POA: Diagnosis not present

## 2015-10-31 DIAGNOSIS — D509 Iron deficiency anemia, unspecified: Secondary | ICD-10-CM | POA: Diagnosis not present

## 2015-10-31 DIAGNOSIS — K222 Esophageal obstruction: Secondary | ICD-10-CM | POA: Diagnosis not present

## 2015-10-31 DIAGNOSIS — E119 Type 2 diabetes mellitus without complications: Secondary | ICD-10-CM | POA: Diagnosis not present

## 2015-10-31 DIAGNOSIS — Z992 Dependence on renal dialysis: Secondary | ICD-10-CM | POA: Diagnosis not present

## 2015-10-31 DIAGNOSIS — R195 Other fecal abnormalities: Secondary | ICD-10-CM | POA: Diagnosis not present

## 2015-11-01 ENCOUNTER — Emergency Department (HOSPITAL_COMMUNITY): Payer: Medicare Other

## 2015-11-01 ENCOUNTER — Encounter (HOSPITAL_COMMUNITY): Payer: Self-pay

## 2015-11-01 ENCOUNTER — Observation Stay (HOSPITAL_COMMUNITY)
Admission: EM | Admit: 2015-11-01 | Discharge: 2015-11-03 | Disposition: A | Discharge status: Home Health | Payer: Medicare Other | Attending: Internal Medicine | Admitting: Internal Medicine

## 2015-11-01 DIAGNOSIS — E1122 Type 2 diabetes mellitus with diabetic chronic kidney disease: Secondary | ICD-10-CM | POA: Diagnosis not present

## 2015-11-01 DIAGNOSIS — W19XXXA Unspecified fall, initial encounter: Secondary | ICD-10-CM | POA: Insufficient documentation

## 2015-11-01 DIAGNOSIS — N390 Urinary tract infection, site not specified: Secondary | ICD-10-CM | POA: Diagnosis not present

## 2015-11-01 DIAGNOSIS — D649 Anemia, unspecified: Secondary | ICD-10-CM | POA: Diagnosis not present

## 2015-11-01 DIAGNOSIS — W1839XA Other fall on same level, initial encounter: Secondary | ICD-10-CM | POA: Diagnosis not present

## 2015-11-01 DIAGNOSIS — R269 Unspecified abnormalities of gait and mobility: Secondary | ICD-10-CM | POA: Diagnosis not present

## 2015-11-01 DIAGNOSIS — I959 Hypotension, unspecified: Secondary | ICD-10-CM | POA: Insufficient documentation

## 2015-11-01 DIAGNOSIS — Z6821 Body mass index (BMI) 21.0-21.9, adult: Secondary | ICD-10-CM | POA: Insufficient documentation

## 2015-11-01 DIAGNOSIS — R410 Disorientation, unspecified: Secondary | ICD-10-CM | POA: Insufficient documentation

## 2015-11-01 DIAGNOSIS — T148 Other injury of unspecified body region: Secondary | ICD-10-CM | POA: Diagnosis not present

## 2015-11-01 DIAGNOSIS — N186 End stage renal disease: Secondary | ICD-10-CM | POA: Diagnosis not present

## 2015-11-01 DIAGNOSIS — I132 Hypertensive heart and chronic kidney disease with heart failure and with stage 5 chronic kidney disease, or end stage renal disease: Secondary | ICD-10-CM | POA: Insufficient documentation

## 2015-11-01 DIAGNOSIS — E43 Unspecified severe protein-calorie malnutrition: Secondary | ICD-10-CM | POA: Insufficient documentation

## 2015-11-01 DIAGNOSIS — M109 Gout, unspecified: Secondary | ICD-10-CM | POA: Insufficient documentation

## 2015-11-01 DIAGNOSIS — Z87891 Personal history of nicotine dependence: Secondary | ICD-10-CM | POA: Diagnosis not present

## 2015-11-01 DIAGNOSIS — R262 Difficulty in walking, not elsewhere classified: Secondary | ICD-10-CM | POA: Insufficient documentation

## 2015-11-01 DIAGNOSIS — E876 Hypokalemia: Secondary | ICD-10-CM | POA: Diagnosis not present

## 2015-11-01 DIAGNOSIS — Z880 Allergy status to penicillin: Secondary | ICD-10-CM | POA: Diagnosis not present

## 2015-11-01 DIAGNOSIS — Y9389 Activity, other specified: Secondary | ICD-10-CM | POA: Diagnosis not present

## 2015-11-01 DIAGNOSIS — Z79899 Other long term (current) drug therapy: Secondary | ICD-10-CM | POA: Insufficient documentation

## 2015-11-01 DIAGNOSIS — I509 Heart failure, unspecified: Secondary | ICD-10-CM | POA: Insufficient documentation

## 2015-11-01 DIAGNOSIS — D696 Thrombocytopenia, unspecified: Secondary | ICD-10-CM | POA: Insufficient documentation

## 2015-11-01 DIAGNOSIS — H9190 Unspecified hearing loss, unspecified ear: Secondary | ICD-10-CM | POA: Diagnosis not present

## 2015-11-01 DIAGNOSIS — R531 Weakness: Secondary | ICD-10-CM | POA: Diagnosis not present

## 2015-11-01 DIAGNOSIS — R0781 Pleurodynia: Secondary | ICD-10-CM | POA: Diagnosis not present

## 2015-11-01 DIAGNOSIS — I9589 Other hypotension: Secondary | ICD-10-CM | POA: Diagnosis not present

## 2015-11-01 DIAGNOSIS — Z833 Family history of diabetes mellitus: Secondary | ICD-10-CM | POA: Diagnosis not present

## 2015-11-01 DIAGNOSIS — S2232XA Fracture of one rib, left side, initial encounter for closed fracture: Secondary | ICD-10-CM | POA: Diagnosis not present

## 2015-11-01 DIAGNOSIS — Z7902 Long term (current) use of antithrombotics/antiplatelets: Secondary | ICD-10-CM | POA: Diagnosis not present

## 2015-11-01 DIAGNOSIS — E8889 Other specified metabolic disorders: Secondary | ICD-10-CM | POA: Diagnosis not present

## 2015-11-01 DIAGNOSIS — Z992 Dependence on renal dialysis: Secondary | ICD-10-CM | POA: Diagnosis not present

## 2015-11-01 DIAGNOSIS — R4182 Altered mental status, unspecified: Secondary | ICD-10-CM | POA: Diagnosis not present

## 2015-11-01 DIAGNOSIS — K219 Gastro-esophageal reflux disease without esophagitis: Secondary | ICD-10-CM | POA: Diagnosis present

## 2015-11-01 DIAGNOSIS — I10 Essential (primary) hypertension: Secondary | ICD-10-CM | POA: Diagnosis present

## 2015-11-01 DIAGNOSIS — E782 Mixed hyperlipidemia: Secondary | ICD-10-CM | POA: Diagnosis not present

## 2015-11-01 DIAGNOSIS — Z841 Family history of disorders of kidney and ureter: Secondary | ICD-10-CM | POA: Diagnosis not present

## 2015-11-01 DIAGNOSIS — R748 Abnormal levels of other serum enzymes: Secondary | ICD-10-CM | POA: Diagnosis not present

## 2015-11-01 DIAGNOSIS — E872 Acidosis: Secondary | ICD-10-CM | POA: Diagnosis not present

## 2015-11-01 DIAGNOSIS — I480 Paroxysmal atrial fibrillation: Secondary | ICD-10-CM | POA: Diagnosis present

## 2015-11-01 DIAGNOSIS — Y92003 Bedroom of unspecified non-institutional (private) residence as the place of occurrence of the external cause: Secondary | ICD-10-CM | POA: Diagnosis not present

## 2015-11-01 DIAGNOSIS — B9689 Other specified bacterial agents as the cause of diseases classified elsewhere: Secondary | ICD-10-CM | POA: Diagnosis not present

## 2015-11-01 DIAGNOSIS — S2239XA Fracture of one rib, unspecified side, initial encounter for closed fracture: Secondary | ICD-10-CM | POA: Insufficient documentation

## 2015-11-01 DIAGNOSIS — R296 Repeated falls: Secondary | ICD-10-CM | POA: Diagnosis not present

## 2015-11-01 DIAGNOSIS — S2242XA Multiple fractures of ribs, left side, initial encounter for closed fracture: Secondary | ICD-10-CM | POA: Diagnosis not present

## 2015-11-01 DIAGNOSIS — S2232XD Fracture of one rib, left side, subsequent encounter for fracture with routine healing: Secondary | ICD-10-CM | POA: Diagnosis not present

## 2015-11-01 DIAGNOSIS — N2581 Secondary hyperparathyroidism of renal origin: Secondary | ICD-10-CM | POA: Diagnosis not present

## 2015-11-01 DIAGNOSIS — I5032 Chronic diastolic (congestive) heart failure: Secondary | ICD-10-CM | POA: Diagnosis not present

## 2015-11-01 LAB — BASIC METABOLIC PANEL
Anion gap: 10 (ref 5–15)
BUN: 7 mg/dL (ref 6–20)
CALCIUM: 8.7 mg/dL — AB (ref 8.9–10.3)
CO2: 27 mmol/L (ref 22–32)
CREATININE: 4.37 mg/dL — AB (ref 0.44–1.00)
Chloride: 101 mmol/L (ref 101–111)
GFR calc non Af Amer: 8 mL/min — ABNORMAL LOW (ref >60)
GFR, EST AFRICAN AMERICAN: 10 mL/min — AB (ref >60)
Glucose, Bld: 101 mg/dL — ABNORMAL HIGH (ref 65–99)
Potassium: 4.2 mmol/L (ref 3.5–5.1)
SODIUM: 138 mmol/L (ref 135–145)

## 2015-11-01 LAB — CBC WITH DIFFERENTIAL/PLATELET
BASOS PCT: 0 %
Basophils Absolute: 0 10*3/uL (ref 0.0–0.1)
EOS ABS: 0 10*3/uL (ref 0.0–0.7)
EOS PCT: 0 %
HCT: 34.7 % — ABNORMAL LOW (ref 36.0–46.0)
HEMOGLOBIN: 11 g/dL — AB (ref 12.0–15.0)
Lymphocytes Relative: 7 %
Lymphs Abs: 0.7 10*3/uL (ref 0.7–4.0)
MCH: 29.3 pg (ref 26.0–34.0)
MCHC: 31.7 g/dL (ref 30.0–36.0)
MCV: 92.3 fL (ref 78.0–100.0)
MONOS PCT: 5 %
Monocytes Absolute: 0.5 10*3/uL (ref 0.1–1.0)
NEUTROS PCT: 88 %
Neutro Abs: 9.2 10*3/uL — ABNORMAL HIGH (ref 1.7–7.7)
PLATELETS: 61 10*3/uL — AB (ref 150–400)
RBC: 3.76 MIL/uL — AB (ref 3.87–5.11)
RDW: 19 % — ABNORMAL HIGH (ref 11.5–15.5)
WBC: 10.4 10*3/uL (ref 4.0–10.5)

## 2015-11-01 LAB — PROTIME-INR
INR: 1.93 — AB (ref 0.00–1.49)
PROTHROMBIN TIME: 21.9 s — AB (ref 11.6–15.2)

## 2015-11-01 MED ORDER — HEPARIN SODIUM (PORCINE) 5000 UNIT/ML IJ SOLN
5000.0000 [IU] | Freq: Three times a day (TID) | INTRAMUSCULAR | Status: DC
  Filled 2015-11-01 (×2): qty 1

## 2015-11-01 MED ORDER — ACETAMINOPHEN 650 MG RE SUPP: 650.0000 mg | Freq: Four times a day (QID) | RECTAL | Status: DC | PRN

## 2015-11-01 MED ORDER — CINACALCET HCL 30 MG PO TABS
30.0000 mg | ORAL_TABLET | Freq: Every day | ORAL | Status: DC
  Administered 2015-11-02 – 2015-11-03 (×2): 30 mg via ORAL
  Filled 2015-11-01 (×4): qty 1

## 2015-11-01 MED ORDER — ALLOPURINOL 100 MG PO TABS
100.0000 mg | ORAL_TABLET | Freq: Every day | ORAL | Status: DC
  Administered 2015-11-01 – 2015-11-02 (×2): 100 mg via ORAL
  Filled 2015-11-01 (×2): qty 1

## 2015-11-01 MED ORDER — PANTOPRAZOLE SODIUM 40 MG PO TBEC
40.0000 mg | DELAYED_RELEASE_TABLET | Freq: Every day | ORAL | Status: DC
Start: 2015-11-01 — End: 2015-11-02
  Administered 2015-11-01: 40 mg via ORAL
  Filled 2015-11-01: qty 1

## 2015-11-01 MED ORDER — SEVELAMER CARBONATE 800 MG PO TABS
800.0000 mg | ORAL_TABLET | Freq: Every day | ORAL | Status: DC
Start: 2015-11-01 — End: 2015-11-03
  Administered 2015-11-02 – 2015-11-03 (×2): 800 mg via ORAL
  Filled 2015-11-01 (×3): qty 1

## 2015-11-01 MED ORDER — FENTANYL CITRATE (PF) 100 MCG/2ML IJ SOLN
50.0000 ug | Freq: Once | INTRAMUSCULAR | Status: AC
  Administered 2015-11-01: 50 ug via NASAL
  Filled 2015-11-01: qty 2

## 2015-11-01 MED ORDER — MIDODRINE HCL 5 MG PO TABS
10.0000 mg | ORAL_TABLET | Freq: Every day | ORAL | Status: DC
  Filled 2015-11-01: qty 2

## 2015-11-01 MED ORDER — MIDODRINE HCL 5 MG PO TABS
10.0000 mg | ORAL_TABLET | Freq: Every day | ORAL | Status: DC | PRN
  Administered 2015-11-01: 10 mg via ORAL
  Filled 2015-11-01: qty 2

## 2015-11-01 MED ORDER — VITAMIN B-6 100 MG PO TABS
100.0000 mg | ORAL_TABLET | Freq: Every day | ORAL | Status: DC
  Administered 2015-11-02: 100 mg via ORAL
  Filled 2015-11-01 (×3): qty 1

## 2015-11-01 MED ORDER — LIDOCAINE-PRILOCAINE 2.5-2.5 % EX CREA: 1.0000 "application " | TOPICAL_CREAM | CUTANEOUS | Status: DC | PRN

## 2015-11-01 MED ORDER — ALTEPLASE 2 MG IJ SOLR: 2.0000 mg | Freq: Once | INTRAMUSCULAR | Status: DC | PRN

## 2015-11-01 MED ORDER — LORATADINE 10 MG PO TABS
10.0000 mg | ORAL_TABLET | Freq: Every day | ORAL | Status: DC
  Administered 2015-11-01 – 2015-11-02 (×2): 10 mg via ORAL
  Filled 2015-11-01 (×2): qty 1

## 2015-11-01 MED ORDER — RENA-VITE PO TABS
1.0000 | ORAL_TABLET | Freq: Every day | ORAL | Status: DC
  Administered 2015-11-01 – 2015-11-02 (×2): 1 via ORAL
  Filled 2015-11-01 (×2): qty 1

## 2015-11-01 MED ORDER — PENTAFLUOROPROP-TETRAFLUOROETH EX AERO: 1.0000 "application " | INHALATION_SPRAY | CUTANEOUS | Status: DC | PRN

## 2015-11-01 MED ORDER — ASPIRIN EC 81 MG PO TBEC
81.0000 mg | DELAYED_RELEASE_TABLET | Freq: Every day | ORAL | Status: DC
  Administered 2015-11-02 – 2015-11-03 (×2): 81 mg via ORAL
  Filled 2015-11-01 (×3): qty 1

## 2015-11-01 MED ORDER — FENTANYL CITRATE (PF) 100 MCG/2ML IJ SOLN: 50.0000 ug | Freq: Once | INTRAMUSCULAR | Status: DC

## 2015-11-01 MED ORDER — VITAMIN D 1000 UNITS PO TABS: 1000.0000 [IU] | ORAL_TABLET | Freq: Every day | ORAL | Status: DC

## 2015-11-01 MED ORDER — OXYCODONE-ACETAMINOPHEN 5-325 MG PO TABS
1.0000 | ORAL_TABLET | ORAL | Status: DC | PRN
  Administered 2015-11-01 (×2): 2 via ORAL
  Filled 2015-11-01 (×2): qty 2

## 2015-11-01 MED ORDER — VITAMIN B-1 100 MG PO TABS
100.0000 mg | ORAL_TABLET | Freq: Every day | ORAL | Status: DC
Start: 2015-11-01 — End: 2015-11-03
  Administered 2015-11-01 – 2015-11-02 (×2): 100 mg via ORAL
  Filled 2015-11-01 (×2): qty 1

## 2015-11-01 MED ORDER — MORPHINE SULFATE (PF) 4 MG/ML IV SOLN: 4.0000 mg | Freq: Once | INTRAVENOUS | Status: DC

## 2015-11-01 MED ORDER — HEPARIN SODIUM (PORCINE) 1000 UNIT/ML DIALYSIS: 1000.0000 [IU] | INTRAMUSCULAR | Status: DC | PRN

## 2015-11-01 MED ORDER — SODIUM CHLORIDE 0.9 % IV SOLN: 100.0000 mL | INTRAVENOUS | Status: DC | PRN

## 2015-11-01 MED ORDER — FENTANYL CITRATE (PF) 100 MCG/2ML IJ SOLN: 50.0000 ug | INTRAMUSCULAR | Status: DC | PRN

## 2015-11-01 MED ORDER — CLOPIDOGREL BISULFATE 75 MG PO TABS
75.0000 mg | ORAL_TABLET | Freq: Every day | ORAL | Status: DC
  Administered 2015-11-02 – 2015-11-03 (×2): 75 mg via ORAL
  Filled 2015-11-01 (×3): qty 1

## 2015-11-01 MED ORDER — LIDOCAINE HCL (PF) 1 % IJ SOLN: 5.0000 mL | INTRAMUSCULAR | Status: DC | PRN

## 2015-11-01 MED ORDER — SODIUM CHLORIDE 0.9 % IV SOLN: INTRAVENOUS | Status: DC

## 2015-11-01 MED ORDER — ACETAMINOPHEN 325 MG PO TABS: 650.0000 mg | ORAL_TABLET | Freq: Four times a day (QID) | ORAL | Status: DC | PRN

## 2015-11-01 NOTE — ED Provider Notes (Signed)
CSN: [id number]     Arrival date & time    History   First MD Initiated Contact with Patient 11/01/15 0700     Chief Complaint  Patient presents with  . Rib Injury    left   HPI Comments: Pt was staying at her daughters house last night.  She went to roll out of bed on her own and fell hitting her left side on the night stand.  She has severe pain in the left posterior rib area.  No loc.  No head injury.  No neck or back pain  Patient is a 82 y.o. female presenting with fall. The history is provided by the patient.  Fall This is a new problem. The current episode started less than 1 hour ago. Pertinent negatives include no shortness of breath. Associated symptoms comments: Rib pain on the left . Exacerbated by: movement and palpation. Nothing relieves the symptoms. Treatments tried: tylenol 3. The treatment provided no relief.  Pt and family states her blood pressure is normally in the 80s and 90s.    Past Medical History  Diagnosis Date  . Chest pain 07/07/10  . Hypertension   . Edema 07/24/10    of ankles  . Generalized headaches 03/12  . Lightheadedness 07/24/10  . Reflux   . Fatigue   . Arthritis   . Gout   . Hyperglycemia     Borderline diabetes  . Peptic ulcer disease   . AF (paroxysmal atrial fibrillation) (HCC)     Used to be on Warfarin in the past but was taken off due to falls, memory problems.  . CHF (congestive heart failure) (HCC)     30 years ago  . GERD (gastroesophageal reflux disease)   . Diabetes mellitus without complication (HCC)     borderline    . End stage renal disease on dialysis [facility](HCC) since 2010    related to Vioxx, HD T-TH-SAT   Past Surgical History  Procedure Laterality Date  . Hemodialysis catheter      & Placement left forearm AV graft   . Arteriovenous graft placement    . Abdominal hysterectomy    . Cholecystectomy    . Eye surgery      bilateral cataracts  . Revision of arteriovenous goretex graft Left 01/06/2013    Procedure:  REVISION OF ARTERIOVENOUS GORETEX GRAFT;  Surgeon: Chuck Hinthristopher S Dickson, MD;  [facility];  Service: Vascular;  Laterality: Left;   Family History  Problem Relation Age of Onset  . Cancer Father 3377    cancer  . Other Mother     mother died from burns  . Kidney disease Sister   . CAD Neg Hx    Social History  Substance Use Topics  . Smoking status: Former Smoker    Types: Cigarettes    Quit date: 04/28/1979  . Smokeless tobacco: Never Used     Comment: rare - 1-2x/week, quit 30 yrs ago  . Alcohol Use: No   OB History    No data available     Review of Systems  Constitutional: Negative for fever.  Respiratory: Negative for shortness of breath.   Gastrointestinal: Negative for vomiting and diarrhea.  Skin: Negative for rash.      Allergies  Aminophylline; Clarithromycin; Penicillins; and Sulfa antibiotics  Home Medications   Prior to Admission medications   Medication Sig Start Date End Date Taking? Authorizing Provider  allopurinol (ZYLOPRIM) 100 MG tablet Take 100 mg by mouth daily.  Historical Provider, MD  aspirin 81 MG tablet Take 81 mg by mouth daily.      Historical Provider, MD  clopidogrel (PLAVIX) 75 MG tablet Take 75 mg by mouth daily. Holding 5 days pre-op    Historical Provider, MD  colchicine 0.6 MG tablet Take 0.6 mg by mouth daily.    Historical Provider, MD  oxyCODONE (OXY IR/ROXICODONE) 5 MG immediate release tablet Take 5 mg by mouth every 8 (eight) hours as needed for pain.    Historical Provider, MD  oxyCODONE-acetaminophen (ROXICET) 5-325 MG per tablet Take 1 tablet by mouth every 4 (four) hours as needed for pain. 01/06/13   Chuck Hint, MD  pantoprazole (PROTONIX) 40 MG tablet Take 40 mg by mouth daily.      Historical Provider, MD  pyridOXINE (VITAMIN B-6) 100 MG tablet Take 100 mg by mouth daily.      Historical Provider, MD  ranitidine (ZANTAC) 150 MG tablet Take 150 mg by mouth daily.     Historical Provider, MD  sevelamer  (RENVELA) 800 MG tablet Take 800 mg by mouth 2 (two) times daily with a meal.     Historical Provider, MD  thiamine (VITAMIN B-1) 100 MG tablet Take 100 mg by mouth daily.    Historical Provider, MD   BP 93/53 mmHg  Pulse 87  Temp(Src) 98.4 F (36.9 C) (Oral)  Resp 14  SpO2 96% Physical Exam  Constitutional: No distress.  HENT:  Head: Normocephalic and atraumatic.  Right Ear: External ear normal.  Left Ear: External ear normal.  Eyes: Conjunctivae are normal. Right eye exhibits no discharge. Left eye exhibits no discharge. No scleral icterus.  Neck: Neck supple. No tracheal deviation present.  Cardiovascular: Normal rate, regular rhythm and intact distal pulses.   Pulmonary/Chest: Effort normal and breath sounds normal. No stridor. No respiratory distress. She has no wheezes. She has no rales. She exhibits tenderness (left posterior rib).  Abdominal: Soft. Bowel sounds are normal. She exhibits no distension. There is no tenderness. There is no rebound and no guarding.  Musculoskeletal: She exhibits no edema or tenderness.       Cervical back: Normal.       Thoracic back: Normal.       Lumbar back: Normal.  Neurological: She is alert. She has normal strength. No cranial nerve deficit (no facial droop, extraocular movements intact, no slurred speech) or sensory deficit. She exhibits normal muscle tone. She displays no seizure activity. Coordination normal.  Skin: Skin is warm and dry. No rash noted. She is not diaphoretic.  Psychiatric: She has a normal mood and affect.  Nursing note and vitals reviewed.   ED Course  Procedures (including critical care time) Labs Review Labs Reviewed  BASIC METABOLIC PANEL - Abnormal; Notable for the following:    Glucose, Bld 101 (*)    Creatinine, Ser 4.37 (*)    Calcium 8.7 (*)    GFR calc non Af Amer 8 (*)    GFR calc Af Amer 10 (*)    All other components within normal limits  CBC WITH DIFFERENTIAL/PLATELET - Abnormal; Notable for the  following:    RBC 3.76 (*)    Hemoglobin 11.0 (*)    HCT 34.7 (*)    RDW 19.0 (*)    Platelets 61 (*)    Neutro Abs 9.2 (*)    All other components within normal limits  PROTIME-INR - Abnormal; Notable for the following:    Prothrombin Time 21.9 (*)  INR 1.93 (*)    All other components within normal limits    Imaging Review Dg Ribs Unilateral W/chest Left  11/01/2015  CLINICAL DATA:  Fall on left side, injury.  Left posterior rib pain. EXAM: LEFT RIBS AND CHEST - 3+ VIEW COMPARISON:  Chest x-rays dated 07/30/2015 and 12/19/2014. FINDINGS: Mild cardiomegaly is stable. Overall cardiomediastinal silhouette is stable in size and configuration. Mild chronic interstitial prominence is stable. Lungs otherwise clear. No evidence of pneumonia. No pleural effusion or pneumothorax seen. There are slightly displaced fractures of the anterior-lateral left ninth and tenth ribs. Questionable additional slightly displaced fracture of the left lateral fourth rib. Osseous structures are diffusely osteopenic. IMPRESSION: 1. Slightly displaced fractures of the anterior-lateral left ninth and tenth ribs. Questionable additional slightly displaced fracture of the left lateral fourth rib. 2. Osteopenia. 3. Stable cardiomegaly. 4. Lungs appear clear.  No pleural effusion or pneumothorax seen. Electronically Signed   By: Bary RichardStan  Maynard M.D.   On: 11/01/2015 10:08   Medications given in the ED Medications  0.9 %  sodium chloride infusion (not administered)  fentaNYL (SUBLIMAZE) injection 50 mcg (50 mcg Nasal Given 11/01/15 0739)  fentaNYL (SUBLIMAZE) injection 50 mcg (50 mcg Nasal Given 11/01/15 0931)  Note: Patient was given intranasal fentanyl because we were unable to establish IV access.   I have personally reviewed and evaluated these images and lab results as part of my medical decision-making.   MDM   Final diagnoses:  Rib fracture, left, closed, initial encounter  End stage renal failure on dialysis  (HCC)    Pt continues to have pain.  Rib fx noted on CXR.  Pain improved with intranasal fentanyl.  However she is still requiring repeat dosing and is unable to move without significant pain.  Patient also has history of chronic renal failure. She did not go to dialysis today. She will ultimately needing her dialysis.  I will consult with the medical service for admission, pain management. She'll need allergy consultation.   Linwood DibblesJon Genita Nilsson, MD 11/01/15 1023

## 2015-11-01 NOTE — ED Notes (Signed)
Pt was at home getting ready for dialysis and tripped and hit her left side on her night stand. Pt denies hitting head. Pt is not on blood thinners.

## 2015-11-01 NOTE — Progress Notes (Signed)
Patient trasfered from ED to 5W25 via stretcher; alert and oriented x 4; complaints of pain on left side of chest; no IV access; MD notified; skin intact. Orient patient to room and unit; gave patient care guide; instructed how to use the call bell and  fall risk precautions; daughter at bedside. Will continue to monitor the patient.

## 2015-11-01 NOTE — ED Notes (Signed)
Notified xray again that patient is ready for imaging.

## 2015-11-01 NOTE — ED Notes (Signed)
This RN unsuccessful with lab draw and IV start. MD made aware of x3 unsuccessful attempts. IV team consult placed. Verbal order to give [facility]ranasal [facility]anyl [facility].

## 2015-11-01 NOTE — ED Notes (Signed)
IV team unsuccessful with IV access at this time. Xray notified that patient may transport for scans.

## 2015-11-01 NOTE — Progress Notes (Addendum)
Hemodialysis- Pt tolerated procedure. Unable to UF d/t frequent bp drops into 60s-70s systolic. Pt remained asymptomatic throughout. UF=0. Post HD bp 96/46. Pt currently has no complaints. Repositioned in bed for comfort. Report called to 5W.

## 2015-11-01 NOTE — H&P (Signed)
Date: 11/01/2015               Patient Name:  [name] MRN: [id number]  DOB: 11/14/1927 Age / Sex: 82 y.o., female   PCP: No primary care provider on file.         Medical Service: Internal Medicine Teaching Service         Attending Physician: Dr. Burns SpainElizabeth A Butcher, MD    First Contact: Dr. Mikey BussingHoffman Pager: [phone]  Second Contact: Dr. Beckie Saltsivet Pager: [phone]       After Hours (After 5p/  First Contact Pager: [phone]  weekends / holidays): Second Contact Pager: [phone]   Chief Complaint: Rib Pain   History of Present Illness: [name] is an 82 yo old female with a PMHx of AF, CHF, ESRD and Gout who presented to the ED for a fall.  She tried to stand up from her bed after wakeing up in the morning but states she did not have enough strength and fell.  When she fell she hit the left side of her body against her night stand.  She denies loosing consciousness and did not feel dizzy or lightheaded prior to the episode.  She reports pain to the lateral left chest wall that is exacerbated with movement and pain to her back.  She has a history of falls in the past.  Her grandaughter was at the bedside and stated that the patient has fallen several times in past year more often in the bathtub.  Patient lives alone at home and has home health visit her daily for a couple of hours to help with her daily activities. She does not want to live with relatives despite their offer because she does not want to leave her home.  She states she is able to wash herself off in the morning and dress herself but has assistance in the evening with bathing.  Patient refuses to use a walker or a cane for support.  Patient notes leg swelling and denies SOB, chest pain, dizziness or abdominal pain.  Meds: Current Facility-Administered Medications  Medication Dose Route Frequency Provider Last Rate Last Dose  . acetaminophen (TYLENOL) tablet 650 mg  650 mg Oral Q6H PRN Su Hoffarly J Rivet, MD       Or  .  acetaminophen (TYLENOL) suppository 650 mg  650 mg Rectal Q6H PRN Carly J Rivet, MD      . allopurinol (ZYLOPRIM) tablet 100 mg  100 mg Oral QHS Carly J Rivet, MD      . aspirin EC tablet 81 mg  81 mg Oral Daily Carly J Rivet, MD      . cinacalcet (SENSIPAR) tablet 30 mg  30 mg Oral Q supper Carly J Rivet, MD      . clopidogrel (PLAVIX) tablet 75 mg  75 mg Oral Daily Carly J Rivet, MD      . heparin injection 5,000 Units  5,000 Units Subcutaneous Q8H Carly J Rivet, MD      . loratadine (CLARITIN) tablet 10 mg  10 mg Oral QHS Carly J Rivet, MD      . midodrine (PROAMATINE) tablet 10 mg  10 mg Oral QHS Carly J Rivet, MD      . midodrine (PROAMATINE) tablet 10 mg  10 mg Oral Daily PRN Burns SpainElizabeth A Butcher, MD      . multivitamin (RENA-VIT) tablet 1 tablet  1 tablet Oral QHS Carly Arlyce HarmanJ Rivet, MD      . oxyCODONE-acetaminophen (  PERCOCET/ROXICET) 5-325 MG per tablet 1-2 tablet  1-2 tablet Oral Q4H PRN Carly J Rivet, MD      . pantoprazole (PROTONIX) EC tablet 40 mg  40 mg Oral QHS Carly J Rivet, MD      . pyridOXINE (VITAMIN B-6) tablet 100 mg  100 mg Oral QHS Carly J Rivet, MD      . sevelamer carbonate (RENVELA) tablet 800 mg  800 mg Oral Q supper Carly J Rivet, MD      . thiamine (VITAMIN B-1) tablet 100 mg  100 mg Oral QHS Su Hoff, MD        Allergies: Allergies as of 11/01/2015 - Review Complete 11/01/2015  Allergen Reaction Noted  . Sulfa antibiotics Anaphylaxis 07/24/2010  . Aminophylline Itching 08/01/2012  . Other Itching 11/01/2015  . Clarithromycin Itching and Rash 07/24/2010  . Ezetimibe-simvastatin Rash 11/01/2015  . Penicillins Rash 07/24/2010  . Propoxyphene Itching 11/01/2015  . Rofecoxib Rash 11/01/2015   Past Medical History  Diagnosis Date  . Chest pain 07/07/10  . Hypertension   . Edema 07/24/10    of ankles  . Generalized headaches 03/12  . Lightheadedness 07/24/10  . Reflux   . Fatigue   . Arthritis   . Gout   . Hyperglycemia     Borderline diabetes  .  Peptic ulcer disease   . AF (paroxysmal atrial fibrillation) (HCC)     Used to be on Warfarin in the past but was taken off due to falls, memory problems.  . CHF (congestive heart failure) (HCC)     30 years ago  . GERD (gastroesophageal reflux disease)   . Diabetes mellitus without complication (HCC)     borderline    . End stage renal disease on dialysis [facility]) since 2010    related to Vioxx, HD T-TH-SAT    Family History:  Family History  Problem Relation Age of Onset  . Cancer Father 41    throat cancer  . Other Mother     mother died from burns  . Kidney disease Sister   . CAD Neg Hx   . Diabetes Sister   . Diabetes Brother      Social History:  Social History   Social History  . Marital Status: Widowed    Spouse Name: N/A  . Number of Children: N/A  . Years of Education: N/A   Occupational History  . Not on file.   Social History Main Topics  . Smoking status: Former Smoker -- 2 years    Types: Cigarettes    Quit date: 04/28/1979  . Smokeless tobacco: Never Used     Comment: rare - 1-2x/week, quit 30 yrs ago  . Alcohol Use: No  . Drug Use: No  . Sexual Activity: Not on file   Other Topics Concern  . Not on file   Social History Narrative   Lives at home by herself, has home health services         Review of Systems: A complete ROS was negative except as per HPI. Marland Kitchen  Physical Exam: Blood pressure 96/47, pulse 66, temperature 97.8 F (36.6 C), temperature source Oral, resp. rate 18, height  (1.651 m), weight 146 lb 11.2 oz (66.543 kg), SpO2 87 %. Physical Exam  Constitutional: She is oriented to person, place, and time.  HENT:  Head: Normocephalic and atraumatic.  Eyes: Pupils are equal, round, and reactive to light.  Neck: No JVD present.  Cardiovascular:  Irregularly Irregular  Pulmonary/Chest:  Effort normal. No respiratory distress.  Abdominal: Soft. She exhibits no distension.  Musculoskeletal: She exhibits edema.  Lower leg  swelling 2+ pitting edema   Neurological: She is alert and oriented to person, place, and time.  Motor Strength 3/5 upper extremities 2/5 lower extremities   Skin: Skin is warm.     EKG: none  CXR:  IMPRESSION: 1. Slightly displaced fractures of the anterior-lateral left ninth and tenth ribs. Questionable additional slightly displaced fracture of the left lateral fourth rib. 2. Osteopenia. 3. Stable cardiomegaly. 4. Lungs appear clear. No pleural effusion or pneumothorax seen.  Assessment & Plan by Problem: Rib fracture Chest x ray shows a fracture to the forth, ninth and tenth ribs.  Recent trauma from fall this morning explains these findings.  Will manage patients pain at this time.  Fracture from a fall at a height of standing position suggests osteoporosis.  Will give patient Vitamin D.  Will discuss with patient about starting a bisphosphonate.   - Percocet Q4PRN - Vitamin D 1000 once daily  ESRD Patient has dialysis [facility]  Nephrology was consulted - Dialysis scheduled for today - Continuing home meds for ESRD  DVT prophylaxis  -Heparin SubQ  Gout No recent flares -Continue Allopurinol home med   Dispo: Admit patient to Observation with expected length of stay less than 2 midnights.  Signed: Camelia PhenesJessica Ratliff Devera Englander, DO 11/01/2015, 1:19 PM  Pager: [phone]

## 2015-11-02 DIAGNOSIS — R7989 Other specified abnormal findings of blood chemistry: Secondary | ICD-10-CM | POA: Diagnosis not present

## 2015-11-02 DIAGNOSIS — R531 Weakness: Secondary | ICD-10-CM | POA: Diagnosis not present

## 2015-11-02 DIAGNOSIS — R269 Unspecified abnormalities of gait and mobility: Secondary | ICD-10-CM

## 2015-11-02 DIAGNOSIS — R55 Syncope and collapse: Secondary | ICD-10-CM | POA: Diagnosis not present

## 2015-11-02 DIAGNOSIS — N186 End stage renal disease: Secondary | ICD-10-CM | POA: Diagnosis not present

## 2015-11-02 DIAGNOSIS — I132 Hypertensive heart and chronic kidney disease with heart failure and with stage 5 chronic kidney disease, or end stage renal disease: Secondary | ICD-10-CM | POA: Diagnosis not present

## 2015-11-02 DIAGNOSIS — N390 Urinary tract infection, site not specified: Secondary | ICD-10-CM | POA: Diagnosis not present

## 2015-11-02 DIAGNOSIS — E8889 Other specified metabolic disorders: Secondary | ICD-10-CM | POA: Diagnosis not present

## 2015-11-02 DIAGNOSIS — E876 Hypokalemia: Secondary | ICD-10-CM | POA: Diagnosis not present

## 2015-11-02 DIAGNOSIS — M109 Gout, unspecified: Secondary | ICD-10-CM | POA: Diagnosis not present

## 2015-11-02 DIAGNOSIS — R748 Abnormal levels of other serum enzymes: Secondary | ICD-10-CM | POA: Diagnosis not present

## 2015-11-02 DIAGNOSIS — Z992 Dependence on renal dialysis: Secondary | ICD-10-CM | POA: Diagnosis not present

## 2015-11-02 DIAGNOSIS — Y92003 Bedroom of unspecified non-institutional (private) residence as the place of occurrence of the external cause: Secondary | ICD-10-CM

## 2015-11-02 DIAGNOSIS — E872 Acidosis: Secondary | ICD-10-CM | POA: Diagnosis not present

## 2015-11-02 DIAGNOSIS — I959 Hypotension, unspecified: Secondary | ICD-10-CM

## 2015-11-02 DIAGNOSIS — W1839XA Other fall on same level, initial encounter: Secondary | ICD-10-CM

## 2015-11-02 DIAGNOSIS — E43 Unspecified severe protein-calorie malnutrition: Secondary | ICD-10-CM

## 2015-11-02 DIAGNOSIS — Y9389 Activity, other specified: Secondary | ICD-10-CM

## 2015-11-02 DIAGNOSIS — H9190 Unspecified hearing loss, unspecified ear: Secondary | ICD-10-CM | POA: Diagnosis not present

## 2015-11-02 DIAGNOSIS — I5032 Chronic diastolic (congestive) heart failure: Secondary | ICD-10-CM | POA: Diagnosis not present

## 2015-11-02 DIAGNOSIS — Z833 Family history of diabetes mellitus: Secondary | ICD-10-CM | POA: Diagnosis not present

## 2015-11-02 DIAGNOSIS — J189 Pneumonia, unspecified organism: Secondary | ICD-10-CM | POA: Diagnosis not present

## 2015-11-02 DIAGNOSIS — S2242XA Multiple fractures of ribs, left side, initial encounter for closed fracture: Secondary | ICD-10-CM

## 2015-11-02 DIAGNOSIS — R410 Disorientation, unspecified: Secondary | ICD-10-CM

## 2015-11-02 DIAGNOSIS — N2581 Secondary hyperparathyroidism of renal origin: Secondary | ICD-10-CM | POA: Diagnosis not present

## 2015-11-02 DIAGNOSIS — K219 Gastro-esophageal reflux disease without esophagitis: Secondary | ICD-10-CM | POA: Diagnosis not present

## 2015-11-02 DIAGNOSIS — I480 Paroxysmal atrial fibrillation: Secondary | ICD-10-CM | POA: Diagnosis not present

## 2015-11-02 DIAGNOSIS — I9589 Other hypotension: Secondary | ICD-10-CM | POA: Diagnosis not present

## 2015-11-02 DIAGNOSIS — E782 Mixed hyperlipidemia: Secondary | ICD-10-CM | POA: Diagnosis not present

## 2015-11-02 DIAGNOSIS — I951 Orthostatic hypotension: Secondary | ICD-10-CM

## 2015-11-02 DIAGNOSIS — R2681 Unsteadiness on feet: Secondary | ICD-10-CM

## 2015-11-02 DIAGNOSIS — S2232XD Fracture of one rib, left side, subsequent encounter for fracture with routine healing: Secondary | ICD-10-CM | POA: Diagnosis not present

## 2015-11-02 DIAGNOSIS — Z87891 Personal history of nicotine dependence: Secondary | ICD-10-CM | POA: Diagnosis not present

## 2015-11-02 DIAGNOSIS — D696 Thrombocytopenia, unspecified: Secondary | ICD-10-CM | POA: Diagnosis not present

## 2015-11-02 DIAGNOSIS — Z841 Family history of disorders of kidney and ureter: Secondary | ICD-10-CM | POA: Diagnosis not present

## 2015-11-02 DIAGNOSIS — R296 Repeated falls: Secondary | ICD-10-CM | POA: Diagnosis not present

## 2015-11-02 DIAGNOSIS — D649 Anemia, unspecified: Secondary | ICD-10-CM | POA: Diagnosis not present

## 2015-11-02 DIAGNOSIS — E1122 Type 2 diabetes mellitus with diabetic chronic kidney disease: Secondary | ICD-10-CM | POA: Diagnosis not present

## 2015-11-02 LAB — RENAL FUNCTION PANEL
ANION GAP: 5 (ref 5–15)
Albumin: 1.5 g/dL — ABNORMAL LOW (ref 3.5–5.0)
BUN: <5 mg/dL — ABNORMAL LOW (ref 6–20)
CHLORIDE: 102 mmol/L (ref 101–111)
CO2: 31 mmol/L (ref 22–32)
CREATININE: 2.72 mg/dL — AB (ref 0.44–1.00)
Calcium: 7.8 mg/dL — ABNORMAL LOW (ref 8.9–10.3)
GFR, EST AFRICAN AMERICAN: 17 mL/min — AB (ref >60)
GFR, EST NON AFRICAN AMERICAN: 15 mL/min — AB (ref >60)
Glucose, Bld: 78 mg/dL (ref 65–99)
Phosphorus: 2.3 mg/dL — ABNORMAL LOW (ref 2.5–4.6)
Potassium: 4.2 mmol/L (ref 3.5–5.1)
Sodium: 138 mmol/L (ref 135–145)

## 2015-11-02 LAB — CBC
HEMATOCRIT: 33.1 % — AB (ref 36.0–46.0)
HEMOGLOBIN: 10.7 g/dL — AB (ref 12.0–15.0)
MCH: 29.3 pg (ref 26.0–34.0)
MCHC: 32.3 g/dL (ref 30.0–36.0)
MCV: 90.7 fL (ref 78.0–100.0)
PLATELETS: 39 10*3/uL — AB (ref 150–400)
RBC: 3.65 MIL/uL — AB (ref 3.87–5.11)
RDW: 19 % — ABNORMAL HIGH (ref 11.5–15.5)
WBC: 6 10*3/uL (ref 4.0–10.5)

## 2015-11-02 MED ORDER — DOCUSATE SODIUM 100 MG PO CAPS
100.0000 mg | ORAL_CAPSULE | Freq: Every day | ORAL | Status: DC
  Administered 2015-11-02 – 2015-11-03 (×2): 100 mg via ORAL
  Filled 2015-11-02: qty 1

## 2015-11-02 MED ORDER — MIDODRINE HCL 5 MG PO TABS
10.0000 mg | ORAL_TABLET | Freq: Every day | ORAL | Status: DC | PRN
  Administered 2015-11-02: 10 mg via ORAL
  Filled 2015-11-02: qty 2

## 2015-11-02 MED ORDER — OXYCODONE-ACETAMINOPHEN 5-325 MG PO TABS
1.0000 | ORAL_TABLET | ORAL | Status: DC | PRN
  Administered 2015-11-02 – 2015-11-03 (×2): 1 via ORAL
  Filled 2015-11-02 (×2): qty 1

## 2015-11-02 MED ORDER — LIDOCAINE-EPINEPHRINE-TETRACAINE (LET) TOPICAL GEL
3.0000 mL | Freq: Three times a day (TID) | TOPICAL | Status: DC
  Filled 2015-11-02: qty 3

## 2015-11-02 MED ORDER — ACETAMINOPHEN 500 MG PO TABS
1000.0000 mg | ORAL_TABLET | Freq: Three times a day (TID) | ORAL | Status: DC
  Administered 2015-11-02 – 2015-11-03 (×3): 1000 mg via ORAL
  Filled 2015-11-02 (×4): qty 2

## 2015-11-02 MED ORDER — ACETAMINOPHEN 80 MG PO CHEW: 1000.0000 mg | CHEWABLE_TABLET | Freq: Three times a day (TID) | ORAL | Status: DC

## 2015-11-02 MED ORDER — LIDOCAINE-EPINEPHRINE-TETRACAINE (LET) SOLUTION
3.0000 mL | Freq: Three times a day (TID) | NASAL | Status: DC
  Administered 2015-11-02 – 2015-11-03 (×4): 3 mL via TOPICAL
  Filled 2015-11-02 (×8): qty 3

## 2015-11-02 MED ORDER — MIDODRINE HCL 5 MG PO TABS
10.0000 mg | ORAL_TABLET | Freq: Every day | ORAL | Status: DC
  Administered 2015-11-02: 10 mg via ORAL
  Filled 2015-11-02: qty 2

## 2015-11-02 MED ORDER — NEPRO/CARBSTEADY PO LIQD: 237.0000 mL | Freq: Three times a day (TID) | ORAL | Status: DC

## 2015-11-02 NOTE — Progress Notes (Signed)
Paged MD re:  Pts B/P.  No orders given.  The Patient has no IV access. Grandaughter states she "lives" in the 70-80s" She is not complaining of dizziness or extra weakness.  She is slightly confused. Granddaughter says this is not abnormal when she takes percocet.

## 2015-11-02 NOTE — Care Management Obs Status (Signed)
MEDICARE OBSERVATION STATUS NOTIFICATION   Patient Details  Name: Dorian PodLillian P Bostwick MRN: [id number] Date of Birth: 10/17/1927   Medicare Observation Status Notification Given:    MOON given and signed by pt; gave pt copy; gave original to CMA to be scanned into chart.  Yves DillJeffries, Keerthi Hazell Christine, RN 11/02/2015, 2:46 PM

## 2015-11-02 NOTE — Progress Notes (Signed)
Cm received message that daughter had questions about obs letter. CM went to bedside and spoke with daughter and explained obs letter to her and addressed all questions. Understanding verbalized.

## 2015-11-02 NOTE — Progress Notes (Addendum)
PT Cancellation Note  Patient Details Name: Dorian PodLillian P Gaskins MRN: [id number] DOB: 02/28/1928   Cancelled Treatment:    Reason Eval/Treat Not Completed: Pain limiting ability to participate.  Attempted to see patient 2x today.  Busy with nursing care on first attempt, 2nd attempt patient refused due to pain. Will re-attempt tomorrow.   Freida BusmanAllen, Allyce Bochicchio L 11/02/2015, 5:48 PM

## 2015-11-02 NOTE — Progress Notes (Signed)
   Subjective: [name] was evaluated on rounds this morning.  OT was persent at the bedside.  Patient was A&O x3 but during the interview she was unable to answer questions and appeared confused.  She could not remember that she had dialysis yesterday and said she had a hospital appointment today.  She complained of left sided back pain that becomes worse with movement.  She rated it at a 7 but when she is given her medication it becomes a 1.  The pain is managed for a couple of hours.  She denies SOB, chest pain, or dizziness.  Objective: Vital signs in last 24 hours: Filed Vitals:   11/01/15 2115 11/01/15 2201 11/02/15 0242 11/02/15 0633  BP: 96/46 85/47 82/60  76/51  Pulse: 90 112 90 86  Temp: 97.6 F (36.4 C) 97.8 F (36.6 C)  97.8 F (36.6 C)  TempSrc: Oral     Resp: 15 18  20   Height:      Weight: 126 lb 8.7 oz (57.4 kg)     SpO2: 95% 95%  91%   Physical Exam Physical Exam  Constitutional: She is oriented to person, place, and time.  She appeared uncomfortable laying slightly more on her right side  HENT:  Head: Normocephalic and atraumatic.  Cardiovascular:  Irregularly Irregular   Pulmonary/Chest: Effort normal and breath sounds normal. No respiratory distress.  Abdominal: Soft. Bowel sounds are normal. She exhibits no distension. There is no tenderness.  Musculoskeletal: She exhibits edema.  Tenderness to lateral left sided chest wall  1+ pitting edema bilaterally to lower extremities   Neurological: She is alert and oriented to person, place, and time.  Skin: Skin is warm and dry.     Assessment/Plan: Rib fracture Due to patient's mild confusion Percocet was decreased from 2 pills Q4PRN to 1 pill Q4PRN.   Due to patients ESRD will discontinue Vitamin D.  It may be difficult to assess if patient's fracture was from osteoporosis or due to her ESRD and therefore a bisphosphonate will not be started.  - Percocet Q4PRN 1 pill - Tylenol 100mg  TID - Lidocaine gel  TID - Incentive Spirometry ordered  ESRD Patient has dialysis [facility] Nephrology was consulted - Continuing home meds for ESRD  DVT prophylaxis  Patients platelets are 39  -Discontinue Heparin SubQ  Thrombocytopenia Patients platelets dropped from 61 to 39 - Discontinue protonix    Dispo: Anticipated discharge in approximately 2 day(s).     Camelia PhenesJessica Gonzales Mersades Barbaro, DO 11/02/2015, 10:45 AM Pager: [phone]

## 2015-11-02 NOTE — Progress Notes (Signed)
Occupational Therapy Evaluation Patient Details Name: [name] MRN: [id number] DOB: 03/09/1928 Today's Date: 11/02/2015    History of Present Illness 82 y.o. female admitted for L rib fx after falling out of bed and hitting L side on nightstand. PMH significant for HTN, CHF, a-fib, ESRD, GERD, hyperglycemia (borderline diabetes), gout, arthritis, HA, and bilateral LE edema.   Clinical Impression   PTA, pt required assistance with dressing, bathing, and homemaking tasks and did not use AD for mobility. Pt currently presents with confusion, acute L back and leg pain, generalized weakness, and balance deficits. Pt required mod assist for bed mobility and min assist for ADLs and basic transfers. Pt will benefit from continued acute OT to increase independence and safety with ADLs and mobility to allow for safe discharge to the venue listed below. Feel pt would benefit from short-term SNF stay prior to returning home since pt has hx of falls and requires increased physical assistance at this time. Will continue to follow acutely.    Follow Up Recommendations  SNF;Supervision/Assistance - 24 hour    Equipment Recommendations  Other (comment) (TBD at next venue)    Recommendations for Other Services       Precautions / Restrictions Precautions Precautions: Fall Restrictions Weight Bearing Restrictions: No      Mobility Bed Mobility Overal bed mobility: Needs Assistance Bed Mobility: Supine to Sit     Supine to sit: Mod assist     General bed mobility comments: Assist to pivot and scoot hips to EOB with bed pad and for trunk support to come to sitting position. VCs for initiation and for safe hand placement.  Transfers Overall transfer level: Needs assistance Equipment used: Rolling walker (2 wheeled) Transfers: Sit to/from UGI CorporationStand;Stand Pivot Transfers Sit to Stand: Min assist Stand pivot transfers: Min assist       General transfer comment: Assist for boost to  stand and to stabilize balance upon standing. VCs for safe hand placement and assist to manage RW properly.    Balance Overall balance assessment: Needs assistance;History of Falls Sitting-balance support: No upper extremity supported;Feet supported Sitting balance-Leahy Scale: Fair Sitting balance - Comments: limitations due to L back pain Postural control: Posterior lean Standing balance support: Bilateral upper extremity supported;During functional activity Standing balance-Leahy Scale: Poor Standing balance comment: reliant on BUE support to maintain standing balance                            ADL Overall ADL's : Needs assistance/impaired Eating/Feeding: Minimal assistance;Sitting Eating/Feeding Details (indicate cue type and reason): Assist to open containers and set up plate/utensils withi neasy reach due to increased LUE pain             Upper Body Dressing : Minimal assistance;Sitting   Lower Body Dressing: Maximal assistance;Sit to/from stand   Toilet Transfer: Minimal assistance;Cueing for safety;Stand-pivot;BSC;RW   Toileting- Clothing Manipulation and Hygiene: Minimal assistance;Sit to/from stand       Functional mobility during ADLs: Minimal assistance;Rolling walker       Vision     Perception     Praxis      Pertinent Vitals/Pain Pain Assessment: 0-10 Pain Score: 7  Pain Location: L side of back and LLE Pain Descriptors / Indicators: Aching Pain Intervention(s): Limited activity within patient's tolerance;Monitored during session;Repositioned;Patient requesting pain meds-RN notified     Hand Dominance Right   Extremity/Trunk Assessment Upper Extremity Assessment Upper Extremity Assessment: Generalized weakness (L>R due to pain)  Lower Extremity Assessment Lower Extremity Assessment: Generalized weakness (L>R due to pain)   Cervical / Trunk Assessment Cervical / Trunk Assessment: Kyphotic   Communication  Communication Communication: No difficulties   Cognition Arousal/Alertness: Awake/alert Behavior During Therapy: WFL for tasks assessed/performed Overall Cognitive Status: Impaired/Different from baseline Area of Impairment: Memory;Following commands;Safety/judgement;Problem solving     Memory: Decreased short-term memory Following Commands: Follows one step commands with increased time Safety/Judgement: Decreased awareness of safety;Decreased awareness of deficits   Problem Solving: Slow processing;Decreased initiation;Difficulty sequencing;Requires verbal cues;Requires tactile cues General Comments: Pt oriented x4, but is mildly confused. Pt unable to answer some of therapist's questions and would respond off topic.    General Comments       Exercises       Shoulder Instructions      Home Living Family/patient expects to be discharged to:: Private residence Living Arrangements: Alone Available Help at Discharge: Family;Available PRN/intermittently;Personal care attendant Type of Home: House Home Access: Stairs to enter Entrance Stairs-Number of Steps: Unsure - pt unable to accurately recall         Bathroom Shower/Tub: Tub/shower unit         Home Equipment: Shower seat   Additional Comments: Pt is mildly confused and a poor historian at this time. Only able obtain minimal home and PLOF information      Prior Functioning/Environment Level of Independence: Needs assistance  Gait / Transfers Assistance Needed: None, but reports she thinks she needs to start using a RW because she can barely walk. ADL's / Homemaking Assistance Needed: PCA comes 5days/week, mornings on non-dialysis days, evenings on dialysis days (x3 days/week). PCA assist with bathing, dressing and homemaking tasks.         OT Diagnosis: Generalized weakness;Cognitive deficits;Acute pain   OT Problem List: Decreased strength;Decreased range of motion;Decreased activity tolerance;Impaired balance  (sitting and/or standing);Decreased safety awareness;Decreased knowledge of use of DME or AE;Decreased knowledge of precautions;Pain   OT Treatment/Interventions: Self-care/ADL training;Therapeutic exercise;DME and/or AE instruction;Energy conservation;Therapeutic activities;Patient/family education;Balance training    OT Goals(Current goals can be found in the care plan section) Acute Rehab OT Goals Patient Stated Goal: to have less pain OT Goal Formulation: With patient Time For Goal Achievement: 11/16/15 Potential to Achieve Goals: Good ADL Goals Pt Will Perform Grooming: with supervision;standing Pt Will Perform Upper Body Bathing: with supervision;sitting Pt Will Perform Lower Body Bathing: with supervision;sit to/from stand Pt Will Transfer to Toilet: with supervision;ambulating;bedside commode (over toilet) Pt Will Perform Toileting - Clothing Manipulation and hygiene: with supervision;sitting/lateral leans;sit to/from stand  OT Frequency: Min 2X/week   Barriers to D/C: Decreased caregiver support  Lives alone and PCA comes for 3 hrs 5days/week       Co-evaluation              End of Session Equipment Utilized During Treatment: Gait belt;Rolling walker Nurse Communication: Mobility status  Activity Tolerance: Patient limited by pain Patient left: in chair;with call bell/phone within reach;with chair alarm set   Time: [id number] OT Time Calculation (min): 33 min Charges:  OT General Charges $OT Visit: 1 Procedure OT Evaluation $OT Eval Moderate Complexity: 1 Procedure OT Treatments $Self Care/Home Management : 8-22 mins G-Codes: OT G-codes **NOT FOR INPATIENT CLASS** Functional Assessment Tool Used: clinical judgement Functional Limitation: Self care Self Care Current Status (V4098): At least 20 percent but less than 40 percent impaired, limited or restricted Self Care Goal Status (J1914): At least 1 percent but less than 20 percent impaired, limited or restricted  Nils Pyle, OTR/L Pager: [phone] 11/02/2015, 10:40 AM

## 2015-11-02 NOTE — Progress Notes (Signed)
Patient admitted under Observation status for fall with rib fracture for pain control.  Had dialysis last night here.  If patient status changes to Inpatient status please notify us and we will do a formal consultation.   Vinson Moselleob Gabriella Woodhead MD [facility] pager [phone]    cell [phone] 11/02/2015, 11:45 AM

## 2015-11-02 NOTE — Progress Notes (Addendum)
  Date: 11/02/2015  Patient name: Kaitlyn PodLillian P Gowens  Medical record number: [id number]  Date of birth: 11/24/1927   I have seen and evaluated [name] and discussed their care with the Residency Team. [name] is an [age] female with ESRD who presented to ED after a fall. She woke up and stood and lost strength and fell against her bedside table, hitting her L side. In the ED, she was found to have L 9th, 10th, and possibly the 4th rib fractures.   She lives alone and has been adamant to family that she is not moving from her home. She has an aide for a couple of hours a day. She ambulates without a cane or a walker. Family states she has had several falls, most freq in the tub.   This AM, her BP was as low as 76/51. The nurse left a note that the granddaughter states this is nl for her and she is on sch and PRN midodrine. She has confused this AM but the granddaughter states this happens after she takes percocet.  PMHx, Fam Hx, and/or Soc Hx : PMHx also sig for AF, CHF, and gout. No cardiac dz in fam hx. Soc hx per HPI  Filed Vitals:   11/02/15 0242 11/02/15 0633  BP: 82/60 76/51  Pulse: 90 86  Temp:  97.8 F (36.6 C)  Resp:  20  T max 97.8 RR 20 Gen, thin, appears in pain Neuro alert and oriented x 3 but some responses are non sensical H tachy, irr irr ABD : + BS, soft Ext + 1 edema LE  Assessment and Plan: I have seen and evaluated the patient as outlined above. I agree with the formulated Assessment and Plan as detailed in the residents' note, with the following changes:   1. Acute L rib fx from fall - it wil be challenging to tease out whether she has osteoporosis or bone dz from her ESRD. Even if we were to be able to dx osteoporosis, bisphosphonates are CI 2/2 ESRD and her life expectancy is not long enough to justify other meds. Her phosphorus is controlled on a phosphate binder. Her calcium corrects to about 10. Would not add Vit D or calcium without checking with  renal. She needs pain control and PT/OT. Incentive spirometry to prevent ATX.  2. Thrombocytopenia - this is new from 2014. Her plts were low PRIOR to heparin for DVT prophylaxis being started or getting inpt HD. Would need to get outpt CBC results to see if new or old. She is having no overt bleeding.   3. Acute delirium - she has many reasons to have this - pain, opioids, out of home environment. Supportive care - open blinds, re-orientation, pain control.   4. Hypotension - this is likely chronic as she has midodrine on med list. Would like to verify this with renal. If chronic, no further W/U needed.  5. Gait abnl - she has been adamant to live alone in her home. Currently she is in not conditio not do that. OT is rec SNF. If she makes progress and returns to her pre-hospitalization state, then PACE might be an option as she has medicaid and would also have medicare since on HD.  6. Severe protein caloria malnutrition - her weight decreased from 165 in [id number] to 126 today. Her alb is 1.5. Supplemental nutrition as tolerated.  Burns SpainElizabeth A Taaliyah Delpriore, MD 7/8/201710:47 AM

## 2015-11-03 DIAGNOSIS — Y92003 Bedroom of unspecified non-institutional (private) residence as the place of occurrence of the external cause: Secondary | ICD-10-CM | POA: Diagnosis not present

## 2015-11-03 DIAGNOSIS — D696 Thrombocytopenia, unspecified: Secondary | ICD-10-CM | POA: Diagnosis not present

## 2015-11-03 DIAGNOSIS — N186 End stage renal disease: Secondary | ICD-10-CM | POA: Diagnosis not present

## 2015-11-03 DIAGNOSIS — W1839XA Other fall on same level, initial encounter: Secondary | ICD-10-CM | POA: Diagnosis not present

## 2015-11-03 DIAGNOSIS — S2242XA Multiple fractures of ribs, left side, initial encounter for closed fracture: Secondary | ICD-10-CM | POA: Diagnosis not present

## 2015-11-03 LAB — CBC
HCT: 34.1 % — ABNORMAL LOW (ref 36.0–46.0)
Hemoglobin: 11.3 g/dL — ABNORMAL LOW (ref 12.0–15.0)
MCH: 29.3 pg (ref 26.0–34.0)
MCHC: 33.1 g/dL (ref 30.0–36.0)
MCV: 88.3 fL (ref 78.0–100.0)
PLATELETS: 54 10*3/uL — AB (ref 150–400)
RBC: 3.86 MIL/uL — ABNORMAL LOW (ref 3.87–5.11)
RDW: 19.3 % — ABNORMAL HIGH (ref 11.5–15.5)
WBC: 7.1 10*3/uL (ref 4.0–10.5)

## 2015-11-03 MED ORDER — ACETAMINOPHEN 500 MG PO TABS: 1000.0000 mg | ORAL_TABLET | Freq: Three times a day (TID) | ORAL | Status: AC

## 2015-11-03 MED ORDER — LIDOCAINE 2 % EX GEL: 1.0000 "application " | Freq: Four times a day (QID) | CUTANEOUS | Status: DC | PRN

## 2015-11-03 MED ORDER — NEPRO/CARBSTEADY PO LIQD: 237.0000 mL | Freq: Three times a day (TID) | ORAL | Status: AC

## 2015-11-03 MED ORDER — ONDANSETRON HCL 4 MG PO TABS: 4.0000 mg | ORAL_TABLET | Freq: Once | ORAL | Status: DC

## 2015-11-03 MED ORDER — WHITE PETROLATUM GEL
Status: AC
  Administered 2015-11-03: 11:00:00
  Filled 2015-11-03: qty 1

## 2015-11-03 NOTE — Progress Notes (Signed)
Paged provider rivett to inform of pt vomiting. Pt to get po nausea medicine. Will continue to monitor pt.

## 2015-11-03 NOTE — Evaluation (Signed)
Physical Therapy Evaluation Patient Details Name: [name] MRN: [id number] DOB: [birth year] Today's Date: 11/03/2015   History of Present Illness  82 y.o. female admitted for L rib fx after falling out of bed and hitting L side on nightstand. PMH significant for HTN, CHF, a-fib, ESRD, GERD, hyperglycemia (borderline diabetes), gout, arthritis, HA, and bilateral LE edema.  Clinical Impression  Pt admitted with above diagnosis. Pt currently with functional limitations due to the deficits listed below (see PT Problem List). Pt requiring max A for bed mobility, mod A for transfers, and min A for ambulation very short distances with RW. She is currently limited by pain and nausea. Would greatly benefit from working with PT again tomorrow before home. Recommending HHPT at d/c. Pt will benefit from skilled PT to increase their independence and safety with mobility to allow discharge to the venue listed below.      Follow Up Recommendations Home health PT;Supervision/Assistance - 24 hour    Equipment Recommendations  None recommended by PT    Recommendations for Other Services OT consult     Precautions / Restrictions Precautions Precautions: Fall Restrictions Weight Bearing Restrictions: No      Mobility  Bed Mobility Overal bed mobility: Needs Assistance Bed Mobility: Sit to Supine       Sit to supine: Max assist;+2 for physical assistance   General bed mobility comments: max A at upper body and LE's for return to bed and scoot up. Pt not able to scoot laterally in bed either  Transfers Overall transfer level: Needs assistance Equipment used: Rolling walker (2 wheeled) Transfers: Sit to/from Stand Sit to Stand: Mod assist         General transfer comment: pt reports that she is hurting more today, increased time to get to edge of chair and prepare to stand, encouragement given, mod A to stand from recliner  Ambulation/Gait Ambulation/Gait assistance: Min  assist Ambulation Distance (Feet): 10 Feet Assistive device: Rolling walker (2 wheeled) Gait Pattern/deviations: Trendelenburg;Decreased stride length;Step-through pattern;Trunk flexed Gait velocity: decreased Gait velocity interpretation: Below normal speed for age/gender General Gait Details: pt with slow, labored gait, vc's for staying within RW  Stairs            Wheelchair Mobility    Modified Rankin (Stroke Patients Only)       Balance Overall balance assessment: Needs assistance;History of Falls Sitting-balance support: No upper extremity supported Sitting balance-Leahy Scale: Fair     Standing balance support: Bilateral upper extremity supported Standing balance-Leahy Scale: Poor                               Pertinent Vitals/Pain Pain Assessment: Faces Faces Pain Scale: Hurts whole lot Pain Location: left rib cage and back, pt also nauseous Pain Descriptors / Indicators: Aching Pain Intervention(s): Limited activity within patient's tolerance;Monitored during session;Repositioned    Home Living Family/patient expects to be discharged to:: Private residence Living Arrangements: Alone Available Help at Discharge: Family;Available PRN/intermittently;Personal care attendant Type of Home: House Home Access: Stairs to enter Entrance Stairs-Rails: Right Entrance Stairs-Number of Steps: 3   Home Equipment: Emergency planning/management officer - 2 wheels;Cane - single point Additional Comments: pt's granddaughter reports that pt can go home with her daughter and will have 24/7 care. PTA, she lived alone but had a caregiver come in daily.     Prior Function Level of Independence: Needs assistance   Gait / Transfers Assistance Needed: None, but reports  she thinks she needs to start using a RW because she can barely walk.  ADL's / Homemaking Assistance Needed: PCA comes 5days/week, mornings on non-dialysis days, evenings on dialysis days (x3 days/week). PCA assist with  bathing, dressing and homemaking tasks.         Hand Dominance   Dominant Hand: Right    Extremity/Trunk Assessment   Upper Extremity Assessment: Generalized weakness           Lower Extremity Assessment: Generalized weakness      Cervical / Trunk Assessment: Kyphotic;Other exceptions  Communication   Communication: No difficulties  Cognition Arousal/Alertness: Awake/alert Behavior During Therapy: WFL for tasks assessed/performed Overall Cognitive Status: Impaired/Different from baseline Area of Impairment: Memory;Following commands;Safety/judgement;Problem solving     Memory: Decreased short-term memory Following Commands: Follows one step commands with increased time Safety/Judgement: Decreased awareness of safety;Decreased awareness of deficits   Problem Solving: Slow processing;Decreased initiation;Difficulty sequencing;Requires verbal cues;Requires tactile cues General Comments: pt mildly confused, this may be her baseline    General Comments      Exercises        Assessment/Plan    PT Assessment Patient needs continued PT services  PT Diagnosis Difficulty walking;Abnormality of gait;Generalized weakness;Acute pain   PT Problem List Decreased strength;Decreased activity tolerance;Decreased balance;Decreased mobility;Decreased cognition;Decreased safety awareness;Decreased knowledge of precautions;Pain  PT Treatment Interventions DME instruction;Gait training;Stair training;Functional mobility training;Therapeutic activities;Therapeutic exercise;Balance training;Patient/family education   PT Goals (Current goals can be found in the Care Plan section) Acute Rehab PT Goals Patient Stated Goal: to have less pain PT Goal Formulation: With patient Time For Goal Achievement: 11/17/15 Potential to Achieve Goals: Fair    Frequency Min 3X/week   Barriers to discharge        Co-evaluation               End of Session Equipment Utilized During  Treatment: Gait belt Activity Tolerance: Patient limited by pain;Other (comment) (limited by nausea) Patient left: in bed;with bed alarm set;with family/visitor present;with call bell/phone within reach Nurse Communication: Mobility status    Functional Assessment Tool Used: clinical judgement Functional Limitation: Mobility: Walking and moving around Mobility: Walking and Moving Around Current Status ([id number](G8978): At least 40 percent but less than 60 percent impaired, limited or restricted Mobility: Walking and Moving Around Goal Status [phone]([id number]): At least 1 percent but less than 20 percent impaired, limited or restricted    Time: 1120-1138 PT Time Calculation (min) (ACUTE ONLY): 18 min   Charges:   PT Evaluation $PT Eval Moderate Complexity: 1 Procedure     PT G Codes:   PT G-Codes **NOT FOR INPATIENT CLASS** Functional Assessment Tool Used: clinical judgement Functional Limitation: Mobility: Walking and moving around Mobility: Walking and Moving Around Current Status (W2956(G8978): At least 40 percent but less than 60 percent impaired, limited or restricted Mobility: Walking and Moving Around Goal Status [phone](G8979): At least 1 percent but less than 20 percent impaired, limited or restricted  Lyanne CoVictoria Mang [place], PT  Acute Rehab Services  [phone]   Lyanne CoManess, Veretta Sabourin 11/03/2015, 12:17 PM

## 2015-11-03 NOTE — Care Management Note (Signed)
Case Management Note  Patient Details  Name: [name] MRN: [id number] Date of Birth: 01-22-1928  Subjective/Objective:                  L rib fx  Action/Plan: Discharge planning Expected Discharge Date:  11/03/15               Expected Discharge Plan:  [place]  In-House Referral:     Discharge planning Services  CM Consult  Post Acute Care Choice:    Choice offered to:  Patient, Adult Children  DME Arranged:    DME Agency:     HH Arranged:  PT, OT HH Agency:  [facility]  Status of Service:  Completed, signed off  If discussed at [place] of Stay Meetings, dates discussed:    Additional Comments: CM met with pt and pt's granddaughter in room to offer choice of home health agency.  Family chooses [facility] to render HHPT/OT.  Referral called to [facility] rep, Tiffany.  Family states no additional DME needed.  No other Cm needs were communicated. Dellie Catholic, RN 11/03/2015, 12:20 PM

## 2015-11-03 NOTE — Progress Notes (Signed)
Dr Beckie Saltsivet notified that pt's daughter is requesting to speak with a physician or case management regarding pt's care and safety after discharge. Dr Beckie Saltsivet to come to pt's bedside.

## 2015-11-03 NOTE — Progress Notes (Signed)
CM was called by MD and by RN to please come to room and speak with pt and pt's daughter to go over disposition again.  CM has explained the MOON letter given to pt yesterday.  CM explained to daughter, pt has not been declared incompetent and regardless of pt's daughter being POA, pt is not incapacitated and is able to choose her home health agency. Family has decided the pt will go to granddaughter's [place] home [address] [place] [address] and can be reached at [phone]. [facility] was arranged per family request.  [facility] called.  CM No other CM needs were relayed [facility].

## 2015-11-03 NOTE — Progress Notes (Signed)
   Subjective: No acute events overnight. Patient reports her pain is better controlled with the scheduled Tylenol and lidocaine gel. Her BPs are still low in the 80s. She is more alert today.   Objective: Vital signs in last 24 hours: Filed Vitals:   11/02/15 0633 11/02/15 1520 11/02/15 2146 11/03/15 0503  BP: 76/51 87/57 86/50  83/45  Pulse: 86 60 77 74  Temp: 97.8 F (36.6 C) 97.7 F (36.5 C)  97.9 F (36.6 C)  TempSrc:    Oral  Resp: 20 16 20 15   Height:      Weight:      SpO2: 91% 93% 94% 97%   Physical Exam General: elderly woman resting in bed, NAD HEENT: [place]/AT, EOMI, sclera anicteric, mucus membranes moist CV: RRR, no m/g/r Pulm: CTA bilaterally Ext: warm, no edema Neuro: alert and oriented x 3  Assessment/Plan:  Left Rib Fractures: Reports her pain is better controlled with the scheduled tylenol and lidocaine gel. Patient not interested in SNF, wants to stay at home. I am concerned about this as she is a fall risk. Family also concerned and making effort to visit her at home daily.  - Will not prescribe Percocet on discharge as this made her confused - Continue Tylenol 1000 mg TID - Continue Lidocaine gel TID - Continue incentive spirometry   ESRD: Patient has dialysis M W F. Last HD 7/7.  - Continuing home meds for ESRD  Thrombocytopenia: Patients platelets dropped from 61 to 39 yesterday. Heparin and Protonix were discontinued. Awaiting CBC from today. I asked her dialysis center to fax me records of prior blood work so will see if this is a chronic issue or not.  Diet: Renal with fluid restriction DVT PPx: SCDs Dispo: Discharge today    Su Hoffarly J Rivet, MD 11/03/2015, 7:30 AM Pager: [phone]

## 2015-11-03 NOTE — Discharge Summary (Signed)
Name: [name]: [id number] DOB: 12/27/1927 82 y.o. PCP: No primary care provider on file.  Date of Admission: 11/01/2015  6:36 AM Date of Discharge: 11/04/2015 Attending Physician: No att. providers found  Discharge Diagnosis: 1. Left rib fracture of 9th and 10th rib 2. ESRD on dialysis   Discharge Medications:   Medication List    STOP taking these medications        acetaminophen-codeine 300-30 MG tablet  Commonly known as:  TYLENOL #3     colchicine 0.6 MG tablet     oxyCODONE-acetaminophen 5-325 MG tablet  Commonly known as:  ROXICET     pantoprazole 40 MG tablet  Commonly known as:  PROTONIX      TAKE these medications        acetaminophen 500 MG tablet  Commonly known as:  TYLENOL  Take 2 tablets (1,000 mg total) by mouth every 8 (eight) hours.     allopurinol 100 MG tablet  Commonly known as:  ZYLOPRIM  Take 100 mg by mouth at bedtime.     aspirin EC 81 MG tablet  Take 81 mg by mouth daily.     cinacalcet 30 MG tablet  Commonly known as:  SENSIPAR  Take 30 mg by mouth at bedtime.     clopidogrel 75 MG tablet  Commonly known as:  PLAVIX  Take 75 mg by mouth daily.     feeding supplement (NEPRO CARB STEADY) Liqd  Take 237 mLs by mouth 3 (three) times daily with meals.     Lidocaine 2 % Gel  Apply 1 application topically 4 (four) times daily as needed.     loratadine 10 MG tablet  Commonly known as:  CLARITIN  Take 10 mg by mouth at bedtime.     midodrine 10 MG tablet  Commonly known as:  PROAMATINE  Take 10 mg by mouth See admin instructions. Take 1 tablet (10 mg) by mouth daily at bedtime, may take an additional tablet prior to dialysis as needed for low blood pressure     multivitamin Tabs tablet  Take 1 tablet by mouth at bedtime.     pyridOXINE 100 MG tablet  Commonly known as:  VITAMIN B-6  Take 100 mg by mouth at bedtime.     sevelamer carbonate 800 MG tablet  Commonly known as:  RENVELA  Take 800 mg by mouth at  bedtime.     thiamine 100 MG tablet  Commonly known as:  VITAMIN B-1  Take 100 mg by mouth at bedtime.        Disposition and follow-up:   [name] [facility]  At the hospital follow up visit please address:  1.  Hypotension - likely chronic as she is on midodrine.  If chronic no further work up needed.  Reassess ADLs, prior to admission patient lived alone and did not use a cane or walker.    2.  Labs / imaging needed at time of follow-up: CBC - had thrombocytopenia on admission, check to see if this is a new or old issue.  3.  Pending labs/ test needing follow-up: none  Follow-up Appointments: Follow-up Information    Follow up with [facility].   Why:  home health physical and occupational therapy   Contact information:   [address] [place] [address] [phone]       Hospital Course by problem list:   1. Left Rib  fracture to 9th and 10th rib [name] is an 82 yo woman with a PMHx of AF, CHF, ESRD and gout.  She was admitted for rib fracture following a fall from getting out of bed and hitting her night stand.   She attributes the fall to loss of strength.  She has lower leg swelling from her ESRD also making it difficult to walk.  X ray showed slightly displaced fractures of the anterior-lateral left ninth and tenth ribs. Questionable additional slightly displaced fracture of the left lateral fourth rib. Patient has a history of falls and is followed by home health a couple of hours a day. Her pain was managed with Percocet Q4PRN 1 tablet, Tylenol  TID And Lidocaine gel TID.   During admission patient became acutely confused and this may have been attributed to pain, opiods and out of home environment.  Opioids were decreased during admission.   2. ESRD Patient has dialysis MWF.  Hemodyalisis was done in hospital on Friday.  3.  Thrombocytopenia - new from  2014 and platelets were low prior to heparin for DVT prophylaxis.  CBC outpatient to determine if this new or old.  Patient did not have any overt bleeding during stay.    4.  Hypotension BP was as low as 76/51 - this may be attributed to her scheduled and PRN midodrine.     Discharge Vitals:   BP 90/51 mmHg  Pulse 77  Temp(Src) 97.4 F (36.3 C) (Oral)  Resp 16  Ht  (1.651 m)  Wt 126 lb 8.7 oz (57.4 kg)  BMI 21.06 kg/m2  SpO2 95%  Pertinent Labs, Studies, and Procedures:  Left Ribs and Chest X-ray  Discharge Instructions: Discharge Instructions    Diet - low sodium heart healthy    Complete by:  As directed      Discharge instructions    Complete by:  As directed   It was a pleasure taking care of you, [name]. You were hospitalized after getting left sided rib fractures after a fall.  Here are the important things to remember: - Take Tylenol 1000 mg three times daily for pain. I would continue this higher dosing for only 2 weeks, then cut back. Your pain should be better by that point. - Apply Lidocaine gel four times daily to the left side where your pain is located. - Please follow up with your primary care doctor in the next 1 week - Please contact [facility] to be enrolled in their program. In the meantime, your home aide will continue to help you and I have recommended for home health physical therapy to come to your house as well   Take care, Dr. Beckie Salts     Increase activity slowly    Complete by:  As directed            Signed: Camelia Phenes, DO 11/04/2015, 5:40 PM

## 2015-11-04 LAB — VITAMIN D 25 HYDROXY (VIT D DEFICIENCY, FRACTURES): Vit D, 25-Hydroxy: 8.8 ng/mL — ABNORMAL LOW (ref 30.0–100.0)

## 2015-11-05 ENCOUNTER — Encounter (HOSPITAL_COMMUNITY): Payer: Self-pay | Admitting: Neurology

## 2015-11-05 ENCOUNTER — Telehealth: Payer: Self-pay | Admitting: Internal Medicine

## 2015-11-05 ENCOUNTER — Inpatient Hospital Stay (HOSPITAL_COMMUNITY)
Admission: EM | Admit: 2015-11-05 | Discharge: 2015-11-10 | DRG: 689 | Disposition: A | Discharge status: Skilled Nursing Facility | Payer: Medicare Other | Attending: Internal Medicine | Admitting: Internal Medicine

## 2015-11-05 ENCOUNTER — Emergency Department (HOSPITAL_COMMUNITY): Payer: Medicare Other

## 2015-11-05 DIAGNOSIS — S2232XD Fracture of one rib, left side, subsequent encounter for fracture with routine healing: Secondary | ICD-10-CM

## 2015-11-05 DIAGNOSIS — Z7902 Long term (current) use of antithrombotics/antiplatelets: Secondary | ICD-10-CM

## 2015-11-05 DIAGNOSIS — M109 Gout, unspecified: Secondary | ICD-10-CM | POA: Diagnosis present

## 2015-11-05 DIAGNOSIS — R778 Other specified abnormalities of plasma proteins: Secondary | ICD-10-CM

## 2015-11-05 DIAGNOSIS — D696 Thrombocytopenia, unspecified: Secondary | ICD-10-CM | POA: Diagnosis present

## 2015-11-05 DIAGNOSIS — E872 Acidosis, unspecified: Secondary | ICD-10-CM | POA: Diagnosis present

## 2015-11-05 DIAGNOSIS — N186 End stage renal disease: Secondary | ICD-10-CM | POA: Diagnosis not present

## 2015-11-05 DIAGNOSIS — I132 Hypertensive heart and chronic kidney disease with heart failure and with stage 5 chronic kidney disease, or end stage renal disease: Secondary | ICD-10-CM | POA: Diagnosis present

## 2015-11-05 DIAGNOSIS — Z833 Family history of diabetes mellitus: Secondary | ICD-10-CM

## 2015-11-05 DIAGNOSIS — W010XXD Fall on same level from slipping, tripping and stumbling without subsequent striking against object, subsequent encounter: Secondary | ICD-10-CM | POA: Diagnosis present

## 2015-11-05 DIAGNOSIS — I9589 Other hypotension: Secondary | ICD-10-CM | POA: Diagnosis present

## 2015-11-05 DIAGNOSIS — N390 Urinary tract infection, site not specified: Secondary | ICD-10-CM | POA: Diagnosis not present

## 2015-11-05 DIAGNOSIS — E876 Hypokalemia: Secondary | ICD-10-CM | POA: Diagnosis not present

## 2015-11-05 DIAGNOSIS — Z7401 Bed confinement status: Secondary | ICD-10-CM

## 2015-11-05 DIAGNOSIS — K219 Gastro-esophageal reflux disease without esophagitis: Secondary | ICD-10-CM | POA: Diagnosis present

## 2015-11-05 DIAGNOSIS — R7401 Elevation of levels of liver transaminase levels: Secondary | ICD-10-CM

## 2015-11-05 DIAGNOSIS — D649 Anemia, unspecified: Secondary | ICD-10-CM | POA: Diagnosis present

## 2015-11-05 DIAGNOSIS — N2581 Secondary hyperparathyroidism of renal origin: Secondary | ICD-10-CM | POA: Diagnosis not present

## 2015-11-05 DIAGNOSIS — E782 Mixed hyperlipidemia: Secondary | ICD-10-CM | POA: Diagnosis present

## 2015-11-05 DIAGNOSIS — R740 Nonspecific elevation of levels of transaminase and lactic acid dehydrogenase [LDH]: Secondary | ICD-10-CM

## 2015-11-05 DIAGNOSIS — R7989 Other specified abnormal findings of blood chemistry: Secondary | ICD-10-CM

## 2015-11-05 DIAGNOSIS — J189 Pneumonia, unspecified organism: Secondary | ICD-10-CM | POA: Diagnosis not present

## 2015-11-05 DIAGNOSIS — B9689 Other specified bacterial agents as the cause of diseases classified elsewhere: Secondary | ICD-10-CM | POA: Diagnosis not present

## 2015-11-05 DIAGNOSIS — Z880 Allergy status to penicillin: Secondary | ICD-10-CM

## 2015-11-05 DIAGNOSIS — S2239XA Fracture of one rib, unspecified side, initial encounter for closed fracture: Secondary | ICD-10-CM | POA: Diagnosis not present

## 2015-11-05 DIAGNOSIS — R531 Weakness: Secondary | ICD-10-CM

## 2015-11-05 DIAGNOSIS — Z79899 Other long term (current) drug therapy: Secondary | ICD-10-CM

## 2015-11-05 DIAGNOSIS — N3000 Acute cystitis without hematuria: Secondary | ICD-10-CM | POA: Diagnosis not present

## 2015-11-05 DIAGNOSIS — Z881 Allergy status to other antibiotic agents status: Secondary | ICD-10-CM

## 2015-11-05 DIAGNOSIS — S2242XD Multiple fractures of ribs, left side, subsequent encounter for fracture with routine healing: Secondary | ICD-10-CM | POA: Diagnosis not present

## 2015-11-05 DIAGNOSIS — I950 Idiopathic hypotension: Secondary | ICD-10-CM | POA: Diagnosis not present

## 2015-11-05 DIAGNOSIS — R55 Syncope and collapse: Secondary | ICD-10-CM | POA: Diagnosis not present

## 2015-11-05 DIAGNOSIS — R296 Repeated falls: Secondary | ICD-10-CM | POA: Diagnosis present

## 2015-11-05 DIAGNOSIS — M6281 Muscle weakness (generalized): Secondary | ICD-10-CM | POA: Diagnosis not present

## 2015-11-05 DIAGNOSIS — H9190 Unspecified hearing loss, unspecified ear: Secondary | ICD-10-CM | POA: Diagnosis present

## 2015-11-05 DIAGNOSIS — R627 Adult failure to thrive: Secondary | ICD-10-CM | POA: Diagnosis present

## 2015-11-05 DIAGNOSIS — I5032 Chronic diastolic (congestive) heart failure: Secondary | ICD-10-CM | POA: Diagnosis present

## 2015-11-05 DIAGNOSIS — I319 Disease of pericardium, unspecified: Secondary | ICD-10-CM | POA: Diagnosis not present

## 2015-11-05 DIAGNOSIS — L8990 Pressure ulcer of unspecified site, unspecified stage: Secondary | ICD-10-CM | POA: Insufficient documentation

## 2015-11-05 DIAGNOSIS — E1122 Type 2 diabetes mellitus with diabetic chronic kidney disease: Secondary | ICD-10-CM | POA: Diagnosis present

## 2015-11-05 DIAGNOSIS — R488 Other symbolic dysfunctions: Secondary | ICD-10-CM | POA: Diagnosis not present

## 2015-11-05 DIAGNOSIS — Z7189 Other specified counseling: Secondary | ICD-10-CM | POA: Insufficient documentation

## 2015-11-05 DIAGNOSIS — Z992 Dependence on renal dialysis: Secondary | ICD-10-CM

## 2015-11-05 DIAGNOSIS — Z9181 History of falling: Secondary | ICD-10-CM | POA: Diagnosis not present

## 2015-11-05 DIAGNOSIS — I959 Hypotension, unspecified: Secondary | ICD-10-CM | POA: Diagnosis present

## 2015-11-05 DIAGNOSIS — E8889 Other specified metabolic disorders: Secondary | ICD-10-CM | POA: Diagnosis present

## 2015-11-05 DIAGNOSIS — Z87891 Personal history of nicotine dependence: Secondary | ICD-10-CM | POA: Diagnosis not present

## 2015-11-05 DIAGNOSIS — I10 Essential (primary) hypertension: Secondary | ICD-10-CM | POA: Diagnosis not present

## 2015-11-05 DIAGNOSIS — R748 Abnormal levels of other serum enzymes: Secondary | ICD-10-CM | POA: Diagnosis present

## 2015-11-05 DIAGNOSIS — R4182 Altered mental status, unspecified: Secondary | ICD-10-CM | POA: Diagnosis not present

## 2015-11-05 DIAGNOSIS — Z515 Encounter for palliative care: Secondary | ICD-10-CM | POA: Insufficient documentation

## 2015-11-05 DIAGNOSIS — D631 Anemia in chronic kidney disease: Secondary | ICD-10-CM | POA: Diagnosis not present

## 2015-11-05 DIAGNOSIS — I480 Paroxysmal atrial fibrillation: Secondary | ICD-10-CM | POA: Diagnosis present

## 2015-11-05 DIAGNOSIS — Z888 Allergy status to other drugs, medicaments and biological substances status: Secondary | ICD-10-CM

## 2015-11-05 DIAGNOSIS — R1311 Dysphagia, oral phase: Secondary | ICD-10-CM | POA: Diagnosis not present

## 2015-11-05 DIAGNOSIS — Z841 Family history of disorders of kidney and ureter: Secondary | ICD-10-CM

## 2015-11-05 DIAGNOSIS — S2242XA Multiple fractures of ribs, left side, initial encounter for closed fracture: Secondary | ICD-10-CM | POA: Diagnosis not present

## 2015-11-05 DIAGNOSIS — R2681 Unsteadiness on feet: Secondary | ICD-10-CM | POA: Diagnosis not present

## 2015-11-05 DIAGNOSIS — R031 Nonspecific low blood-pressure reading: Secondary | ICD-10-CM | POA: Diagnosis not present

## 2015-11-05 DIAGNOSIS — Z7982 Long term (current) use of aspirin: Secondary | ICD-10-CM

## 2015-11-05 DIAGNOSIS — S2232XA Fracture of one rib, left side, initial encounter for closed fracture: Secondary | ICD-10-CM | POA: Diagnosis present

## 2015-11-05 LAB — URINALYSIS, ROUTINE W REFLEX MICROSCOPIC
GLUCOSE, UA: NEGATIVE mg/dL
Ketones, ur: 15 mg/dL — AB
Nitrite: NEGATIVE
PROTEIN: 30 mg/dL — AB
SPECIFIC GRAVITY, URINE: 1.021 (ref 1.005–1.030)
pH: 5.5 (ref 5.0–8.0)

## 2015-11-05 LAB — I-STAT CG4 LACTIC ACID, ED
LACTIC ACID, VENOUS: 4.63 mmol/L — AB (ref 0.5–1.9)
LACTIC ACID, VENOUS: 5.46 mmol/L — AB (ref 0.5–1.9)
Lactic Acid, Venous: 3.97 mmol/L (ref 0.5–1.9)

## 2015-11-05 LAB — CBC WITH DIFFERENTIAL/PLATELET
Basophils Absolute: 0 10*3/uL (ref 0.0–0.1)
Basophils Relative: 0 %
EOS PCT: 0 %
Eosinophils Absolute: 0 10*3/uL (ref 0.0–0.7)
HEMATOCRIT: 33.7 % — AB (ref 36.0–46.0)
Hemoglobin: 11 g/dL — ABNORMAL LOW (ref 12.0–15.0)
LYMPHS ABS: 0.5 10*3/uL — AB (ref 0.7–4.0)
LYMPHS PCT: 7 %
MCH: 29.1 pg (ref 26.0–34.0)
MCHC: 32.6 g/dL (ref 30.0–36.0)
MCV: 89.2 fL (ref 78.0–100.0)
Monocytes Absolute: 0.4 10*3/uL (ref 0.1–1.0)
Monocytes Relative: 6 %
NEUTROS ABS: 5.9 10*3/uL (ref 1.7–7.7)
Neutrophils Relative %: 86 %
PLATELETS: 66 10*3/uL — AB (ref 150–400)
RBC: 3.78 MIL/uL — AB (ref 3.87–5.11)
RDW: 19.6 % — ABNORMAL HIGH (ref 11.5–15.5)
WBC: 6.8 10*3/uL (ref 4.0–10.5)

## 2015-11-05 LAB — COMPREHENSIVE METABOLIC PANEL
ALK PHOS: 156 U/L — AB (ref 38–126)
ALT: 24 U/L (ref 14–54)
AST: 41 U/L (ref 15–41)
Albumin: 1.6 g/dL — ABNORMAL LOW (ref 3.5–5.0)
Anion gap: 10 (ref 5–15)
BILIRUBIN TOTAL: 1.4 mg/dL — AB (ref 0.3–1.2)
BUN: 18 mg/dL (ref 6–20)
CALCIUM: 8.2 mg/dL — AB (ref 8.9–10.3)
CO2: 26 mmol/L (ref 22–32)
CREATININE: 6.31 mg/dL — AB (ref 0.44–1.00)
Chloride: 98 mmol/L — ABNORMAL LOW (ref 101–111)
GFR, EST AFRICAN AMERICAN: 6 mL/min — AB (ref >60)
GFR, EST NON AFRICAN AMERICAN: 5 mL/min — AB (ref >60)
Glucose, Bld: 98 mg/dL (ref 65–99)
Potassium: 5.1 mmol/L (ref 3.5–5.1)
Sodium: 134 mmol/L — ABNORMAL LOW (ref 135–145)
TOTAL PROTEIN: 4.5 g/dL — AB (ref 6.5–8.1)

## 2015-11-05 LAB — I-STAT TROPONIN, ED: Troponin i, poc: 0.2 ng/mL (ref 0.00–0.08)

## 2015-11-05 LAB — MRSA PCR SCREENING: MRSA by PCR: NEGATIVE

## 2015-11-05 LAB — LACTIC ACID, PLASMA
Lactic Acid, Venous: 4.8 mmol/L (ref 0.5–1.9)
Lactic Acid, Venous: 4.4 mmol/L (ref 0.5–1.9)

## 2015-11-05 LAB — URINE MICROSCOPIC-ADD ON

## 2015-11-05 LAB — TROPONIN I
TROPONIN I: 0.35 ng/mL — AB (ref <0.03)
TROPONIN I: 0.42 ng/mL — AB (ref <0.03)

## 2015-11-05 MED ORDER — DEXTROSE 5 % IV SOLN
2.0000 g | Freq: Once | INTRAVENOUS | Status: AC
  Administered 2015-11-05: 2 g via INTRAVENOUS
  Filled 2015-11-05: qty 2

## 2015-11-05 MED ORDER — SODIUM CHLORIDE 0.9 % IV BOLUS (SEPSIS)
500.0000 mL | Freq: Once | INTRAVENOUS | Status: AC
  Administered 2015-11-05: 500 mL via INTRAVENOUS

## 2015-11-05 MED ORDER — LIDOCAINE-PRILOCAINE 2.5-2.5 % EX CREA
1.0000 "application " | TOPICAL_CREAM | CUTANEOUS | Status: DC | PRN
  Filled 2015-11-05: qty 5

## 2015-11-05 MED ORDER — WHITE PETROLATUM GEL
Status: AC
  Filled 2015-11-05: qty 1

## 2015-11-05 MED ORDER — MIDODRINE HCL 5 MG PO TABS
10.0000 mg | ORAL_TABLET | ORAL | Status: DC | PRN
  Administered 2015-11-09: 10 mg via ORAL

## 2015-11-05 MED ORDER — ALBUMIN HUMAN 25 % IV SOLN
25.0000 g | Freq: Once | INTRAVENOUS | Status: AC
  Administered 2015-11-05: 25 g via INTRAVENOUS

## 2015-11-05 MED ORDER — ASPIRIN EC 81 MG PO TBEC
81.0000 mg | DELAYED_RELEASE_TABLET | Freq: Every day | ORAL | Status: DC
  Administered 2015-11-05 – 2015-11-10 (×6): 81 mg via ORAL
  Filled 2015-11-05 (×6): qty 1

## 2015-11-05 MED ORDER — MIDODRINE HCL 5 MG PO TABS: 10.0000 mg | ORAL_TABLET | Freq: Once | ORAL | Status: DC

## 2015-11-05 MED ORDER — PENTAFLUOROPROP-TETRAFLUOROETH EX AERO: 1.0000 "application " | INHALATION_SPRAY | CUTANEOUS | Status: DC | PRN

## 2015-11-05 MED ORDER — LIDOCAINE HCL (PF) 1 % IJ SOLN: 5.0000 mL | INTRAMUSCULAR | Status: DC | PRN

## 2015-11-05 MED ORDER — HEPARIN SODIUM (PORCINE) 1000 UNIT/ML DIALYSIS: 1000.0000 [IU] | INTRAMUSCULAR | Status: DC | PRN

## 2015-11-05 MED ORDER — SEVELAMER CARBONATE 800 MG PO TABS
800.0000 mg | ORAL_TABLET | Freq: Every day | ORAL | Status: DC
  Administered 2015-11-05 – 2015-11-08 (×4): 800 mg via ORAL
  Filled 2015-11-05 (×4): qty 1

## 2015-11-05 MED ORDER — MIDODRINE HCL 5 MG PO TABS
10.0000 mg | ORAL_TABLET | Freq: Every day | ORAL | Status: DC
  Administered 2015-11-06 – 2015-11-07 (×2): 10 mg via ORAL
  Filled 2015-11-05 (×3): qty 2

## 2015-11-05 MED ORDER — VANCOMYCIN HCL 500 MG IV SOLR: 500.0000 mg | INTRAVENOUS | Status: DC

## 2015-11-05 MED ORDER — GLUCERNA SHAKE PO LIQD
237.0000 mL | Freq: Every day | ORAL | Status: DC
  Administered 2015-11-06 – 2015-11-08 (×2): 237 mL via ORAL

## 2015-11-05 MED ORDER — VANCOMYCIN HCL IN DEXTROSE 1-5 GM/200ML-% IV SOLN
1000.0000 mg | Freq: Once | INTRAVENOUS | Status: AC
  Administered 2015-11-05: 1000 mg via INTRAVENOUS
  Filled 2015-11-05: qty 200

## 2015-11-05 MED ORDER — CLOPIDOGREL BISULFATE 75 MG PO TABS: 75.0000 mg | ORAL_TABLET | Freq: Every day | ORAL | Status: DC

## 2015-11-05 MED ORDER — LORATADINE 10 MG PO TABS
10.0000 mg | ORAL_TABLET | Freq: Every day | ORAL | Status: DC
  Administered 2015-11-05 – 2015-11-09 (×5): 10 mg via ORAL
  Filled 2015-11-05 (×5): qty 1

## 2015-11-05 MED ORDER — ACETAMINOPHEN 500 MG PO TABS
1000.0000 mg | ORAL_TABLET | Freq: Three times a day (TID) | ORAL | Status: DC
  Administered 2015-11-05 – 2015-11-10 (×14): 1000 mg via ORAL
  Filled 2015-11-05 (×14): qty 2

## 2015-11-05 MED ORDER — SODIUM CHLORIDE 0.9 % IV BOLUS (SEPSIS)
250.0000 mL | Freq: Once | INTRAVENOUS | Status: AC
  Administered 2015-11-05: 250 mL via INTRAVENOUS

## 2015-11-05 MED ORDER — ALTEPLASE 2 MG IJ SOLR: 2.0000 mg | Freq: Once | INTRAMUSCULAR | Status: DC | PRN

## 2015-11-05 MED ORDER — ALBUMIN HUMAN 25 % IV SOLN
INTRAVENOUS | Status: AC
  Administered 2015-11-05: 25 g via INTRAVENOUS
  Filled 2015-11-05: qty 100

## 2015-11-05 MED ORDER — ALLOPURINOL 100 MG PO TABS
100.0000 mg | ORAL_TABLET | Freq: Every day | ORAL | Status: DC
  Administered 2015-11-05 – 2015-11-09 (×5): 100 mg via ORAL
  Filled 2015-11-05 (×5): qty 1

## 2015-11-05 MED ORDER — SODIUM CHLORIDE 0.9 % IV SOLN: 100.0000 mL | INTRAVENOUS | Status: DC | PRN

## 2015-11-05 MED ORDER — HEPARIN SODIUM (PORCINE) 5000 UNIT/ML IJ SOLN
5000.0000 [IU] | Freq: Three times a day (TID) | INTRAMUSCULAR | Status: DC
  Administered 2015-11-05 – 2015-11-10 (×15): 5000 [IU] via SUBCUTANEOUS
  Filled 2015-11-05 (×13): qty 1

## 2015-11-05 MED ORDER — DEXTROSE 5 % IV SOLN
1.0000 g | Freq: Once | INTRAVENOUS | Status: DC
  Filled 2015-11-05: qty 1

## 2015-11-05 MED ORDER — CINACALCET HCL 30 MG PO TABS
30.0000 mg | ORAL_TABLET | Freq: Every day | ORAL | Status: DC
  Administered 2015-11-05 – 2015-11-09 (×5): 30 mg via ORAL
  Filled 2015-11-05 (×5): qty 1

## 2015-11-05 MED ORDER — HEPARIN SODIUM (PORCINE) 1000 UNIT/ML DIALYSIS: 20.0000 [IU]/kg | INTRAMUSCULAR | Status: DC | PRN

## 2015-11-05 MED ORDER — ASPIRIN 81 MG PO CHEW
324.0000 mg | CHEWABLE_TABLET | Freq: Once | ORAL | Status: AC
  Administered 2015-11-05: 324 mg via ORAL
  Filled 2015-11-05: qty 4

## 2015-11-05 NOTE — Consult Note (Signed)
[facility] Renal Consultation Note    Indication for Consultation:  Management of ESRD/hemodialysis; anemia, hypertension/volume and secondary hyperparathyroidism PCP:  HPI: [name] is a 82 y.o. female with ESRD on MWF dialysis in [place].  She last dialyzed last week during an OBSERVATION admission at [facility] for left  fractured ribs.  BP was low at that time. CXR was clear so EDW was raised from 62 to 64 because she had not been attaining this EDW at her usual HD center.  Patient was discharged with home health services.She has done poorly since dischargesleeping most of the time and was unable to come to dialysis Monday.  She was able to walk around her home before this event but has been too lethargic or in pain to do anything.  She had been living alone but family is in and out all day and prepare her meals. She did arrive at her outpatient unit today but it took 5 individuals to get her in the car at home.   She was only oriented to self, lethargic and unable to get out of the car due to rib pain.  EMS was called for transfer to [facility].  Her BP at her dialysis center was only 70s palp.  Her family is requesting SNF placement as they don't think they can care for her at this time.  Pt is now more alert than when she first arrived at the hospital.   She has not expressed any symptoms of CP.  Evaluation in the ED showed an initial BP of 83/53.  500 IVF were given sats are ok on O2 hgb stable at 11 K 5.1 platelets 66 K up from 39 K 3 days ago but lower than last outpatient level of 101 K 07/2015 WBC is 6.8 with 86% neutrophils glucose is wnl CXR shows mild pul edema ? Left lung and right upper lobe infiltrate vs alveolar edema. Initial troponin is 0.35 Cardiology is consulted to evaluate this and EKG changes. She has left sided pain but can't vocalize any other complaints. Her family notes a blister on her left heal. She still makes urine and wears depends. The family also notes  worsening LE edema.  Past Medical History  Diagnosis Date  . Chest pain 07/07/10  . Hypertension   . Edema 07/24/10    of ankles  . Generalized headaches 03/12  . Lightheadedness 07/24/10  . Reflux   . Fatigue   . Arthritis   . Gout   . Hyperglycemia     Borderline diabetes  . Peptic ulcer disease   . AF (paroxysmal atrial fibrillation) (HCC)     Used to be on Warfarin in the past but was taken off due to falls, memory problems.  . CHF (congestive heart failure) (HCC)     30 years ago  . GERD (gastroesophageal reflux disease)   . Diabetes mellitus without complication (HCC)     borderline    . End stage renal disease on dialysis [facility](HCC) since 2010    related to Vioxx, HD T-TH-SAT   Past Surgical History  Procedure Laterality Date  . Hemodialysis catheter      & Placement left forearm AV graft   . Arteriovenous graft placement    . Abdominal hysterectomy    . Cholecystectomy    . Eye surgery      bilateral cataracts  . Revision of arteriovenous goretex graft Left 01/06/2013    Procedure: REVISION OF ARTERIOVENOUS GORETEX GRAFT;  Surgeon: Di Kindlehristopher S  Edilia Bo, MD;  Location: [facility] OR;  Service: Vascular;  Laterality: Left;   Family History  Problem Relation Age of Onset  . Cancer Father 38    throat cancer  . Other Mother     mother died from burns  . Kidney disease Sister   . CAD Neg Hx   . Diabetes Sister   . Diabetes Brother    Social History:  reports that she quit smoking about 36 years ago. Her smoking use included Cigarettes. She quit after 2 years of use. She has never used smokeless tobacco. She reports that she does not drink alcohol or use illicit drugs. Allergies  Allergen Reactions  . Sulfa Antibiotics Anaphylaxis    Itching, breaks ou  . Aminophylline Itching  . Other Itching    Sometimes reacts to binders in generic drugs - can be treated with claritin  . Clarithromycin Itching and Rash  . Ezetimibe-Simvastatin Rash  . Penicillins Rash    Has  patient had a PCN reaction causing immediate rash, facial/tongue/throat swelling, SOB or lightheadedness with hypotension: Yes Has patient had a PCN reaction causing severe rash involving mucus membranes or skin necrosis: No Has patient had a PCN reaction that required hospitalization unknown Has patient had a PCN reaction occurring within the last 10 years: No If all of the above answers are "NO", then may proceed with Cephalosporin use.  Marland Kitchen Propoxyphene Itching  . Rofecoxib Rash   Prior to Admission medications   Medication Sig Start Date End Date Taking? Authorizing Provider  acetaminophen (TYLENOL) 500 MG tablet Take 2 tablets (1,000 mg total) by mouth every 8 (eight) hours. 11/03/15   Su Hoff, MD  allopurinol (ZYLOPRIM) 100 MG tablet Take 100 mg by mouth at bedtime.     Historical Provider, MD  aspirin EC 81 MG tablet Take 81 mg by mouth daily.    Historical Provider, MD  cinacalcet (SENSIPAR) 30 MG tablet Take 30 mg by mouth at bedtime.    Historical Provider, MD  clopidogrel (PLAVIX) 75 MG tablet Take 75 mg by mouth daily.     Historical Provider, MD  Lidocaine 2 % GEL Apply 1 application topically 4 (four) times daily as needed. 11/03/15   Carly Arlyce Harman, MD  loratadine (CLARITIN) 10 MG tablet Take 10 mg by mouth at bedtime.    Historical Provider, MD  midodrine (PROAMATINE) 10 MG tablet Take 10 mg by mouth See admin instructions. Take 1 tablet (10 mg) by mouth daily at bedtime, may take an additional tablet prior to dialysis as needed for low blood pressure    Historical Provider, MD  multivitamin (RENA-VIT) TABS tablet Take 1 tablet by mouth at bedtime.    Historical Provider, MD  Nutritional Supplements (FEEDING SUPPLEMENT, NEPRO CARB STEADY,) LIQD Take 237 mLs by mouth 3 (three) times daily with meals. 11/03/15   Su Hoff, MD  pyridOXINE (VITAMIN B-6) 100 MG tablet Take 100 mg by mouth at bedtime.     Historical Provider, MD  sevelamer (RENVELA) 800 MG tablet Take 800 mg by mouth  at bedtime.     Historical Provider, MD  thiamine (VITAMIN B-1) 100 MG tablet Take 100 mg by mouth at bedtime.     Historical Provider, MD   Current Facility-Administered Medications  Medication Dose Route Frequency Provider Last Rate Last Dose  . sodium chloride 0.9 % bolus 500 mL  500 mL Intravenous Once Leta Baptist, MD      . Melene Muller ON 11/07/2015] vancomycin [facility])  500 mg in sodium chloride 0.9 % 100 mL IVPB  500 mg Intravenous Q T,Th,Sa-HD Rachel L Rumbarger, RPH      . vancomycin (VANCOCIN) IVPB 1000 mg/200 mL premix  1,000 mg Intravenous Once Rosaland Lao, RPH 200 mL/hr at 11/05/15 1422 1,000 mg at 11/05/15 1422   Current Outpatient Prescriptions  Medication Sig Dispense Refill  . acetaminophen (TYLENOL) 500 MG tablet Take 2 tablets (1,000 mg total) by mouth every 8 (eight) hours. 30 tablet 0  . allopurinol (ZYLOPRIM) 100 MG tablet Take 100 mg by mouth at bedtime.     Marland Kitchen aspirin EC 81 MG tablet Take 81 mg by mouth daily.    . cinacalcet (SENSIPAR) 30 MG tablet Take 30 mg by mouth at bedtime.    . clopidogrel (PLAVIX) 75 MG tablet Take 75 mg by mouth daily.     . Lidocaine 2 % GEL Apply 1 application topically 4 (four) times daily as needed. 1 Tube 1  . loratadine (CLARITIN) 10 MG tablet Take 10 mg by mouth at bedtime.    . midodrine (PROAMATINE) 10 MG tablet Take 10 mg by mouth See admin instructions. Take 1 tablet (10 mg) by mouth daily at bedtime, may take an additional tablet prior to dialysis as needed for low blood pressure    . multivitamin (RENA-VIT) TABS tablet Take 1 tablet by mouth at bedtime.    . Nutritional Supplements (FEEDING SUPPLEMENT, NEPRO CARB STEADY,) LIQD Take 237 mLs by mouth 3 (three) times daily with meals. 30 Can 0  . pyridOXINE (VITAMIN B-6) 100 MG tablet Take 100 mg by mouth at bedtime.     . sevelamer (RENVELA) 800 MG tablet Take 800 mg by mouth at bedtime.     . thiamine (VITAMIN B-1) 100 MG tablet Take 100 mg by mouth at bedtime.       Labs: Basic Metabolic Panel:  Recent Labs Lab 11/01/15 0746 11/02/15 0538 11/05/15 1210  NA 138 138 134*  K 4.2 4.2 5.1  CL 101 102 98*  CO2 27 31 26   GLUCOSE 101* 78 98  BUN 7 <5* 18  CREATININE 4.37* 2.72* 6.31*  CALCIUM 8.7* 7.8* 8.2*  PHOS  --  2.3*  --    Liver Function Tests:  Recent Labs Lab 11/02/15 0538 11/05/15 1210  AST  --  41  ALT  --  24  ALKPHOS  --  156*  BILITOT  --  1.4*  PROT  --  4.5*  ALBUMIN 1.5* 1.6*  CBC:  Recent Labs Lab 11/01/15 0746 11/02/15 0538 11/03/15 0929 11/05/15 1210  WBC 10.4 6.0 7.1 6.8  NEUTROABS 9.2*  --   --  5.9  HGB 11.0* 10.7* 11.3* 11.0*  HCT 34.7* 33.1* 34.1* 33.7*  MCV 92.3 90.7 88.3 89.2  PLT 61* 39* 54* 66*   Cardiac Enzymes:  Recent Labs Lab 11/05/15 1210  TROPONINI 0.35*  Studies/Results: Dg Chest 2 View  11/05/2015  CLINICAL DATA:  Syncope and hypotension for the past day ; chronic renal failure on dialysis, atrial fibrillation. EXAM: CHEST  2 VIEW COMPARISON:  Portable chest x-ray of November 01, 2015 FINDINGS: The lungs are less well inflated today. There are R small bilateral pleural effusions greater on the left. The cardiac silhouette is enlarged. The pulmonary vascularity is engorged. The interstitial markings are increased in the left mid lung in the inferior aspect of the right upper lobe. IMPRESSION: Findings consistent with mild pulmonary interstitial edema likely secondary to CHF. Increased interstitial density  focally in the left lung and right upper lobe may reflect early pneumonia or early alveolar edema. Small bilateral pleural effusions are present. Electronically Signed   By: David  [place] M.D.   On: 11/05/2015 13:18    ROS: As per HPI otherwise negative.  Physical Exam: Filed Vitals:   11/05/15 1246 11/05/15 1317 11/05/15 1345 11/05/15 1415  BP: 90/65 88/58 94/62  95/68  Pulse:  99 75 105  Temp:      TempSrc:      Resp: 11 12 16 13   SpO2:  92%  96%     General:  Elderly AAF NAD but  unable to assist in moving herself on stretcher Head: NCAT sclera not icteric MMM Neck: Supple.  Lungs: dim BS anteriorly Breathing is unlabored. Heart: RRR with S1 S2.  Abdomen: soft NT + BS Lower extremities: 2- 3 + pitting LE edema blister on left heel Neuro: alert, HOH Dialysis Access: left lower AVGG + bruit  Dialysis Orders:  [facility] MWF 3.5 hours 160 EDW 64 (just raised from 62) 3 K 2.25 Ca Ab 450 A 1.5 profile 5 left lower AVGG; heparin 1000 Last Mircera 100 6/28 no VDRA  Assessment/Plan: 1. FTT - had chronic hypotension prior to admission; mobility issues with recent rib fractures - needs SNF placement or consider palliative care consult- Low BP has been a chronic problem but decline since last discharge may be from secondary infectious etiology 2. Possible PNA - IV Vanc and Maxipime started 3. Recent rib fracture due to recent fall- limited mobility and recent hospitalization increase risk for PNA 4. + troponin  - cards following for further evaluation 5. ESRD -  MWF - K 5.1 today - missed Monday HD; plan HD today - may need serial HD to UF volume 6. Chronic hypotension/excessvolume  - excess volume exam/CXR - missed Monday HD; BP low now and last week limited UF- midodrine for BP support - will add albumin today to support today - reassess in am for another HD treatment for further UF 7. Anemia  - hgb 11 stable - hold on redose of ESA unless hgb trends down 8. Metabolic bone disease -  Ca 8.2/ no VDRA - follow labs 9. Nutrition - alb 1.6 - needs renal diet/supplements/vitamins/prostat 10. Blister left heal - need to float heels - defer to primary  Sheffield Slider, PA-C [facility] Beeper [phone] 11/05/2015, 3:19 PM   Pt seen, examined and agree w A/P as above.  Vinson Moselle MD [employer] pager [phone]    cell [phone] 11/06/2015, 8:51 AM

## 2015-11-05 NOTE — Telephone Encounter (Signed)
Attempted to contact patient about low vitamin D level but no answer. Will fax to PCP.

## 2015-11-05 NOTE — Progress Notes (Signed)
Hemodialysis- Pt arrived to unit in no distress. Treatment initiated at 2308 without issue. Labs sent. Pt currently resting with no complaints. Alert and oriented x2. Order to keep sbp>80 on HD per PA written orders. Continue to monitor.

## 2015-11-05 NOTE — Telephone Encounter (Signed)
Opened in error

## 2015-11-05 NOTE — H&P (Signed)
Date: 11/05/2015               Patient Name:  [name] MRN: [id number]  DOB: 01-05-1928 Age / Sex: 82 y.o., female   PCP: No primary care provider on file.         Medical Service: Internal Medicine Teaching Service         Attending Physician: Dr. Doneen Poisson, MD    First Contact: Dr. Mikey Bussing Pager: [phone]  Second Contact: Dr. Beckie Salts Pager: [phone]       After Hours (After 5p/  First Contact Pager: [phone]  weekends / holidays): Second Contact Pager: [phone]   Chief Complaint: weakness  History of Present Illness: [name] is a 82 yo F with PMHx of PAF, ESRD on HD, HTN, HLD and thrombocytopenia who presents to the ED with weakness.   Patient states she was getting ready to go to dialysis today as she was discharged on 7/09 from the hospital.   Patient was scheduled to go to HD today as she was off her schedule and has not been dialyzed since Friday. Her usual schedule is MWF.  Patient's daughter states it took 5 people to get the patient into the car due to weakness and pain. Once they presented to HD, they stated she was "too far off baseline" for HD and suggested she go to the ED. Her BP via EMS was 70s/40s.   Since, discharge patient has been weak and continues to be in pain. She has been somnolent and slightly confused (forgetting if she took her medications), not eating and not drinking. Family admits that yesterday they were confused with the Tylenol prescription. Tylenol #3 was discontinued and discharged on Tylenol 1000 mg Q8H prn. However, family misinterpreted this thinking she was to get 3 of the Tylenol #3s to equal 1000 mg; therefore, patient received 90 mg of codeine one time. Last night she started to improve, but then was agitated last night trying to get out bed and fighting with her family.   Patient ate lunch today and was not able to keep it down.  She later had applesauce in the ED and has not had any episodes of vomiting. She admits to mild  stomach pain and attributes it to not eating well.  Patient denies SOB, Chest pain, lightheadedness, dysuria.   Meds: Current Facility-Administered Medications  Medication Dose Route Frequency Provider Last Rate Last Dose  . midodrine (PROAMATINE) tablet 10 mg  10 mg Oral Once in dialysis Weston Settle, PA-C      . Melene Muller ON 11/07/2015] vancomycin (VANCOCIN) 500 mg in sodium chloride 0.9 % 100 mL IVPB  500 mg Intravenous Q T,Th,Sa-HD Faye Ramsay Rumbarger, [facility]       Current Outpatient Prescriptions  Medication Sig Dispense Refill  . acetaminophen (TYLENOL) 500 MG tablet Take 2 tablets (1,000 mg total) by mouth every 8 (eight) hours. 30 tablet 0  . allopurinol (ZYLOPRIM) 100 MG tablet Take 100 mg by mouth at bedtime.     Marland Kitchen aspirin EC 81 MG tablet Take 81 mg by mouth daily.    . cinacalcet (SENSIPAR) 30 MG tablet Take 30 mg by mouth at bedtime.    . clopidogrel (PLAVIX) 75 MG tablet Take 75 mg by mouth daily.     Marland Kitchen GLUCERNA (GLUCERNA) LIQD Take 237 mLs by mouth at bedtime.    Marland Kitchen loratadine (CLARITIN) 10 MG tablet Take 10 mg by mouth at bedtime.    . midodrine (PROAMATINE) 10  MG tablet Take 10 mg by mouth See admin instructions. Take 1 tablet (10 mg) by mouth daily at bedtime, may take an additional tablet prior to dialysis as needed for low blood pressure    . multivitamin (RENA-VIT) TABS tablet Take 1 tablet by mouth at bedtime.    . pantoprazole (PROTONIX) 40 MG tablet Take 40 mg by mouth at bedtime.    . pyridOXINE (VITAMIN B-6) 100 MG tablet Take 100 mg by mouth at bedtime.     . sevelamer (RENVELA) 800 MG tablet Take 800 mg by mouth at bedtime.     . thiamine (VITAMIN B-1) 100 MG tablet Take 100 mg by mouth at bedtime.     . Lidocaine 2 % GEL Apply 1 application topically 4 (four) times daily as needed. (Patient not taking: Reported on 11/05/2015) 1 Tube 1  . Nutritional Supplements (FEEDING SUPPLEMENT, NEPRO CARB STEADY,) LIQD Take 237 mLs by mouth 3 (three) times daily with meals. (Patient  not taking: Reported on 11/05/2015) 30 Can 0    Allergies: Allergies as of 11/05/2015 - Review Complete 11/05/2015  Allergen Reaction Noted  . Sulfa antibiotics Anaphylaxis 07/24/2010  . Aminophylline Itching 08/01/2012  . Other Itching 11/01/2015  . Clarithromycin Itching and Rash 07/24/2010  . Ezetimibe-simvastatin Rash 11/01/2015  . Penicillins Rash 07/24/2010  . Propoxyphene Itching 11/01/2015  . Rofecoxib Rash 11/01/2015   Past Medical History  Diagnosis Date  . Chest pain 07/07/10  . Hypertension   . Edema 07/24/10    of ankles  . Generalized headaches 03/12  . Lightheadedness 07/24/10  . Reflux   . Fatigue   . Arthritis   . Gout   . Hyperglycemia     Borderline diabetes  . Peptic ulcer disease   . AF (paroxysmal atrial fibrillation) (HCC)     Used to be on Warfarin in the past but was taken off due to falls, memory problems.  . CHF (congestive heart failure) (HCC)     30 years ago  . GERD (gastroesophageal reflux disease)   . Diabetes mellitus without complication (HCC)     borderline    . End stage renal disease on dialysis [facility]) since 2010    related to Vioxx, HD T-TH-SAT    Family History:  Family History  Problem Relation Age of Onset  . Cancer Father 65    throat cancer  . Other Mother     mother died from burns  . Kidney disease Sister   . CAD Neg Hx   . Diabetes Sister   . Diabetes Brother   . Diabetes Mother      Social History:  Social History   Social History  . Marital Status: Widowed    Spouse Name: N/A  . Number of Children: N/A  . Years of Education: N/A   Occupational History  . Not on file.   Social History Main Topics  . Smoking status: Former Smoker -- 2 years    Types: Cigarettes    Quit date: 04/28/1979  . Smokeless tobacco: Never Used     Comment: rare - 1-2x/week, quit 30 yrs ago  . Alcohol Use: No  . Drug Use: No  . Sexual Activity: Not on file   Other Topics Concern  . Not on file   Social History Narrative    Lives at home by herself, has home health services         Review of Systems: Review of Systems  Constitutional: Negative for fever and chills.  Respiratory: Positive for cough. Negative for shortness of breath.        Patient states she has a cough, family members who have been with her for the past couple of days says that they have not seen her cough  Cardiovascular: Positive for leg swelling. Negative for chest pain.  Gastrointestinal: Positive for vomiting. Negative for nausea and diarrhea.  Genitourinary: Negative for dysuria and urgency.  Musculoskeletal: Positive for back pain.       Left Ribs 4, 9,10 fractured  Neurological: Positive for weakness. Negative for dizziness.  All other systems reviewed and are negative.   Physical Exam: Blood pressure 97/66, pulse 105, temperature 98.5 F (36.9 C), temperature source Rectal, resp. rate 20, SpO2 96 %. Physical Exam  Constitutional:  Appears somnolent  HENT:  Head: Normocephalic and atraumatic.  Eyes: Pupils are equal, round, and reactive to light.  Cardiovascular:  Irregularly Irregular  Pulmonary/Chest: Effort normal.  Patient unable to move forward or roll to the side to auscultate lungs, due to pain from her left rib fractures  Anterior and lateral chest breath sounds CTA b/l  Abdominal: Soft. There is no tenderness.  Musculoskeletal:  Positive for bilateral lower leg swelling  Neurological:  Patient was slow to respond but was able to tell me she was [facility], the year and month.    Skin: Skin is warm.    CXR: DG Chest 2 View:  Findings consistent with mild pulmonary interstitial edema likely secondary to CHF. Increased interstitial density focally in the left lung and right upper lobe may reflect early pneumonia or early alveolar edema. Small bilateral pleural effusions are present.  Assessment & Plan by Problem:  Generalized Weakness Patient lives at home alone, but has been with family  since discharged on 7/9.  When she lived at home by herself she received help from home health a couple of hours a day.  Patient did not use a can or walker prior to her fall and fractured ribs 7/7.  Patient has been immobile for the past several days and this is likely due to her pain from her recently fractured ribs.  Her weakness could be attributed to not being ambulatory for several days.  Family admits to giving patient 3 of the Tylenol #3s to equal 1000 mg, receiving a total of 90 mg of codeine at one time.  This may be the cause of her somnolence in the past 24 hrs and before her hemodialysis.   -PT/OT evaluation and treatment - Do not start home med tylenol #3s   Lactic Acidosis On admission patient's lactic acid was 3.97 it has trended upward to 5.46.  Patient was given 1L of fluids in the ED.  Suspicion for infection is low.  Patients WBC is 6.8 and no fevers.  However, chest x-ray showed increased interstitial density focally in the left lung and right upper lob that may reflect early pneumonia or early alveolar edema.  Patient reports having a cough but with no sputum production.  She denies fever or chills.  Patient was given one dose of vancomycin 1000mg  and cefepime 2g in the ED.  UA was negative for nitrites and patient denies any dysuria.   Because of patients clinical presentation I do not believe continuing ED antibiotics of vancomycin and cefepime is warranted at this time.  Will reassess CBC in the morning.  Patient has been hypotensive blood pressures are 90s/60s but this appears to be her baseline.  Patient also refuses to drink  fluids thinking that it will increase her leg swelling.  Lactic acidosis is likely due to a combination of low BP and possible dehydration.   - Will give fluids if lactic acid increases.  Because patient has ESRD do not want to fluid overload. - CBC in am      Rib fracture Patient was admitted on 7/7 for rib fracture.  Chest x ray showed a fracture to the  forth, ninth and tenth ribs.Patient had fallen when getting out of bed and hit her night stand. She attributes the fall to loss of strength.  Patient continues to have pain to her left lateral chest wall.  She also states she has pain to the left posterior Chest wall.  - Tylenol 100mg  TID  - Lidocaine gel TID  End stage renal failure on dialysis [facility]) Patient has dialysis [facility]. Patient missed dialysis on Monday and went this morning (tuesday 7/11).  Due to being too weak HD recommended patient be taken to the hospital for dialysis.  Nephrology was consulted - Awaiting inpatient dialysis  - Continuing home meds for ESRD  Thrombocytopenia [facility]) Patient had low platelets on prior admission.  Would need to see outpatient CBC to see if this is a new or old issue.  She has no over bleeding. - Will hold plavix - she is taking for her afib   Dispo: Admit patient to Observation with expected length of stay less than 2 midnights.  Signed: Camelia Phenes, DO 11/01/2015, 1:19 PM  Pager: [phone]

## 2015-11-05 NOTE — ED Notes (Signed)
Per ems- Pt comes from dialysis center where she was taken for her session. When she arrived she was unable to get out of the car due to passing out. BP 74/50, she is lethargic but weak, CBG 128. Is alert and oriented. Unable to establish IV access. BP 100/56 upon arrival.

## 2015-11-05 NOTE — ED Notes (Signed)
Apple sauce given to pt 

## 2015-11-05 NOTE — Consult Note (Signed)
CARDIOLOGY CONSULT NOTE   Patient ID: [name] MRN: [id number] DOB/AGE: [birth year] 82 y.o.  Admit date: 11/05/2015  Primary Physician   No primary care provider on file. Primary Cardiologist: New, saw Dr. Kirke Corin in 2012 Requesting MD: Dr. Cyndie Chime Reason for Consultation: Elevated troponin  HPI: [name] is a 82 year old female with a past medical history of HTN, PAF (not on anticoagulation due to frequent falls), DM, and ESRD on HD.   She presented to the ED on 11/05/15 with weakness and lethargy. She was supposed to go for hemodialysis today, but  she was very weak, couldn't sit up in the car. She had to be carried to the car because she couldn't walk. When she arrived to the HD center she was hypotensive, and EMS was called. According to her family she has been declining recently. She had a recent fall 4 days ago when she tried to get out of bed by herself. She is currently living with her granddaughter.   Upon arrival to the ED, her troponin was 0.35. She was hypotensive with initial BP of 83/53. Chest X ray shows questionable pulmonary edema and infiltrates in right and left lobes that could be pneumonia.  EKG shows T-wave inversion and ST depression in anterior leads. She is not having any chest pain. Her EKG from Sept 2014 shows T wave inversion in anterior leads.   Denies family history of CAD. Not a current smoker, does not drink ETOH. She has a remote history of diastolic CHF, last Echo was in 2009 (report below).    Past Medical History  Diagnosis Date  . Chest pain 07/07/10  . Hypertension   . Edema 07/24/10    of ankles  . Generalized headaches 03/12  . Lightheadedness 07/24/10  . Reflux   . Fatigue   . Arthritis   . Gout   . Hyperglycemia     Borderline diabetes  . Peptic ulcer disease   . AF (paroxysmal atrial fibrillation) (HCC)     Used to be on Warfarin in the past but was taken off due to falls, memory problems.  . CHF (congestive heart  failure) (HCC)     30 years ago  . GERD (gastroesophageal reflux disease)   . Diabetes mellitus without complication (HCC)     borderline    . End stage renal disease on dialysis [facility]) since 2010    related to Vioxx, HD T-TH-SAT     Past Surgical History  Procedure Laterality Date  . Hemodialysis catheter      & Placement left forearm AV graft   . Arteriovenous graft placement    . Abdominal hysterectomy    . Cholecystectomy    . Eye surgery      bilateral cataracts  . Revision of arteriovenous goretex graft Left 01/06/2013    Procedure: REVISION OF ARTERIOVENOUS GORETEX GRAFT;  Surgeon: Chuck Hint, MD;  Location: [facility] OR;  Service: Vascular;  Laterality: Left;    Allergies  Allergen Reactions  . Sulfa Antibiotics Anaphylaxis    Itching, breaks ou  . Aminophylline Itching  . Other Itching    Sometimes reacts to binders in generic drugs - can be treated with claritin  . Clarithromycin Itching and Rash  . Ezetimibe-Simvastatin Rash  . Penicillins Rash    Has patient had a PCN reaction causing immediate rash, facial/tongue/throat swelling, SOB or lightheadedness with hypotension: Yes Has patient had a PCN reaction causing severe rash involving mucus membranes or  skin necrosis: No Has patient had a PCN reaction that required hospitalization unknown Has patient had a PCN reaction occurring within the last 10 years: No If all of the above answers are "NO", then may proceed with Cephalosporin use.  Marland Kitchen. Propoxyphene Itching  . Rofecoxib Rash    I have reviewed the patient's current medications . [START ON 11/07/2015] vancomycin  500 mg Intravenous Q T,Th,Sa-HD   . vancomycin 1,000 mg (11/05/15 1422)     Prior to Admission medications   Medication Sig Start Date End Date Taking? Authorizing Provider  acetaminophen (TYLENOL) 500 MG tablet Take 2 tablets (1,000 mg total) by mouth every 8 (eight) hours. 11/03/15   Su Hoffarly J Rivet, MD  allopurinol (ZYLOPRIM) 100 MG tablet Take  100 mg by mouth at bedtime.     Historical Provider, MD  aspirin EC 81 MG tablet Take 81 mg by mouth daily.    Historical Provider, MD  cinacalcet (SENSIPAR) 30 MG tablet Take 30 mg by mouth at bedtime.    Historical Provider, MD  clopidogrel (PLAVIX) 75 MG tablet Take 75 mg by mouth daily.     Historical Provider, MD  Lidocaine 2 % GEL Apply 1 application topically 4 (four) times daily as needed. 11/03/15   Carly Arlyce HarmanJ Rivet, MD  loratadine (CLARITIN) 10 MG tablet Take 10 mg by mouth at bedtime.    Historical Provider, MD  midodrine (PROAMATINE) 10 MG tablet Take 10 mg by mouth See admin instructions. Take 1 tablet (10 mg) by mouth daily at bedtime, may take an additional tablet prior to dialysis as needed for low blood pressure    Historical Provider, MD  multivitamin (RENA-VIT) TABS tablet Take 1 tablet by mouth at bedtime.    Historical Provider, MD  Nutritional Supplements (FEEDING SUPPLEMENT, NEPRO CARB STEADY,) LIQD Take 237 mLs by mouth 3 (three) times daily with meals. 11/03/15   Su Hoffarly J Rivet, MD  pyridOXINE (VITAMIN B-6) 100 MG tablet Take 100 mg by mouth at bedtime.     Historical Provider, MD  sevelamer (RENVELA) 800 MG tablet Take 800 mg by mouth at bedtime.     Historical Provider, MD  thiamine (VITAMIN B-1) 100 MG tablet Take 100 mg by mouth at bedtime.     Historical Provider, MD     Social History   Social History  . Marital Status: Widowed    Spouse Name: N/A  . Number of Children: N/A  . Years of Education: N/A   Occupational History  . Not on file.   Social History Main Topics  . Smoking status: Former Smoker -- 2 years    Types: Cigarettes    Quit date: 04/28/1979  . Smokeless tobacco: Never Used     Comment: rare - 1-2x/week, quit 30 yrs ago  . Alcohol Use: No  . Drug Use: No  . Sexual Activity: Not on file   Other Topics Concern  . Not on file   Social History Narrative   Lives at home by herself, has home health services        Family Status  Relation  Status Death Age  . Mother Deceased 10086    burned/diabetic  . Father Deceased   . Sister Deceased 7355    kidneys  . Brother Deceased   . Brother Deceased    Family History  Problem Relation Age of Onset  . Cancer Father 2177    throat cancer  . Other Mother     mother died from burns  .  Kidney disease Sister   . CAD Neg Hx   . Diabetes Sister   . Diabetes Brother      ROS:  Full 14 point review of systems complete and found to be negative unless listed above.  Physical Exam: Blood pressure 95/68, pulse 105, temperature 98.5 F (36.9 C), temperature source Rectal, resp. rate 13, SpO2 96 %.  General: Well developed, well nourished,elderly female in no acute distress Head: Eyes PERRLA, No xanthomas.  Normocephalic and atraumatic, oropharynx without edema or exudate.  Lungs: Diminished in bases.  Heart: HRRR S1 S2, no rub/gallop,  No murmur. pulses are 2+ extrem.   Neck: No carotid bruits. No lymphadenopathy. no JVD. Abdomen: Bowel sounds present, abdomen soft and non-tender without masses or hernias noted. Msk:  No spine or cva tenderness. No weakness, no joint deformities or effusions. Extremities: No clubbing or cyanosis. +1 pedal edema.  Neuro: Alert and oriented X 3. No focal deficits noted. Psych:  Good affect, responds appropriately Skin: No rashes or lesions noted.  Labs:   Lab Results  Component Value Date   WBC 6.8 11/05/2015   HGB 11.0* 11/05/2015   HCT 33.7* 11/05/2015   MCV 89.2 11/05/2015   PLT 66* 11/05/2015    Recent Labs Lab 11/05/15 1210  NA 134*  K 5.1  CL 98*  CO2 26  BUN 18  CREATININE 6.31*  CALCIUM 8.2*  PROT 4.5*  BILITOT 1.4*  ALKPHOS 156*  ALT 24  AST 41  GLUCOSE 98  ALBUMIN 1.6*     Recent Labs  11/05/15 1210  TROPONINI 0.35*    Recent Labs  11/05/15 1221  TROPIPOC 0.20*    Echo: Last was in 2009 LEFT VENTRICLE: - Left ventricular size was normal. - Overall left ventricular systolic function was normal. -  Left ventricular ejection fraction was estimated , range being 55    % to 65 %. - There were no left ventricular regional wall motion    abnormalities. - Left ventricular wall thickness was mildly increased.  Doppler interpretation(s): - Features were consistent with mild diastolic dysfunction  ECG: Sinus tach, prolonged QTC .510, low voltage QRS.   Radiology:  Dg Chest 2 View  11/05/2015  CLINICAL DATA:  Syncope and hypotension for the past day ; chronic renal failure on dialysis, atrial fibrillation. EXAM: CHEST  2 VIEW COMPARISON:  Portable chest x-ray of November 01, 2015 FINDINGS: The lungs are less well inflated today. There are R small bilateral pleural effusions greater on the left. The cardiac silhouette is enlarged. The pulmonary vascularity is engorged. The interstitial markings are increased in the left mid lung in the inferior aspect of the right upper lobe. IMPRESSION: Findings consistent with mild pulmonary interstitial edema likely secondary to CHF. Increased interstitial density focally in the left lung and right upper lobe may reflect early pneumonia or early alveolar edema. Small bilateral pleural effusions are present. Electronically Signed   By: David  [place] M.D.   On: 11/05/2015 13:18    ASSESSMENT AND PLAN:    Active Problems:   * No active hospital problems. *  1. Elevated troponin: Patient presents with weakness and lethargy. She has elevated troponin of 0.35. EKG shows some T wave inversion in anterior leads, this is consistent with her previous EKG. She is chest pain free. We will cycle troponin and follow. She is being treated for CAP. Does not seem that this is the elevation represents true myocardial ischemia. Also, of note she has not received dialysis since  Friday 11/01/15.   2. History of diastolic CHF: last echo in 2009, had some mild diastolic dysfunction. Can repeat Echo this admission. Her volume is managed by dialysis.   3. ESRD: Renal is  following.   4. PAF: In NSR now, not on any anticoagulation due to memory loss and frequent falls.    Signed: Little Ishikawa, NP 11/05/2015 3:01 PM Pager [phone]  Co-Sign MD The patient has been seen in conjunction with Suzzette Righter, NP. All aspects of care have been considered and discussed. The patient has been personally interviewed, examined, and all clinical data has been reviewed.   82 year old end-stage renal disease patient, 4 days since last dialysis, presented with hypotension to the outpatient dialysis center and was sent to the ER.  Blood pressure is now back to her baseline with a systolic of around 92 mmHg. She has no complaints.  A single troponin was mildly elevated. She has no complaints of chest discomfort and EKG is nonischemic. I do not believe her presentation represents ACS.  The recommendations above are accurate. No ischemic evaluation will be necessary unless there is a surprising ischemic trend in troponin levels. We will follow-up in a.m.

## 2015-11-05 NOTE — Progress Notes (Signed)
Pharmacy Antibiotic Note  [name] is a 82 y.o. female admitted on 11/05/2015 with pneumonia.  Pharmacy has been consulted for vancomycin dosing. Pt is afebrile and WBC is WNL. Pt is ESRD on HD. Lactic acid is elevated but trending down.   Plan: - Vancomycin 1gm IV x 1 then 500mg  TTS with HD - F/u renal plans, C&S, clinical status and pre-HD level when appropriate     Temp (24hrs), Avg:98.5 F (36.9 C), Min:98.5 F (36.9 C), Max:98.5 F (36.9 C)   Recent Labs Lab 11/01/15 0746 11/02/15 0538 11/03/15 0929 11/05/15 1114 11/05/15 1210 11/05/15 1224  WBC 10.4 6.0 7.1  --  6.8  --   CREATININE 4.37* 2.72*  --   --  6.31*  --   LATICACIDVEN  --   --   --  4.63*  --  3.97*    Estimated Creatinine Clearance: 5.9 mL/min (by C-G formula based on Cr of 6.31).    Allergies  Allergen Reactions  . Sulfa Antibiotics Anaphylaxis    Itching, breaks ou  . Aminophylline Itching  . Other Itching    Sometimes reacts to binders in generic drugs - can be treated with claritin  . Clarithromycin Itching and Rash  . Ezetimibe-Simvastatin Rash  . Penicillins Rash    Has patient had a PCN reaction causing immediate rash, facial/tongue/throat swelling, SOB or lightheadedness with hypotension: Yes Has patient had a PCN reaction causing severe rash involving mucus membranes or skin necrosis: No Has patient had a PCN reaction that required hospitalization unknown Has patient had a PCN reaction occurring within the last 10 years: No If all of the above answers are "NO", then may proceed with Cephalosporin use.  Marland Kitchen. Propoxyphene Itching  . Rofecoxib Rash    Antimicrobials this admission: Vanc 7/11>> Cefepime x 1 7/11  Dose adjustments this admission: N/A  Microbiology results: Pending  Thank you for allowing pharmacy to be a part of this patient's care.  [name], [name] 11/05/2015 1:41 PM

## 2015-11-05 NOTE — ED Notes (Signed)
Dr. Cyndie ChimeNguyen at bedside trying to establish EJ for pt access.

## 2015-11-05 NOTE — ED Provider Notes (Signed)
CSN: [id number]     Arrival date & time 11/05/15  1053 History   First MD Initiated Contact with Patient 11/05/15 1102     Chief Complaint  Patient presents with  . Hypotension     (Consider location/radiation/quality/duration/timing/severity/associated sxs/prior Treatment) HPI Comments: 82 year old female with complicated past medical history including end-stage renal disease on dialysis, paroxysmal atrial fibrillation presents for hypotension and weakness. The patient says that she Sliding down in the car on the way to dialysis this morning because she was too weak. She is not able to give a full history but is able to answer some questions. She is extremely hard of hearing. She reports she does feel generally weak. Per family the patient has not been doing well at home. She was discharged from the hospital just 2 days ago after being admitted for rib fracture. She has not had dialysis since Friday night. At home the patient has been too weak to get up and move around. Today they needed 5 people to get her into the car to take her to dialysis. When he arrived at dialysis the patient was hypotensive and was too weak to stand and so she was sent to the emergency department here. Family says that until recently she would be able to get up on her own go to the bathroom and even make herself simple meals at home. She has progressively been declining and has had to move in with her relatives.   Past Medical History  Diagnosis Date  . Chest pain 07/07/10  . Hypertension   . Edema 07/24/10    of ankles  . Generalized headaches 03/12  . Lightheadedness 07/24/10  . Reflux   . Fatigue   . Arthritis   . Gout   . Hyperglycemia     Borderline diabetes  . Peptic ulcer disease   . AF (paroxysmal atrial fibrillation) (HCC)     Used to be on Warfarin in the past but was taken off due to falls, memory problems.  . CHF (congestive heart failure) (HCC)     30 years ago  . GERD (gastroesophageal reflux  disease)   . Diabetes mellitus without complication (HCC)     borderline    . End stage renal disease on dialysis [facility]) since 2010    related to Vioxx, HD T-TH-SAT   Past Surgical History  Procedure Laterality Date  . Hemodialysis catheter      & Placement left forearm AV graft   . Arteriovenous graft placement    . Abdominal hysterectomy    . Cholecystectomy    . Eye surgery      bilateral cataracts  . Revision of arteriovenous goretex graft Left 01/06/2013    Procedure: REVISION OF ARTERIOVENOUS GORETEX GRAFT;  Surgeon: Chuck Hint, MD;  Location: [facility] OR;  Service: Vascular;  Laterality: Left;   Family History  Problem Relation Age of Onset  . Cancer Father 54    throat cancer  . Other Mother     mother died from burns  . Kidney disease Sister   . CAD Neg Hx   . Diabetes Sister   . Diabetes Brother   . Diabetes Mother    Social History  Substance Use Topics  . Smoking status: Former Smoker -- 2 years    Types: Cigarettes    Quit date: 04/28/1979  . Smokeless tobacco: Never Used     Comment: rare - 1-2x/week, quit 30 yrs ago  . Alcohol Use: No   OB  History    No data available     Review of Systems  Constitutional: Negative for fever, diaphoresis and fatigue.  HENT: Negative for congestion and postnasal drip.   Respiratory: Negative for chest tightness and shortness of breath.   Cardiovascular: Negative for chest pain, palpitations and leg swelling.  Gastrointestinal: Negative for vomiting and diarrhea.  Genitourinary: Negative for decreased urine volume.       Makes very little urine at baseline  Musculoskeletal: Negative for myalgias and back pain.  Skin: Negative for pallor.  Neurological: Positive for weakness (generalized). Negative for headaches.  Hematological: Does not bruise/bleed easily.      Allergies  Sulfa antibiotics; Aminophylline; Other; Clarithromycin; Ezetimibe-simvastatin; Penicillins; Propoxyphene; and Rofecoxib  Home  Medications   Prior to Admission medications   Medication Sig Start Date End Date Taking? Authorizing Provider  acetaminophen (TYLENOL) 500 MG tablet Take 2 tablets (1,000 mg total) by mouth every 8 (eight) hours. 11/03/15  Yes Carly Arlyce Harman, MD  allopurinol (ZYLOPRIM) 100 MG tablet Take 100 mg by mouth at bedtime.    Yes Historical Provider, MD  aspirin EC 81 MG tablet Take 81 mg by mouth daily.   Yes Historical Provider, MD  cinacalcet (SENSIPAR) 30 MG tablet Take 30 mg by mouth at bedtime.   Yes Historical Provider, MD  clopidogrel (PLAVIX) 75 MG tablet Take 75 mg by mouth daily.    Yes Historical Provider, MD  GLUCERNA (GLUCERNA) LIQD Take 237 mLs by mouth at bedtime.   Yes Historical Provider, MD  loratadine (CLARITIN) 10 MG tablet Take 10 mg by mouth at bedtime.   Yes Historical Provider, MD  midodrine (PROAMATINE) 10 MG tablet Take 10 mg by mouth See admin instructions. Take 1 tablet (10 mg) by mouth daily at bedtime, may take an additional tablet prior to dialysis as needed for low blood pressure   Yes Historical Provider, MD  multivitamin (RENA-VIT) TABS tablet Take 1 tablet by mouth at bedtime.   Yes Historical Provider, MD  pantoprazole (PROTONIX) 40 MG tablet Take 40 mg by mouth at bedtime.   Yes Historical Provider, MD  pyridOXINE (VITAMIN B-6) 100 MG tablet Take 100 mg by mouth at bedtime.    Yes Historical Provider, MD  sevelamer (RENVELA) 800 MG tablet Take 800 mg by mouth at bedtime.    Yes Historical Provider, MD  thiamine (VITAMIN B-1) 100 MG tablet Take 100 mg by mouth at bedtime.    Yes Historical Provider, MD  Lidocaine 2 % GEL Apply 1 application topically 4 (four) times daily as needed. Patient not taking: Reported on 11/05/2015 11/03/15   Su Hoff, MD  Nutritional Supplements (FEEDING SUPPLEMENT, NEPRO CARB STEADY,) LIQD Take 237 mLs by mouth 3 (three) times daily with meals. Patient not taking: Reported on 11/05/2015 11/03/15   Iris Pert Rivet, MD   BP 97/66 mmHg  Pulse  105  Temp(Src) 98.5 F (36.9 C) (Rectal)  Resp 20  SpO2 96% Physical Exam  Constitutional: No distress.  HENT:  Head: Normocephalic and atraumatic.  Eyes: EOM are normal. Right eye exhibits no discharge. Left eye exhibits no discharge.  Neck: Normal range of motion. Neck supple.  Cardiovascular: Intact distal pulses.  Tachycardia present.   Pulmonary/Chest: Effort normal. No respiratory distress. She has no wheezes. She has no rales.  Abdominal: Soft. She exhibits no distension. There is no tenderness. There is no rebound.  Musculoskeletal: She exhibits no edema.  Neurological: She is alert. She exhibits normal muscle tone.  Skin:  She is not diaphoretic. No erythema. No pallor.  Vitals reviewed.   ED Course  Procedures (including critical care time) Labs Review Labs Reviewed  CBC WITH DIFFERENTIAL/PLATELET - Abnormal; Notable for the following:    RBC 3.78 (*)    Hemoglobin 11.0 (*)    HCT 33.7 (*)    RDW 19.6 (*)    Platelets 66 (*)    Lymphs Abs 0.5 (*)    All other components within normal limits  COMPREHENSIVE METABOLIC PANEL - Abnormal; Notable for the following:    Sodium 134 (*)    Chloride 98 (*)    Creatinine, Ser 6.31 (*)    Calcium 8.2 (*)    Total Protein 4.5 (*)    Albumin 1.6 (*)    Alkaline Phosphatase 156 (*)    Total Bilirubin 1.4 (*)    GFR calc non Af Amer 5 (*)    GFR calc Af Amer 6 (*)    All other components within normal limits  URINALYSIS, ROUTINE W REFLEX MICROSCOPIC (NOT [facility]) - Abnormal; Notable for the following:    Color, Urine AMBER (*)    APPearance CLOUDY (*)    Hgb urine dipstick MODERATE (*)    Bilirubin Urine MODERATE (*)    Ketones, ur 15 (*)    Protein, ur 30 (*)    Leukocytes, UA LARGE (*)    All other components within normal limits  TROPONIN I - Abnormal; Notable for the following:    Troponin I 0.35 (*)    All other components within normal limits  URINE MICROSCOPIC-ADD ON - Abnormal; Notable for the following:     Squamous Epithelial / LPF 6-30 (*)    Bacteria, UA MANY (*)    All other components within normal limits  I-STAT CG4 LACTIC ACID, ED - Abnormal; Notable for the following:    Lactic Acid, Venous 4.63 (*)    All other components within normal limits  I-STAT TROPOININ, ED - Abnormal; Notable for the following:    Troponin i, poc 0.20 (*)    All other components within normal limits  I-STAT CG4 LACTIC ACID, ED - Abnormal; Notable for the following:    Lactic Acid, Venous 3.97 (*)    All other components within normal limits  I-STAT CG4 LACTIC ACID, ED - Abnormal; Notable for the following:    Lactic Acid, Venous 5.46 (*)    All other components within normal limits  CULTURE, BLOOD (ROUTINE X 2)  CULTURE, BLOOD (ROUTINE X 2)  LACTIC ACID, PLASMA  LACTIC ACID, PLASMA  I-STAT CHEM 8, ED  I-STAT CG4 LACTIC ACID, ED    Imaging Review Dg Chest 2 View  11/05/2015  CLINICAL DATA:  Syncope and hypotension for the past day ; chronic renal failure on dialysis, atrial fibrillation. EXAM: CHEST  2 VIEW COMPARISON:  Portable chest x-ray of November 01, 2015 FINDINGS: The lungs are less well inflated today. There are R small bilateral pleural effusions greater on the left. The cardiac silhouette is enlarged. The pulmonary vascularity is engorged. The interstitial markings are increased in the left mid lung in the inferior aspect of the right upper lobe. IMPRESSION: Findings consistent with mild pulmonary interstitial edema likely secondary to CHF. Increased interstitial density focally in the left lung and right upper lobe may reflect early pneumonia or early alveolar edema. Small bilateral pleural effusions are present. Electronically Signed   By: David  [place]iland[place] M.D.   On: 11/05/2015 13:18   I have personally reviewed  and evaluated these images and lab results as part of my medical decision-making.   EKG Interpretation   Date/Time:  Tuesday November 05 2015 10:56:24 EDT Ventricular Rate:  104 PR Interval:     QRS Duration: 90 QT Interval:  387 QTC Calculation: 510 R Axis:   -23 Text Interpretation:  Sinus tachycardia Low voltage, extremity and  precordial leads Nonspecific T abnormalities, anterior leads Prolonged QT  interval No significant change since last tracing Confirmed by Isobella Ascher,  Orelia Brandstetter ([id number]) on 11/05/2015 12:39:30 PM      MDM  Patient was seen and evaluated in the resuscitation bay. Patient was mildly hypotensive but not far from her baseline. Results were concerning for pneumonia as well as possible UTI. Patient was started on broad-spectrum antibiotics for HCAP.  Patient was given 2x 500 mL fluid boluses. Her blood pressure normalized for what hers currently has been running.  Because blood pressure around patient's pain is lying and patient afebrile could sepsis was not initiated. Her elevated lactic acid and concern for infection were discussed with critical care who agreed at this time with treating her with some fluids but not the full 30 mL per KG since her blood pressure is around its normal. Laboratory studies also revealed an elevated troponin without any significant EKG changes. Cardiology was consult it who recommended trending the troponin. Case was discussed with the medicine resident on-call who agreed with admission. Patient was admitted to the stepdown unit under the care of their team. Final diagnoses:  HCAP (healthcare-associated pneumonia)  Generalized weakness  Elevated troponin    1. HCAP  2. Generalized Weakness 3. Elevated troponin    Leta Baptist, MD 11/05/15 1737

## 2015-11-05 NOTE — ED Notes (Signed)
Pt returned from xray

## 2015-11-06 ENCOUNTER — Inpatient Hospital Stay (HOSPITAL_COMMUNITY): Payer: Medicare Other

## 2015-11-06 ENCOUNTER — Telehealth: Payer: Self-pay | Admitting: Internal Medicine

## 2015-11-06 DIAGNOSIS — I319 Disease of pericardium, unspecified: Secondary | ICD-10-CM

## 2015-11-06 DIAGNOSIS — N3000 Acute cystitis without hematuria: Secondary | ICD-10-CM | POA: Insufficient documentation

## 2015-11-06 DIAGNOSIS — B9689 Other specified bacterial agents as the cause of diseases classified elsewhere: Secondary | ICD-10-CM

## 2015-11-06 DIAGNOSIS — N390 Urinary tract infection, site not specified: Principal | ICD-10-CM

## 2015-11-06 DIAGNOSIS — R627 Adult failure to thrive: Secondary | ICD-10-CM

## 2015-11-06 DIAGNOSIS — I959 Hypotension, unspecified: Secondary | ICD-10-CM

## 2015-11-06 DIAGNOSIS — E872 Acidosis: Secondary | ICD-10-CM

## 2015-11-06 LAB — CBC
HCT: 30.7 % — ABNORMAL LOW (ref 36.0–46.0)
HEMOGLOBIN: 10.1 g/dL — AB (ref 12.0–15.0)
MCH: 28.6 pg (ref 26.0–34.0)
MCHC: 32.9 g/dL (ref 30.0–36.0)
MCV: 87 fL (ref 78.0–100.0)
Platelets: 59 10*3/uL — ABNORMAL LOW (ref 150–400)
RBC: 3.53 MIL/uL — ABNORMAL LOW (ref 3.87–5.11)
RDW: 19.6 % — AB (ref 11.5–15.5)
WBC: 4.9 10*3/uL (ref 4.0–10.5)

## 2015-11-06 LAB — ECHOCARDIOGRAM COMPLETE
HEIGHTINCHES: 65 in
WEIGHTICAEL: 2271.62 [oz_av]

## 2015-11-06 LAB — RENAL FUNCTION PANEL
Albumin: 1.8 g/dL — ABNORMAL LOW (ref 3.5–5.0)
Anion gap: 7 (ref 5–15)
BUN: 11 mg/dL (ref 6–20)
CHLORIDE: 102 mmol/L (ref 101–111)
CO2: 27 mmol/L (ref 22–32)
Calcium: 7.7 mg/dL — ABNORMAL LOW (ref 8.9–10.3)
Creatinine, Ser: 3.53 mg/dL — ABNORMAL HIGH (ref 0.44–1.00)
GFR calc Af Amer: 13 mL/min — ABNORMAL LOW (ref >60)
GFR, EST NON AFRICAN AMERICAN: 11 mL/min — AB (ref >60)
GLUCOSE: 107 mg/dL — AB (ref 65–99)
POTASSIUM: 3.8 mmol/L (ref 3.5–5.1)
Phosphorus: 2.7 mg/dL (ref 2.5–4.6)
Sodium: 136 mmol/L (ref 135–145)

## 2015-11-06 LAB — LACTIC ACID, PLASMA: Lactic Acid, Venous: 1.9 mmol/L (ref 0.5–1.9)

## 2015-11-06 MED ORDER — DEXTROSE 5 % IV SOLN
1.0000 g | INTRAVENOUS | Status: DC
  Administered 2015-11-07 – 2015-11-10 (×4): 1 g via INTRAVENOUS
  Filled 2015-11-06 (×4): qty 10

## 2015-11-06 MED ORDER — DEXTROSE 5 % IV SOLN
1.0000 g | INTRAVENOUS | Status: DC
  Administered 2015-11-06: 1 g via INTRAVENOUS
  Filled 2015-11-06: qty 10

## 2015-11-06 MED ORDER — ALBUMIN HUMAN 25 % IV SOLN
INTRAVENOUS | Status: AC
  Administered 2015-11-06: 25 g via INTRAVENOUS
  Filled 2015-11-06: qty 100

## 2015-11-06 MED ORDER — CIPROFLOXACIN HCL 250 MG PO TABS
250.0000 mg | ORAL_TABLET | ORAL | Status: AC
  Administered 2015-11-06: 250 mg via ORAL
  Filled 2015-11-06: qty 1

## 2015-11-06 MED ORDER — ALBUMIN HUMAN 25 % IV SOLN
25.0000 g | Freq: Once | INTRAVENOUS | Status: AC
  Administered 2015-11-06: 25 g via INTRAVENOUS

## 2015-11-06 MED ORDER — LIDOCAINE HCL 2 % EX GEL
1.0000 "application " | Freq: Three times a day (TID) | CUTANEOUS | Status: DC
  Administered 2015-11-06 – 2015-11-10 (×10): 1 via TOPICAL
  Filled 2015-11-06 (×5): qty 5

## 2015-11-06 NOTE — Progress Notes (Signed)
*  PRELIMINARY RESULTS* Echocardiogram 2D Echocardiogram has been performed.  [name], [name] 11/06/2015, 4:51 PM

## 2015-11-06 NOTE — Evaluation (Signed)
Physical Therapy Evaluation Patient Details Name: [name] MRN: [id number] DOB: 03/28/1928 Today's Date: 11/06/2015   History of Present Illness  [name] is an 82 year old woman with a history of end-stage renal disease requiring hemodialysis, hypertension, hyperlipidemia, and paroxysmal atrial fibrillation, HTN, CHF, Gout, and bil LE edema who presented with weakness, lactic acidemia and hypotension after recent d/c home following 2 day admission for L rib fracture sustained after falling out of bed.  She had great difficulty with getting out to dialysis and was referred back to the ED.   Clinical Impression  Patient presents with decreased independence with mobility due to pain and lethargy as well as deficits listed in PT problem list below.  Feel patient will benefit from skilled PT in the acute setting to address issues and allow d/c home following SNF level rehab.     Follow Up Recommendations SNF    Equipment Recommendations  None recommended by PT    Recommendations for Other Services       Precautions / Restrictions Precautions Precautions: Fall      Mobility  Bed Mobility Overal bed mobility: Needs Assistance       Supine to sit: HOB elevated;Mod assist Sit to supine: +2 for physical assistance;Mod assist   General bed mobility comments: assisted up to EOB and to chair, then had to help back to bed due to pt to go to dialysis; increased time, but pt able to assist with lifting trunk and bringing legs off edge of bed; back to bed assist for legs and trunk, but pt assist to move legs and scoot once supine  Transfers Overall transfer level: Needs assistance Equipment used: 2 person hand held assist Transfers: Stand Pivot Transfers Sit to Stand: Mod assist Stand pivot transfers: Mod assist;+2 safety/equipment;+2 physical assistance       General transfer comment: assist up from EOB and pivot to chair with +1 A, back out of chair to bed to get to  dialysis +2 A due to low seat and weakness  Ambulation/Gait             General Gait Details: NT due to weakness, pain and lethargy  Stairs            Wheelchair Mobility    Modified Rankin (Stroke Patients Only)       Balance Overall balance assessment: Needs assistance Sitting-balance support: Feet supported Sitting balance-Leahy Scale: Fair Sitting balance - Comments: sits at EOB with supervision, but weakness evident in trunk     Standing balance-Leahy Scale: Poor Standing balance comment: UE support and min/mod A for balance with standing pivot transfer                             Pertinent Vitals/Pain Faces Pain Scale: Hurts even more Pain Location: L side and generalized soreness Pain Descriptors / Indicators: Sore;Grimacing;Guarding Pain Intervention(s): Monitored during session;Repositioned;Limited activity within patient's tolerance    Home Living Family/patient expects to be discharged to:: Skilled nursing facility Living Arrangements: Alone Available Help at Discharge: Family;Available PRN/intermittently;Personal care attendant Type of Home: House Home Access: Stairs to enter Entrance Stairs-Rails: Right Entrance Stairs-Number of Steps: 3   Home Equipment: Emergency planning/management officerhower seat;Walker - 2 wheels;Cane - single point      Prior Function        ADL's / Homemaking Assistance Needed: PCA comes 5days/week, mornings on non-dialysis days, evenings on dialysis days (x3 days/week). PCA assist with bathing, dressing and homemaking  tasks.         Hand Dominance        Extremity/Trunk Assessment   Upper Extremity Assessment: RUE deficits/detail;LUE deficits/detail RUE Deficits / Details: lethargic so did not lift arms to command, weakness evident even when pt participating     LUE Deficits / Details: lethargic so did not lift arms to command, weakness evident even when pt participating   Lower Extremity Assessment: Generalized weakness       Cervical / Trunk Assessment: Kyphotic  Communication   Communication: HOH  Cognition Arousal/Alertness: Lethargic Behavior During Therapy: Flat affect Overall Cognitive Status: Impaired/Different from baseline Area of Impairment: Memory;Following commands;Safety/judgement;Problem solving     Memory: Decreased short-term memory Following Commands: Follows one step commands with increased time;Follows one step commands inconsistently Safety/Judgement: Decreased awareness of deficits   Problem Solving: Slow processing;Decreased initiation;Requires verbal cues;Requires tactile cues General Comments: confused and lethargic needing increased time with repeated instructions    General Comments      Exercises General Exercises - Upper Extremity Shoulder Flexion: AAROM;5 reps;Both;Supine General Exercises - Lower Extremity Ankle Circles/Pumps: AROM;Both;Supine;5 reps Heel Slides: AAROM;Both;5 reps;Supine      Assessment/Plan    PT Assessment Patient needs continued PT services  PT Diagnosis Acute pain;Generalized weakness;Difficulty walking   PT Problem List Decreased strength;Decreased activity tolerance;Decreased balance;Decreased mobility;Pain;Decreased knowledge of use of DME;Decreased knowledge of precautions  PT Treatment Interventions DME instruction;Balance training;Gait training;Functional mobility training;Patient/family education;Therapeutic activities;Therapeutic exercise   PT Goals (Current goals can be found in the Care Plan section) Acute Rehab PT Goals Patient Stated Goal: None stated PT Goal Formulation: Patient unable to participate in goal setting Time For Goal Achievement: 11/13/15 Potential to Achieve Goals: Fair    Frequency Min 3X/week   Barriers to discharge        Co-evaluation               End of Session Equipment Utilized During Treatment: Gait belt Activity Tolerance: Patient limited by fatigue;Patient limited by lethargy;No increased  pain Patient left: in bed;with call bell/phone within reach           Time: 1138-1220 PT Time Calculation (min) (ACUTE ONLY): 42 min   Charges:   PT Evaluation $PT Eval Moderate Complexity: 1 Procedure PT Treatments $Therapeutic Activity: 23-37 mins   PT G Codes:        Elray Mcgregor 11/23/15, 2:02 PM  Sheran Lawless, PT [phone] 11-23-2015

## 2015-11-06 NOTE — H&P (Signed)
Internal Medicine Attending Admission Note Date: 11/06/2015  Patient name: [name] Medical record number: [id number] Date of birth: May 17, 1927 Age: 82 y.o. Gender: female  I saw and evaluated the patient. I reviewed the resident's note and I agree with the resident's findings and plan as documented in the resident's note.  Chief Complaint(s): Generalized weakness since discharge.  History - key components related to admission:  [name] is an 82 year old woman with a history of end-stage renal disease requiring hemodialysis, hypertension, hyperlipidemia, and paroxysmal atrial fibrillation who presents with a three-day history of generalized weakness. She was recently discharged from the internal medicine teaching service after sustaining 3 rib fractures from a fall out of bed. She was treated with Tylenol 3 for pain and discharged home the following day. There seems to been some confusion regarding the appropriate Tylenol 3 dose but this was stopped. Since discharge the patient has been somnolent with generalized weakness. It took 5 family members to get her to the car for her dialysis treatment. When she got to dialysis they noted how weak she was and referred them to the emergency department. She noted nausea and vomiting as well as generalized pain particularly in the flanks bilaterally as well as the lower abdomen. She denied any shortness of breath or specific chest pain. She was readmitted to the internal medicine teaching service for further evaluation when she was noted to have blood pressures lower than usual and a lactic acidemia.  Physical Exam - key components related to admission:  Filed Vitals:   11/06/15 0900 11/06/15 1000 11/06/15 1100 11/06/15 1131  BP: 99/59 99/65 105/59 102/56  Pulse:  87  99  Temp:    97.9 F (36.6 C)  TempSrc:    Oral  Resp: Height:      Weight:      SpO2: 93% 93%  92%   Gen.: Well-developed, well-nourished, frail elderly  woman lying in bed in mild distress when moved. Lungs: Clear to auscultation bilaterally anteriorly. Heart: Regular rate and rhythm with a 2/6 systolic murmur. Abdomen: Soft, lower abdominal tenderness centrally in the suprapubic area without guarding or rebound. GU: Superficial edema of the right labia majora with mild erythema. No other abnormalities were noted. Back: Without lesions.  Lab results:  Basic Metabolic Panel:  Recent Labs  14/78/29 1210  NA 134*  K 5.1  CL 98*  CO2 26  GLUCOSE 98  BUN 18  CREATININE 6.31*  CALCIUM 8.2*   Liver Function Tests:  Recent Labs  11/05/15 1210  AST 41  ALT 24  ALKPHOS 156*  BILITOT 1.4*  PROT 4.5*  ALBUMIN 1.6*   CBC:  Recent Labs  11/05/15 1210 11/06/15 0232  WBC 6.8 4.9  NEUTROABS 5.9  --   HGB 11.0* 10.1*  HCT 33.7* 30.7*  MCV 89.2 87.0  PLT 66* 59*   Cardiac Enzymes:  Recent Labs  11/05/15 1210 11/05/15 1844  TROPONINI 0.35* 0.42*   Urinalysis:  Cloudy, amber, specific gravity 1.021, pH 5.5, hemoglobin moderate, bilirubin moderate, ketones 15, protein 30, nitrite negative, leukocytes large, too numerous to count white blood cells per high-power field, 0-5 red blood cells per high-power field  Misc. Labs:  Lactic acid 4.63 -> 3.97 -> 5.46 -> 4.8 -> 4.4 Blood culture 2 pending  Imaging results:  Dg Chest 2 View  11/05/2015  CLINICAL DATA:  Syncope and hypotension for the past day ; chronic renal failure on dialysis, atrial fibrillation. EXAM: CHEST  2 VIEW COMPARISON:  Portable chest x-ray of November 01, 2015 FINDINGS: The lungs are less well inflated today. There are R small bilateral pleural effusions greater on the left. The cardiac silhouette is enlarged. The pulmonary vascularity is engorged. The interstitial markings are increased in the left mid lung in the inferior aspect of the right upper lobe. IMPRESSION: Findings consistent with mild pulmonary interstitial edema likely secondary to CHF. Increased  interstitial density focally in the left lung and right upper lobe may reflect early pneumonia or early alveolar edema. Small bilateral pleural effusions are present. Electronically Signed   By: David  [place] M.D.   On: 11/05/2015 13:18   Other results:  EKG: Normal sinus tachycardia at 104 bpm, left axis deviation, prolonged QTC, no significant Q waves, no LVH by voltage, low voltage throughout, good R wave progression, no ST segment changes, T wave inversions in V1 through V3, no significant changes from the previous ECG on 01/06/2013.  Assessment & Plan by Problem:  [name] is an 82 year old woman with a history of end-stage renal disease requiring hemodialysis, hypertension, hyperlipidemia, and paroxysmal atrial fibrillation who presents with a three-day history of generalized weakness. She was found to have blood pressures lower than her baseline systolics in the 90s as well as a lactic acidemia. Chest x-ray revealed questionable infiltrates but the study was of very poor quality. She is completely without pulmonary complaints and denies any shortness of breath. Therefore I am not suspecting pneumonia at this moment. Instead, I am concerned about a urinary tract infection and possible urobacteremia. She continues to make urine, has pyuria, and has an exam with tenderness in the suprapubic area all consistent with significant urinary tract infection. The differential also includes poor perfusion related to heart failure or hypovolemia. With regards to heart failure she would be of the cold and wet phenotype if this were the case. Again I would suspect she would have some pulmonary symptoms such as dyspnea of which she denies. Hypovolemia leading to hypotension and poor perfusion does remain in the differential although her blood pressure has returned to baseline with some IV fluids and she has undergone dialysis adjusting to a new predicted dry weight. Overall, it sounds like she has had some  failure to thrive at home for quite some time with her multiple falls and generalized debility. Thus, I agree with the subspecialists who suggest a palliative care discussion may be important at this point given her overall trajectory.  1) Potential urobacteremia with sepsis: Her blood pressure has returned to her baseline with IV fluids. We are starting ceftriaxone for her presumed urinary tract infection as the etiology. Once she stabilizes we will repeat the chest x-ray, hopefully with better technique, to really define whether or not there are pulmonary infiltrates, but my suspicion is low. Cardiology is ordering an echocardiogram to rule out a pericardial effusion given the low pressures and low voltage on ECG in the setting of renal failure. This is reasonable to do and will also give [place] some idea of whether or not she has significant heart failure, although my suspicion is low.  2) Progressive failure to thrive: We will begin the discussion of goals of care with the patient and her family as she improves. We also need to have a discussion as to appropriate placement and whether or not she may benefit from some physical rehabilitation in a skilled nursing facility before returning home or to her family.  3) Disposition: Placement is pending her response to  the antibiotics as well as the results of her goals of care discussion as to what she and her family are interested in moving forward.

## 2015-11-06 NOTE — Telephone Encounter (Signed)
Spoke with patient's daughter, [name]. [name] is in agreement to meet with Palliative care. I expressed that her mother has been steadily deconditioning over the last 6 months based on my prior conversations with the patient's granddaughter and the patient's dialysis center. [name] agreed that her mother has "not been able to walk" and "not taking medications correctly." Family seems frustrated that patient refuses to go to SNF or even live with other family members. [name] states she would like for her granddaughter and her sister to be present for the discussion. I also spoke with Dr. Arlean HoppingSchertz and he would like to present for this discussion. I will reach out to the Palliative care team to see if they can meet with the patient and family tomorrow.   Kaitlyn Numberarly Brenee Gajda, MD, MPH Internal Medicine Resident, PGY-III Pager: [phone]

## 2015-11-06 NOTE — Progress Notes (Signed)
Pt still c/o pain all over.  Almost in tears.  Notified 1st pager resident at this time.  Also, pt only has one iv that is in axilla area, very positional and not reliable to last.  Phlebotomy has been unable to draw labs after multiple attempts by multiple phlebotomist/nurses.  Notified team that pt will more than likely need another form of iv access (picc line).  States, "will talk it over and decide."

## 2015-11-06 NOTE — Progress Notes (Signed)
Hemodialysis- Pt tolerated treatment well. Unable to meet UF goal d/t low bp with orders to keep systolic >80. Given albumin for bp support per orders. Total UF 733cc. Report called to primary RN on 2C. Pt currently has no complaints. Alert and oriented x2. Will send back to room.

## 2015-11-06 NOTE — Progress Notes (Signed)
While team rounding, pt c/o severe pain "all over".  Made sure team was aware of pt complaint at this time.

## 2015-11-06 NOTE — Progress Notes (Signed)
Internal Medicine Attending  Date: 11/06/2015  Patient name: Kaitlyn PodLillian P Hevia Medical record number: [id number] Date of birth: 05/10/1927 Age: 82 y.o. Gender: female  I saw and evaluated the patient. I reviewed the resident's note by Dr. Glenard Haringatliff-Hoffman and I agree with the resident's findings and plans as documented in her progress note.  Please see my H&P dated 11/06/2015 for the specifics of my evaluation, assessment, and plan from earlier today.

## 2015-11-06 NOTE — Progress Notes (Signed)
[facility] Progress Note   Subjective: restless, can't understand her c/o's  Filed Vitals:   11/06/15 0900 11/06/15 1000 11/06/15 1100 11/06/15 1131  BP: 99/59 99/65 105/59 102/56  Pulse:  87  99  Temp:    97.9 F (36.6 C)  TempSrc:    Oral  Resp: 17 15 17 14   Height:      Weight:      SpO2: 93% 93%  92%    Inpatient medications: . acetaminophen  1,000 mg Oral Q8H  . allopurinol  100 mg Oral QHS  . aspirin EC  81 mg Oral Daily  . [START ON 11/07/2015] cefTRIAXone (ROCEPHIN)  IV  1 g Intravenous Q24H  . cinacalcet  30 mg Oral QHS  . feeding supplement (GLUCERNA SHAKE)  237 mL Oral QHS  . heparin  5,000 Units Subcutaneous Q8H  . lidocaine  1 application Topical TID  . loratadine  10 mg Oral QHS  . midodrine  10 mg Oral Q supper  . sevelamer carbonate  800 mg Oral Q supper     sodium chloride, sodium chloride, midodrine  Exam: General: Elderly AAF NAD but unable to assist in moving to stretcher Head: NCAT sclera not icteric MMM Neck: Supple.  Lungs: dim BS anteriorly Breathing is unlabored. Heart: RRR with S1 S2.  Abdomen: soft NT + BS Lower extremities: 2- 3 + pitting LE/UE edema blister on left heel Neuro: alert, HOH Dialysis Access: left lower AVGG + bruit  Dialysis Orders: [facility] MWF 3.5h   F160  64kg  (just ^ from 62)  3K/2.25 bath  P5  L forearm AVG  Hep 1000 Last Mircera 100 6/28 no VDRA  Assessment/Plan: 1. FTT - had chronic hypotension prior to admission; mobility issues worsening with recent rib fractures - poor prognosis 2. Possible PNA - IV Vanc and Maxipime started 3. Recent rib fracture due to recent fall- limited mobility and recent hospitalization increase risk for PNA 4. + troponin - cards has seen and recommends pall care 5. ESRD - MWF hd, mobility decline will limit dialysis options 6. Chronic hypotension - on midodrine 7. Vol overload - sig edema all ext 8. Anemia - hgb 11 stable - hold on redose of ESA unless hgb trends  down 9. Metabolic bone disease - Ca 8.2/ no VDRA - follow labs 10. Nutrition - alb 1.6 - needs renal diet/supplements/vitamins/prostat 11. Blister left heal - need to float heels - defer to primary   Plan - HD today   Vinson Moselleob Shishir Krantz MD [facility] pager [phone]    cell [phone] 11/06/2015, 12:59 PM    Recent Labs Lab 11/01/15 0746 11/02/15 0538 11/05/15 1210  NA 138 138 134*  K 4.2 4.2 5.1  CL 101 102 98*  CO2 27 31 26   GLUCOSE 101* 78 98  BUN 7 <5* 18  CREATININE 4.37* 2.72* 6.31*  CALCIUM 8.7* 7.8* 8.2*  PHOS  --  2.3*  --     Recent Labs Lab 11/02/15 0538 11/05/15 1210  AST  --  41  ALT  --  24  ALKPHOS  --  156*  BILITOT  --  1.4*  PROT  --  4.5*  ALBUMIN 1.5* 1.6*    Recent Labs Lab 11/01/15 0746  11/03/15 0929 11/05/15 1210 11/06/15 0232  WBC 10.4  < > 7.1 6.8 4.9  NEUTROABS 9.2*  --   --  5.9  --   HGB 11.0*  < > 11.3* 11.0* 10.1*  HCT 34.7*  < >  34.1* 33.7* 30.7*  MCV 92.3  < > 88.3 89.2 87.0  PLT 61*  < > 54* 66* 59*  < > = values in this interval not displayed. Iron/TIBC/Ferritin/ %Sat    Component Value Date/Time   IRON 49 12/28/2008 0525   TIBC 172* 12/28/2008 0525   FERRITIN 201 12/27/2008 0549   IRONPCTSAT 28 12/28/2008 0525

## 2015-11-06 NOTE — Progress Notes (Signed)
   Subjective: [name] was evaluated this morning on rounds.  She was laying in bed complaining of pain in her suprapubic region and pain in her back.  Interview was difficult to obtain as patient was somnolent.  She denied SOB or chest pain.         Objective: Vital signs in last 24 hours: Filed Vitals:   11/06/15 1100 11/06/15 1131 11/06/15 1200 11/06/15 1300  BP: 105/59 102/56  97/66  Pulse:  99    Temp:  97.9 F (36.6 C)    TempSrc:  Oral    Resp: 17 14 20 17   Height:      Weight:      SpO2:  92%     Physical Exam Physical Exam  HENT:  Head: Normocephalic and atraumatic.  Cardiovascular:  Irregularly Irregular  Pulmonary/Chest: Effort normal and breath sounds normal. No respiratory distress.  Abdominal: Soft. She exhibits no distension.  Genitourinary:  Right labia majora swelling  Musculoskeletal:  B/l lower extremity swelling  Tenderness to left lateral and anterior chest wall.  3 rib fractures  Skin: Skin is warm and dry.     Assessment/Plan:  Lactic acidosis Patients lactic acid continues to be elevated, currently at 4.4.  Concern for infection is suspected.  She is a frail elderly woman with decline in alertness.  Possible sources for infection include pulmonary and urinary.  Chest X-ray was of poor quality and difficult to ascertain if a pneumonia is present.  If patient is able to move a repeat chest x-ray with a different view may be done.  Suspicion for pneumonia is low based on clinical presentation.  She has not had a worsening cough with sputum production and does not complain of SOB or pleuritic chest pain.  Higher up on the differential is a urinary tract infection.  Patient complains of suprapubic pain.  She will be empirically treated with Ceftriaxone.  Lower on the differential is increased lactic acid due to poor perfusion.  This could be attributed to either hypovolemia or heart failure.  Patient has had poor intake and was given 1L of NS in the ED.  Her blood pressures are currently at baseline and live in the 90s/60s.  An echo has been ordered to reassess heart failure. - Patient does not have IV access and will be on PO antibiotics, M/W/F 250mg  Cipro once daily - Echo pending - Dialysis today - Palliative consult, family was notified to set up meeting for patient care goals  ESRD Patient went to dialysis today but was not able to tolerate and could not finish entire treatment. - Consult to palliative care, Dr. Arlean HoppingSchertz expressed desire to be present in palliative meeting.  There is concern that she may not qualify for future HD.              Dispo: Anticipated discharge in approximately 2 day(s).   LOS: 1 day   Kaitlyn PhenesJessica Gonzales Kaitlyn Cochrane, DO 11/06/2015, 1:35 PM Pager: [phone]

## 2015-11-06 NOTE — Progress Notes (Signed)
The patient seemed to experience a rhythm change this morning.  EKG obtained and filed in chart.  Also, patient with overdue labs.  HD nurse obtained earlier labs; phlebotomist to return.  The attending has been updated.

## 2015-11-06 NOTE — Progress Notes (Signed)
OT Cancellation Note  Patient Details Name: [name] MRN: [id number] DOB: 07/04/1927   Cancelled Treatment:    Reason Eval/Treat Not Completed: Patient at procedure or test/ unavailable.  Pt in HD, will reattempt.   Janilah Hojnacki [place]onarpe, OTR/L [phone]   [name], [name] 11/06/2015, 2:16 PM

## 2015-11-06 NOTE — Progress Notes (Deleted)
[facility] Progress Note   Subjective: restless, can't understand her c/o's  Filed Vitals:   11/06/15 0900 11/06/15 1000 11/06/15 1100 11/06/15 1131  BP: 99/59 99/65 105/59 102/56  Pulse:  87  99  Temp:    97.9 F (36.6 C)  TempSrc:    Oral  Resp: Height:      Weight:      SpO2: 93% 93%  92%    Inpatient medications: . acetaminophen  1,000 mg Oral Q8H  . allopurinol  100 mg Oral QHS  . aspirin EC  81 mg Oral Daily  . [START ON 11/07/2015] cefTRIAXone (ROCEPHIN)  IV  1 g Intravenous Q24H  . cinacalcet  30 mg Oral QHS  . feeding supplement (GLUCERNA SHAKE)  237 mL Oral QHS  . heparin  5,000 Units Subcutaneous Q8H  . lidocaine  1 application Topical TID  . loratadine  10 mg Oral QHS  . midodrine  10 mg Oral Q supper  . sevelamer carbonate  800 mg Oral Q supper     sodium chloride, sodium chloride, alteplase, heparin, heparin, lidocaine (PF), lidocaine-prilocaine, midodrine, pentafluoroprop-tetrafluoroeth  Exam: General: Elderly AAF NAD but unable to assist in moving to stretcher Head: NCAT sclera not icteric MMM Neck: Supple.  Lungs: dim BS anteriorly Breathing is unlabored. Heart: RRR with S1 S2.  Abdomen: soft NT + BS Lower extremities: 2- 3 + pitting LE/UE edema blister on left heel Neuro: alert, HOH Dialysis Access: left lower AVGG + bruit  Dialysis Orders: [facility] MWF 3.5h   F160  64kg  (just ^ from 62)  3K/2.25 bath  P5  L forearm AVG  Hep 1000 Last Mircera 100 6/28 no VDRA  Assessment/Plan: 1. FTT - had chronic hypotension prior to admission; mobility issues worsening with recent rib fractures - poor prognosis 2. Possible PNA - IV Vanc and Maxipime started 3. Recent rib fracture due to recent fall- limited mobility and recent hospitalization increase risk for PNA 4. + troponin - cards has seen and recommends pall care 5. ESRD - MWF hd, mobility decline will limit dialysis options 6. Chronic hypotension - on midodrine 7. Vol  overload - sig edema all ext 8. Anemia - hgb 11 stable - hold on redose of ESA unless hgb trends down 9. Metabolic bone disease - Ca 8.2/ no VDRA - follow labs 10. Nutrition - alb 1.6 - needs renal diet/supplements/vitamins/prostat 11. Blister left heal - need to float heels - defer to primary   Plan - HD today   Vinson Moselle MD [facility] pager [phone]    cell [phone] 11/06/2015, 11:50 AM    Recent Labs Lab 11/01/15 0746 11/02/15 0538 11/05/15 1210  NA 138 138 134*  K 4.2 4.2 5.1  CL 101 102 98*  CO2 GLUCOSE 101* 78 98  BUN 7 <5* 18  CREATININE 4.37* 2.72* 6.31*  CALCIUM 8.7* 7.8* 8.2*  PHOS  --  2.3*  --     Recent Labs Lab 11/02/15 0538 11/05/15 1210  AST  --  41  ALT  --  24  ALKPHOS  --  156*  BILITOT  --  1.4*  PROT  --  4.5*  ALBUMIN 1.5* 1.6*    Recent Labs Lab 11/01/15 0746  11/03/15 0929 11/05/15 1210 11/06/15 0232  WBC 10.4  < > 7.1 6.8 4.9  NEUTROABS 9.2*  --   --  5.9  --   HGB 11.0*  < >  11.3* 11.0* 10.1*  HCT 34.7*  < > 34.1* 33.7* 30.7*  MCV 92.3  < > 88.3 89.2 87.0  PLT 61*  < > 54* 66* 59*  < > = values in this interval not displayed. Iron/TIBC/Ferritin/ %Sat    Component Value Date/Time   IRON 49 12/28/2008 0525   TIBC 172* 12/28/2008 0525   FERRITIN 201 12/27/2008 0549   IRONPCTSAT 28 12/28/2008 0525

## 2015-11-06 NOTE — Progress Notes (Signed)
       Patient Name: [name] Date of Encounter: 11/06/2015    SUBJECTIVE: Left chest pain with palpable tenderness.  TELEMETRY:  Sinus tach  Filed Vitals:   11/06/15 0226 11/06/15 0300 11/06/15 0305 11/06/15 0800  BP: 87/68 93/53  88/59  Pulse: 101   97  Temp: 97.1 F (36.2 C)  97.7 F (36.5 C) 97.6 F (36.4 C)  TempSrc: Axillary  Oral Oral  Resp: 15  13 12   Height:      Weight: 149 lb 14.6 oz (68 kg)     SpO2: 98%  100% 95%    Intake/Output Summary (Last 24 hours) at 11/06/15 0916 Last data filed at 11/06/15 0218  Gross per 24 hour  Intake   1200 ml  Output    733 ml  Net    467 ml   LABS: Basic Metabolic Panel:  Recent Labs  40/98/1106/03/13 1210  NA 134*  K 5.1  CL 98*  CO2 26  GLUCOSE 98  BUN 18  CREATININE 6.31*  CALCIUM 8.2*   CBC:  Recent Labs  11/05/15 1210 11/06/15 0232  WBC 6.8 4.9  NEUTROABS 5.9  --   HGB 11.0* 10.1*  HCT 33.7* 30.7*  MCV 89.2 87.0  PLT 66* 59*   Cardiac Enzymes:  Recent Labs  11/05/15 1210 11/05/15 1844  TROPONINI 0.35* 0.42*    ECG: Low voltage with ST and non-specific ST abnormality.  Radiology/Studies:  CXR 11/05/15 IMPRESSION: Findings consistent with mild pulmonary interstitial edema likely secondary to CHF. Increased interstitial density focally in the left lung and right upper lobe may reflect early pneumonia or early alveolar edema. Small bilateral pleural effusions are present.   Physical Exam: Blood pressure 88/59, pulse 97, temperature 97.6 F (36.4 C), temperature source Oral, resp. rate 12, height 5\' 5"  (1.651 m), weight 149 lb 14.6 oz (68 kg), SpO2 95 %. Weight change:   Wt Readings from Last 3 Encounters:  11/06/15 149 lb 14.6 oz (68 kg)  11/01/15 126 lb 8.7 oz (57.4 kg)  01/06/13 151 lb 14.4 oz (68.9 kg)   No obvious neck vein distension Chest is clear anteriorly. Cardiac tones are distant.  ASSESSMENT:  1. Flat cardiac markers exclude ACS and likely represent demand ischemia  in setting of hypotension. 2. Low voltage on ECG and hypotension in ESRD patient raises concern for pericardial effusion  Plan:  1. Overall prognosis seems poor and would concur with recommendation for Palliative Care consult and family discussions of end of life prep.  2. Echocardiogram to r/o pericardial effusion  3. No ischemic w/o  4. Assuming no significant effusion, we will sign off.  Signed, Lyn RecordsHenry W Smith III 11/06/2015, 9:16 AM

## 2015-11-07 DIAGNOSIS — Z7189 Other specified counseling: Secondary | ICD-10-CM

## 2015-11-07 DIAGNOSIS — I953 Hypotension of hemodialysis: Secondary | ICD-10-CM

## 2015-11-07 DIAGNOSIS — N3000 Acute cystitis without hematuria: Secondary | ICD-10-CM

## 2015-11-07 DIAGNOSIS — Z515 Encounter for palliative care: Secondary | ICD-10-CM

## 2015-11-07 NOTE — Consult Note (Signed)
Consultation Note Date: 11/07/2015   Patient Name: [name]  DOB: 10/26/1927  MRN: [id number]  Age / Sex: 82 y.o., female  PCP: No primary care provider on file. Referring Physician: Doneen PoissonLawrence Klima, MD  Reason for Consultation: Establishing goals of care  HPI/Patient Profile: 82 y.o. female  admitted on 11/05/2015   Clinical Assessment and Goals of Care: [name] is a 82 year old lady with a past medical history significant for end-stage renal disease requiring hemodialysis, history of paroxysmal atrial fibrillation, hypertension, congestive heart failure, gout. The patient has had worsening bilateral lower extremity edema. The patient was hospitalized recently following a fall and left-sided rib fractures. She has had low blood pressures and has not been able to tolerate dialysis sessions. She has had a recent acute decline and has largely become bedbound. It is reported that she was having difficulty getting to dialysis sessions.   Agent is now admitted to the teaching service. She is guardian mitral trying for chronic hypotension. She is being followed by nephrology in this hospitalization. She is undergoing dialysis attempts. She is placed on pain regimen for her recent rib fractures. In light of the patient having mobility issues, chronic hypotension, fall recent rib fractures, underlying end-stage renal disease, a palliative consultation has been requested for goals of care discussions.  Patient is sitting up in a chair eating her lunch. It is reported that she is quite awake and alert today. Patient's daughter [name] primary caregiver is present at the bedside. Patient's brother is also present at the bedside. Introduced myself and palliative care as follows: Palliative medicine is specialized medical care for people living with serious illness. It focuses on providing relief from the symptoms  and stress of a serious illness. The goal is to improve quality of life for both the patient and the family.  Patient is hard of hearing but is decisional enough to participate in the discussion. Dr. Rae LipsScherttz from nephrology service also attended the family meeting. Patient has a living will, patient has completed healthcare power of attorney paperwork which was not available for my review at the time of the initial consultation. Patient however states that she elects her daughter [name] present at the bedside to be her designated healthcare power of attorney agent. Patient states she wishes to walk again. She is agreeable to pursuing skilled nursing facility rehabilitation attempt. Daughter states she is already in talks with social worker here at the hospital for finding a facility [place]  CODE STATUS discussions undertaken. Patient wishes to be full code. She wishes to undergo the full scope of what resuscitative attempt. Goals are not palliative/comfort oriented. Additionally, goals are for dialysis to continue. Validated patient's goals/wishes for her health, offered active listening and supportive care. See recommendations below. Thank you for the consult. Palliative care will continue to peripherally follow along, monitor patient's disease trajectory in this hospitalization.  HCPOA  Daughter [name] at [phone]  SUMMARY OF RECOMMENDATIONS    Full code Continuation of dialysis Skilled nursing facility for rehabilitation  towards discharge After rehabilitation attempt, patient wishes to transition to her granddaughter's home she lives [name]. Goals are not palliative approach at this time. This has been discussed extensively. Patient and family state that they recognize the patient has serious underlying comorbidities and has had failure to thrive recently. Patient and family state that the patient is a very independent, functional lady. They  wish to continue with full code, concentrated efforts on rehabilitation and therapy at this time. Continue current pain management regimen. Code Status/Advance Care Planning:  Full code    Symptom Management:    Continue current measures. Agree with current scope of pain management. Avoid opioids if at all possible.  Palliative Prophylaxis:   Bowel Regimen  Additional Recommendations (Limitations, Scope, Preferences):  Full Scope Treatment  Psycho-social/Spiritual:   Desire for further Chaplaincy support:no  Additional Recommendations: Caregiving  Support/Resources  Prognosis:   Unable to determine  Discharge Planning: Skilled Nursing Facility for rehab with Palliative care service follow-up      Primary Diagnoses: Present on Admission:  . AF (paroxysmal atrial fibrillation) (HCC) . Essential hypertension . Thrombocytopenia (HCC) . HYPERLIPIDEMIA, MIXED . Left rib fracture . Hypotension . Lactic acidosis  I have reviewed the medical record, interviewed the patient and family, and examined the patient. The following aspects are pertinent.  Past Medical History  Diagnosis Date  . Chest pain 07/07/10  . Hypertension   . Edema 07/24/10    of ankles  . Generalized headaches 03/12  . Lightheadedness 07/24/10  . Reflux   . Fatigue   . Arthritis   . Gout   . Hyperglycemia     Borderline diabetes  . Peptic ulcer disease   . AF (paroxysmal atrial fibrillation) (HCC)     Used to be on Warfarin in the past but was taken off due to falls, memory problems.  . CHF (congestive heart failure) (HCC)     30 years ago  . GERD (gastroesophageal reflux disease)   . Diabetes mellitus without complication (HCC)     borderline    . End stage renal disease on dialysis [facility]) since 2010    related to Vioxx, HD T-TH-SAT   Social History   Social History  . Marital Status: Widowed    Spouse Name: N/A  . Number of Children: N/A  . Years of Education: N/A   Social  History Main Topics  . Smoking status: Former Smoker -- 2 years    Types: Cigarettes    Quit date: 04/28/1979  . Smokeless tobacco: Never Used     Comment: rare - 1-2x/week, quit 30 yrs ago  . Alcohol Use: No  . Drug Use: No  . Sexual Activity: Not Asked   Other Topics Concern  . None   Social History Narrative   Lives at home by herself, has home health services       Family History  Problem Relation Age of Onset  . Cancer Father 47    throat cancer  . Other Mother     mother died from burns  . Kidney disease Sister   . CAD Neg Hx   . Diabetes Sister   . Diabetes Brother   . Diabetes Mother    Scheduled Meds: . acetaminophen  1,000 mg Oral Q8H  . allopurinol  100 mg Oral QHS  . aspirin EC  81 mg Oral Daily  . cefTRIAXone (ROCEPHIN)  IV  1 g Intravenous Q24H  . cinacalcet  30 mg Oral QHS  .  feeding supplement (GLUCERNA SHAKE)  237 mL Oral QHS  . heparin  5,000 Units Subcutaneous Q8H  . lidocaine  1 application Topical TID  . loratadine  10 mg Oral QHS  . midodrine  10 mg Oral Q supper  . sevelamer carbonate  800 mg Oral Q supper   Continuous Infusions:  PRN Meds:.midodrine Medications Prior to Admission:  Prior to Admission medications   Medication Sig Start Date End Date Taking? Authorizing Provider  acetaminophen (TYLENOL) 500 MG tablet Take 2 tablets (1,000 mg total) by mouth every 8 (eight) hours. 11/03/15  Yes Carly Arlyce Harman, MD  allopurinol (ZYLOPRIM) 100 MG tablet Take 100 mg by mouth at bedtime.    Yes Historical Provider, MD  aspirin EC 81 MG tablet Take 81 mg by mouth daily.   Yes Historical Provider, MD  cinacalcet (SENSIPAR) 30 MG tablet Take 30 mg by mouth at bedtime.   Yes Historical Provider, MD  clopidogrel (PLAVIX) 75 MG tablet Take 75 mg by mouth daily.    Yes Historical Provider, MD  GLUCERNA (GLUCERNA) LIQD Take 237 mLs by mouth at bedtime.   Yes Historical Provider, MD  loratadine (CLARITIN) 10 MG tablet Take 10 mg by mouth at bedtime.   Yes  Historical Provider, MD  midodrine (PROAMATINE) 10 MG tablet Take 10 mg by mouth See admin instructions. Take 1 tablet (10 mg) by mouth daily at bedtime, may take an additional tablet prior to dialysis as needed for low blood pressure   Yes Historical Provider, MD  multivitamin (RENA-VIT) TABS tablet Take 1 tablet by mouth at bedtime.   Yes Historical Provider, MD  pantoprazole (PROTONIX) 40 MG tablet Take 40 mg by mouth at bedtime.   Yes Historical Provider, MD  pyridOXINE (VITAMIN B-6) 100 MG tablet Take 100 mg by mouth at bedtime.    Yes Historical Provider, MD  sevelamer (RENVELA) 800 MG tablet Take 800 mg by mouth at bedtime.    Yes Historical Provider, MD  thiamine (VITAMIN B-1) 100 MG tablet Take 100 mg by mouth at bedtime.    Yes Historical Provider, MD  Lidocaine 2 % GEL Apply 1 application topically 4 (four) times daily as needed. Patient not taking: Reported on 11/05/2015 11/03/15   Su Hoff, MD  Nutritional Supplements (FEEDING SUPPLEMENT, NEPRO CARB STEADY,) LIQD Take 237 mLs by mouth 3 (three) times daily with meals. Patient not taking: Reported on 11/05/2015 11/03/15   Su Hoff, MD   Allergies  Allergen Reactions  . Sulfa Antibiotics Anaphylaxis    Itching, breaks ou  . Aminophylline Itching  . Other Itching    Sometimes reacts to binders in generic drugs - can be treated with claritin  . Clarithromycin Itching and Rash  . Ezetimibe-Simvastatin Rash  . Penicillins Rash    Has patient had a PCN reaction causing immediate rash, facial/tongue/throat swelling, SOB or lightheadedness with hypotension: Yes Has patient had a PCN reaction causing severe rash involving mucus membranes or skin necrosis: No Has patient had a PCN reaction that required hospitalization unknown Has patient had a PCN reaction occurring within the last 10 years: No If all of the above answers are "NO", then may proceed with Cephalosporin use.  Marland Kitchen Propoxyphene Itching  . Rofecoxib Rash   Review of  Systems Positive for pain in her back, pain secondary to recent rib fractures Physical Exam Elderly lady sitting up in a chair eating her lunch Awake alert Hard of hearing but answers questions appropriately S1-S2 Diminished breath sounds  Pain upper back towards left scapular area Edema bilateral lower extremities  Vital Signs: BP 104/67 mmHg  Pulse 64  Temp(Src) 97.6 F (36.4 C) (Oral)  Resp 17  Ht  (1.651 m)  Wt 64.4 kg (141 lb 15.6 oz)  BMI 23.63 kg/m2  SpO2 95% Pain Assessment: No/denies pain POSS *See Group Information*: 1-Acceptable,Awake and alert Pain Score: 0-No pain   SpO2: SpO2: 95 % O2 Device:SpO2: 95 % O2 Flow Rate: .O2 Flow Rate (L/min): 2 L/min  IO: Intake/output summary:  Intake/Output Summary (Last 24 hours) at 11/07/15 1501 Last data filed at 11/07/15 0956  Gross per 24 hour  Intake    350 ml  Output      0 ml  Net    350 ml    LBM: Last BM Date:  (pta) Baseline Weight: Weight: 67.495 kg (148 lb 12.8 oz) Most recent weight: Weight: 64.4 kg (141 lb 15.6 oz)     Palliative Assessment/Data:   Flowsheet Rows        Most Recent Value   Intake Tab    Referral Department  Hospitalist   Unit at Time of Referral  Cardiac/Telemetry Unit   Palliative Care Primary Diagnosis  Nephrology   Date Notified  11/06/15   Palliative Care Type  New Palliative care   Reason for referral  Clarify Goals of Care   Date of Admission  11/05/15   # of days IP prior to Palliative referral  1   Clinical Assessment    Palliative Performance Scale Score  30%   Pain Max last 24 hours  5   Pain Min Last 24 hours  4   Dyspnea Max Last 24 Hours  4   Dyspnea Min Last 24 hours  3   Psychosocial & Spiritual Assessment    Palliative Care Outcomes    Patient/Family meeting held?  Yes   Who was at the meeting?  daughter patient brother Dr Arlean Hopping    Palliative Care follow-up planned  Yes, Facility      Time In:  1400 Time Out:  1500 Time Total:  60  Greater than  50%  of this time was spent counseling and coordinating care related to the above assessment and plan.  Signed by: Rosalin Hawking, MD  [phone] Please contact Palliative Medicine Team phone at [phone] for questions and concerns.  For individual provider: See Loretha Stapler

## 2015-11-07 NOTE — Progress Notes (Signed)
Patient seen and examined. Case d/w residents in detail. I agree with findings and plan as documented in Dr. Neita GarnetHoffman's note.   Patient is alert and complaining of pain in her left side where she fractured her ribs. Patient was initially admitted for hypotension and generalized weakness and was noted to have lactic acidosis on admission. It is possible given her suprapubic pain and pyuria on admission that her symptoms were secondary to a UTI. Her lactic acidosis has now resolved and her mental status has improved. Will c/w IV ceftriaxone for now and f/u cultures which are negative till date. Given that she was on tylenol 3 on discharge it is possible that this contributed to her hypotension and confusion and the hypotension may have contributed to her lactic acidosis as well. This medication was stopped on admission. Would not resume.  We are awaiting palliative consult to discuss goals of care for the patient. Will f/u

## 2015-11-07 NOTE — Progress Notes (Signed)
   No significant pericardial effusion and normal LV function with no RWMA.  We will sign off.  Please refer to my note from yesterday.

## 2015-11-07 NOTE — Evaluation (Signed)
Occupational Therapy Evaluation Patient Details Name: [name] MRN: [id number] DOB: 11/30/1927 Today's Date: 11/07/2015    History of Present Illness [name] is an 82 year old woman with a history of end-stage renal disease requiring hemodialysis, hypertension, hyperlipidemia, and paroxysmal atrial fibrillation, HTN, CHF, Gout, and bil LE edema who presented with weakness, lactic acidemia and hypotension after recent d/c home following 2 day admission for L rib fracture sustained after falling out of bed.  She had great difficulty with getting out to dialysis and was referred back to the ED.    Clinical Impression   Pt admitted with above. She demonstrates the below listed deficits and will benefit from continued OT to maximize safety and independence with BADLs.  Pt requires mod - max A for ADLs and mod A for functional transfers.  Agree with plan for SNF.  Will follow.       Follow Up Recommendations  SNF    Equipment Recommendations  None recommended by OT    Recommendations for Other Services       Precautions / Restrictions Precautions Precautions: Fall Restrictions Weight Bearing Restrictions: No      Mobility Bed Mobility Overal bed mobility: Needs Assistance Bed Mobility: Supine to Sit     Supine to sit: Max assist     General bed mobility comments: Pt is very slow to initiate activity.  She requires assist to move LEs off bed and to lift trunk   Transfers Overall transfer level: Needs assistance Equipment used: 1 person hand held assist Transfers: Sit to/from UGI CorporationStand;Stand Pivot Transfers Sit to Stand: Mod assist Stand pivot transfers: Mod assist       General transfer comment: Pt requires assist to power up into standing and assist to pivot     Balance Overall balance assessment: Needs assistance Sitting-balance support: Feet supported Sitting balance-Leahy Scale: Fair     Standing balance support: Single extremity  supported Standing balance-Leahy Scale: Poor Standing balance comment: requires mod a                             ADL Overall ADL's : Needs assistance/impaired Eating/Feeding: Minimal assistance;Sitting   Grooming: Wash/dry hands;Wash/dry face;Oral care;Minimal assistance;Sitting   Upper Body Bathing: Moderate assistance;Sitting   Lower Body Bathing: Maximal assistance;Sit to/from stand   Upper Body Dressing : Moderate assistance;Sitting   Lower Body Dressing: Total assistance;Sit to/from stand   Toilet Transfer: Moderate assistance;Stand-pivot;BSC   Toileting- Clothing Manipulation and Hygiene: Total assistance;Sit to/from stand       Functional mobility during ADLs: Moderate assistance General ADL Comments: Pt requires max cues to participate      Vision     Perception     Praxis      Pertinent Vitals/Pain Pain Assessment: Faces Faces Pain Scale: Hurts even more Pain Location: Back  Pain Descriptors / Indicators: Aching;Grimacing Pain Intervention(s): Monitored during session;Repositioned     Hand Dominance Right   Extremity/Trunk Assessment Upper Extremity Assessment Upper Extremity Assessment: RUE deficits/detail;LUE deficits/detail RUE Deficits / Details: Pt refused to lift UEs overhead, but noted to functionally use them during transfers and simple grooming tasks  LUE Deficits / Details: RUE deficits/detail;LUE deficits/detail   Lower Extremity Assessment Lower Extremity Assessment: Defer to PT evaluation   Cervical / Trunk Assessment Cervical / Trunk Assessment: Kyphotic Cervical / Trunk Exceptions: scoliosis   Communication Communication Communication: HOH   Cognition Arousal/Alertness: Awake/alert Behavior During Therapy: Flat affect Overall Cognitive Status: Impaired/Different from  baseline Area of Impairment: Orientation;Attention;Following commands;Problem solving;Safety/judgement Orientation Level: Disoriented  to;Place;Time;Situation Current Attention Level: Sustained Memory: Decreased short-term memory Following Commands: Follows one step commands inconsistently Safety/Judgement: Decreased awareness of safety;Decreased awareness of deficits   Problem Solving: Slow processing;Decreased initiation;Difficulty sequencing;Requires verbal cues;Requires tactile cues General Comments: grand daughter reports cognitive deficits are not pt's baseline    General Comments       Exercises       Shoulder Instructions      Home Living Family/patient expects to be discharged to:: Skilled nursing facility                                 Additional Comments: Pt recently had moved in with her granddaughter who was assisting with her care       Prior Functioning/Environment Level of Independence: Needs assistance  Gait / Transfers Assistance Needed: None, but reports she thinks she needs to start using a RW because she can barely walk. ADL's / Homemaking Assistance Needed: Grand daughter reports that pt was mod I with ADLs prior to recent fall, but required assist with IADLs         OT Diagnosis: Generalized weakness;Cognitive deficits;Acute pain   OT Problem List: Decreased strength;Decreased activity tolerance;Impaired balance (sitting and/or standing);Decreased cognition;Decreased safety awareness;Decreased knowledge of use of DME or AE;Pain   OT Treatment/Interventions: Self-care/ADL training;Therapeutic exercise;DME and/or AE instruction;Therapeutic activities;Cognitive remediation/compensation;Patient/family education;Balance training    OT Goals(Current goals can be found in the care plan section) Acute Rehab OT Goals Patient Stated Goal: to regain strength  OT Goal Formulation: With patient/family Time For Goal Achievement: 11/21/15 Potential to Achieve Goals: Good ADL Goals Pt Will Perform Grooming: with set-up;sitting Pt Will Perform Upper Body Bathing: with set-up;with  supervision;sitting Pt Will Perform Lower Body Bathing: with mod assist;sit to/from stand Pt Will Transfer to Toilet: with min assist;stand pivot transfer;bedside commode Pt Will Perform Toileting - Clothing Manipulation and hygiene: with min assist;sit to/from stand  OT Frequency: Min 2X/week   Barriers to D/C: Decreased caregiver support          Co-evaluation              End of Session Equipment Utilized During Treatment: Gait belt Nurse Communication: Mobility status  Activity Tolerance: Patient tolerated treatment well Patient left: in chair;with call bell/phone within reach;with family/visitor present;with nursing/sitter in room   Time: 4098-1191 OT Time Calculation (min): 31 min Charges:  OT General Charges $OT Visit: 1 Procedure OT Evaluation $OT Eval Moderate Complexity: 1 Procedure OT Treatments $Therapeutic Activity: 8-22 mins G-Codes:    Kaylina Cahue M Nov 26, 2015, 6:18 PM

## 2015-11-07 NOTE — Progress Notes (Signed)
[facility]  Patient Status: Active (receiving services up to time of hospitalization)  [facility] is providing the following services: PT and OT  Referred for therapy but readmitted to Hospital prior to start of services.  If patient discharges after hours, please call [phone].   Kizzie FurnishDonna Fellmy 11/07/2015, 11:08 AM

## 2015-11-07 NOTE — Progress Notes (Signed)
[facility] Progress Note   Subjective: more alert and coherent today  Filed Vitals:   11/07/15 0003 11/07/15 0344 11/07/15 0821 11/07/15 1233  BP: 104/67  Pulse:   115 64  Temp:  97.5 F (36.4 C) 97.5 F (36.4 C) 97.6 F (36.4 C)  TempSrc:  Oral Oral Oral  Resp: Height:      Weight:      SpO2:  97% 89% 95%    Inpatient medications: . acetaminophen  1,000 mg Oral Q8H  . allopurinol  100 mg Oral QHS  . aspirin EC  81 mg Oral Daily  . cefTRIAXone (ROCEPHIN)  IV  1 g Intravenous Q24H  . cinacalcet  30 mg Oral QHS  . feeding supplement (GLUCERNA SHAKE)  237 mL Oral QHS  . heparin  5,000 Units Subcutaneous Q8H  . lidocaine  1 application Topical TID  . loratadine  10 mg Oral QHS  . midodrine  10 mg Oral Q supper  . sevelamer carbonate  800 mg Oral Q supper     midodrine  Exam: Elderly AAF, up in chair, frail, pleasant HOH NO jvd Chest clear bilat RRR normal S1S2 Abd soft ntnd +bs Ext 1-2+pitting edema UE's/ LE's , a little better Neuro alert, HOH Lt forearm AVG +bruit  CXR 7/11- IS edema c/w CHF  Dialysis: [facility] MWF  3.5h F160 64kg (just ^ from 62) 3K/2.25 bath P5 L forearm AVG Hep 1000 Last Mircera 100 6/28 no VDRA  Assessment/Plan: 1. FTT - recent fall w rib fracture, nonambulatory since 2. Possible PNA - switched to Rocephin  3. Recent rib fracture due to recent fall- limited mobility, will need SNF 4. + troponin - cards signed off 5. ESRD - MWF HD 6. Chronic hypotension - on midodrine 7. Vol^/ poss pulm edema - at dry but w pulm edema on admit and ext edema, needs vol off 8. Anemia - hgb 11 stable - hold on redose of ESA unless hgb trends down 9. Metabolic bone disease - Ca 8.2/ no VDRA - follow labs 10. Nutrition - alb 1.6 - needs renal diet/supplements/vitamins/prostat 11. Blister left heal - need to float heels - defer to primary       Plan - HD Friday, get vol down if BP and HR will  cooperate; ^ midodrine to 10 bid   Vinson Moselle MD [facility] pager [phone]    cell [phone] 11/07/2015, 2:01 PM    Recent Labs Lab 11/02/15 0538 11/05/15 1210 11/06/15 1330  NA 138 134* 136  K 4.2 5.1 3.8  CL 102 98* 102  CO2 GLUCOSE 78 98 107*  BUN <5* 18 11  CREATININE 2.72* 6.31* 3.53*  CALCIUM 7.8* 8.2* 7.7*  PHOS 2.3*  --  2.7    Recent Labs Lab 11/02/15 0538 11/05/15 1210 11/06/15 1330  AST  --  41  --   ALT  --  24  --   ALKPHOS  --  156*  --   BILITOT  --  1.4*  --   PROT  --  4.5*  --   ALBUMIN 1.5* 1.6* 1.8*    Recent Labs Lab 11/01/15 0746  11/03/15 0929 11/05/15 1210 11/06/15 0232  WBC 10.4  < > 7.1 6.8 4.9  NEUTROABS 9.2*  --   --  5.9  --   HGB 11.0*  < > 11.3* 11.0* 10.1*  HCT 34.7*  < > 34.1* 33.7*  30.7*  MCV 92.3  < > 88.3 89.2 87.0  PLT 61*  < > 54* 66* 59*  < > = values in this interval not displayed. Iron/TIBC/Ferritin/ %Sat    Component Value Date/Time   IRON 49 12/28/2008 0525   TIBC 172* 12/28/2008 0525   FERRITIN 201 12/27/2008 0549   IRONPCTSAT 28 12/28/2008 0525

## 2015-11-07 NOTE — Care Management Important Message (Signed)
Important Message  Patient Details  Name: [name] MRN: [id number] Date of Birth: 05/16/1927   Medicare Important Message Given:  Yes    [name] Abena 11/07/2015, 12:12 PM

## 2015-11-07 NOTE — Progress Notes (Signed)
   Subjective: [name] was evaluated this morning on rounds.  She appeared more alert than yesterday.  She states that she feels sick.  She states she continues to have left sided pain and points to her left side where she fractured her ribs a week ago.  She denies SOB, chest pain, abdominal pain or suprapubic pain.  Objective: Vital signs in last 24 hours: Filed Vitals:   11/06/15 2344 11/07/15 0003 11/07/15 0344 11/07/15 0821  BP: 84/57 85/56 90/55  90/51  Pulse: 89   115  Temp: 97.6 F (36.4 C)  97.5 F (36.4 C) 97.5 F (36.4 C)  TempSrc: Oral  Oral Oral  Resp: 12 9 18 17   Height:      Weight:      SpO2: 100%  97% 89%   Physical Exam Physical Exam  Constitutional:  Appears in mild distress.  Slowed speech and attention  HENT:  Head: Normocephalic and atraumatic.  Cardiovascular:  Irregularly Irregular  Pulmonary/Chest: Effort normal and breath sounds normal. No respiratory distress.  Abdominal: Soft. She exhibits no distension. There is no tenderness.  Genitourinary:  No further tenderness in supra pubic region  Musculoskeletal: She exhibits edema.  Edema bilaterally in upper and lower extremities 2+ in lower extremities   Neurological:  [facility] and [facility]  Skin: Skin is warm and dry.     Assessment/Plan: Lactic acidosis Current Lactic acid is 1.9.  She received 1g of IV ceftriaxone yesterday and because she lost IV access she was switched to Ciprofloxacin 250mg  for 1 dose.  IV access was reestablished and she is currently being given IV ceftriazone 1g daily.  Suspicion for UTI was high and treatment will be completed.  I still suspect that her elevated lactic acid was also attributed to the combination of codeine given at home and her low blood pressure that dropped lower than baseline.   - Continue IV Ceftriazone    ESRD Patient went to dialysis yesterday but was not able to tolerate and could not finish entire  treatment. - Awaiting palliative care meeting as patient may no longer be a candidate for hemodialysis    Left Rib fracture Continues to complain of left sided pain. - Continue Tylenol 1000mg  TID - Continue Lidocaine gel - was thinking of switching patient to patch but she has a medium allergy to ingredient in lidocaine patch.     Dispo: Anticipated discharge in approximately 2 day(s).   LOS: 2 days   Camelia PhenesJessica Ratliff Nicholos Aloisi, DO 11/07/2015, 12:20 PM Pager: [phone]

## 2015-11-07 NOTE — NC FL2 (Signed)
[place] MEDICAID FL2 LEVEL OF CARE SCREENING TOOL     IDENTIFICATION  Patient Name: [name] Birthdate: [birth year] Sex: female Admission Date (Current Location): 11/05/2015  [facility] and [place] Number:  Chiropodist and Address:  [facility], [address] [place], [place] [address]      Provider Number: [id number]  Attending Physician Name and Address:  Doneen Poisson, MD  Relative Name and Phone Number:       Current Level of Care: Hospital Recommended Level of Care: Skilled Nursing Facility Prior Approval Number:    Date Approved/Denied:   PASRR Number:    Discharge Plan: SNF    Current Diagnoses: Patient Active Problem List   Diagnosis Date Noted  . Acute cystitis without hematuria   . Generalized weakness 11/05/2015  . Hypotension 11/05/2015  . Lactic acidosis 11/05/2015  . Elevated troponin 11/05/2015  . Pressure ulcer 11/05/2015  . Thrombocytopenia (HCC)   . Rib fracture 11/01/2015  . Left rib fracture 11/01/2015  . End stage renal failure on dialysis (HCC)   . ESRD on dialysis (HCC) 12/21/2012  . AF (paroxysmal atrial fibrillation) (HCC)   . Atypical chest pain 07/24/2010  . Paroxysmal atrial fibrillation (HCC) 07/24/2010  . HYPERLIPIDEMIA, MIXED 12/31/2008  . Essential hypertension 12/31/2008  . UNSPECIFIED DISEASE OF RESPIRATORY SYSTEM 12/31/2008  . GERD 12/31/2008  . PUD 12/31/2008  . RENAL FAILURE, CHRONIC 12/31/2008  . CHEST PAIN, ATYPICAL, HX OF 12/31/2008    Orientation RESPIRATION BLADDER Height & Weight     Self  Normal Continent Weight: 64.4 kg (141 lb 15.6 oz) Height:   (165.1 cm)  BEHAVIORAL SYMPTOMS/MOOD NEUROLOGICAL BOWEL NUTRITION STATUS      Continent Diet (renal)  AMBULATORY STATUS COMMUNICATION OF NEEDS Skin   Extensive Assist Verbally  (Deep tissue injury on heel)                       Personal Care Assistance Level of Assistance  Bathing, Dressing Bathing  Assistance: Maximum assistance   Dressing Assistance: Maximum assistance     Functional Limitations Info             SPECIAL CARE FACTORS FREQUENCY  PT (By licensed PT), OT (By licensed OT)     PT Frequency: 5/wk OT Frequency: 5/wk            Contractures      Additional Factors Info  Code Status Code Status Info: FULL             Current Medications (11/07/2015):  This is the current hospital active medication list Current Facility-Administered Medications  Medication Dose Route Frequency Provider Last Rate Last Dose  . acetaminophen (TYLENOL) tablet 1,000 mg  1,000 mg Oral Q8H Alexa R Burns, MD   1,000 mg at 11/07/15 0518  . allopurinol (ZYLOPRIM) tablet 100 mg  100 mg Oral QHS Alexa Lucrezia Starch, MD   100 mg at 11/06/15 2157  . aspirin EC tablet 81 mg  81 mg Oral Daily Alexa Lucrezia Starch, MD   81 mg at 11/07/15 0936  . cefTRIAXone (ROCEPHIN) 1 g in dextrose 5 % 50 mL IVPB  1 g Intravenous Q24H Su Hoff, MD   1 g at 11/07/15 0936  . cinacalcet (SENSIPAR) tablet 30 mg  30 mg Oral QHS Alexa Lucrezia Starch, MD   30 mg at 11/06/15 2157  . feeding supplement (GLUCERNA SHAKE) (GLUCERNA SHAKE) liquid 237 mL  237 mL  Oral QHS Servando SnareAlexa R Burns, MD   237 mL at 11/06/15 2156  . heparin injection 5,000 Units  5,000 Units Subcutaneous Q8H Alexa Lucrezia Starch Burns, MD   5,000 Units at 11/07/15 0519  . lidocaine (XYLOCAINE) 2 % jelly 1 application  1 application Topical TID Camelia PhenesJessica Ratliff Hoffman, DO   1 application at 11/07/15 [phone]  . loratadine (CLARITIN) tablet 10 mg  10 mg Oral QHS Alexa Lucrezia Starch Burns, MD   10 mg at 11/06/15 2157  . midodrine (PROAMATINE) tablet 10 mg  10 mg Oral Q supper Alexa Lucrezia Starch Burns, MD   10 mg at 11/06/15 1618  . midodrine (PROAMATINE) tablet 10 mg  10 mg Oral PRN Scarlett Prestoheresa D Egan, RPH      . sevelamer carbonate (RENVELA) tablet 800 mg  800 mg Oral Q supper Alexa Lucrezia Starch Burns, MD   800 mg at 11/06/15 1618     Discharge Medications: Please see discharge summary for a list of discharge  medications.  Relevant Imaging Results:  Relevant Lab Results:   Additional Information [place] dialysis [place]WF at [facility] [place]- uses [facility] for transport  [place], [place] Poniente[place] M, [place]

## 2015-11-07 NOTE — Progress Notes (Signed)
New Admission Note:   Arrival Method: Bed Mental Orientation: Alert and oriented x 3-4 Telemetry: Assessment: Completed Skin: Warm, dry and intact IV: RUA Pain: Denies Tubes: N/a Safety Measures: Safety Fall Prevention Plan was given, discussed and signed. Admission: Completed [address] Orientation: Patient has been orientated to the room, unit and the staff. Family:  Orders have been reviewed and implemented. Will continue to monitor the patient. Call light has been placed within reach and bed alarm has been activated.   [place] ShiEmmanuel Burnie Hank BSN, RN  Phone Number: [phone]

## 2015-11-08 DIAGNOSIS — S2242XA Multiple fractures of ribs, left side, initial encounter for closed fracture: Secondary | ICD-10-CM

## 2015-11-08 DIAGNOSIS — W19XXXA Unspecified fall, initial encounter: Secondary | ICD-10-CM

## 2015-11-08 LAB — RENAL FUNCTION PANEL
ANION GAP: 11 (ref 5–15)
Albumin: 2.1 g/dL — ABNORMAL LOW (ref 3.5–5.0)
BUN: 17 mg/dL (ref 6–20)
CALCIUM: 8 mg/dL — AB (ref 8.9–10.3)
CHLORIDE: 99 mmol/L — AB (ref 101–111)
CO2: 23 mmol/L (ref 22–32)
CREATININE: 4.28 mg/dL — AB (ref 0.44–1.00)
GFR calc Af Amer: 10 mL/min — ABNORMAL LOW (ref >60)
GFR calc non Af Amer: 9 mL/min — ABNORMAL LOW (ref >60)
Glucose, Bld: 71 mg/dL (ref 65–99)
Phosphorus: 2.5 mg/dL (ref 2.5–4.6)
Potassium: 5.2 mmol/L — ABNORMAL HIGH (ref 3.5–5.1)
SODIUM: 133 mmol/L — AB (ref 135–145)

## 2015-11-08 LAB — CBC
HCT: 28.8 % — ABNORMAL LOW (ref 36.0–46.0)
Hemoglobin: 9.7 g/dL — ABNORMAL LOW (ref 12.0–15.0)
MCH: 29.3 pg (ref 26.0–34.0)
MCHC: 33.7 g/dL (ref 30.0–36.0)
MCV: 87 fL (ref 78.0–100.0)
PLATELETS: 58 10*3/uL — AB (ref 150–400)
RBC: 3.31 MIL/uL — ABNORMAL LOW (ref 3.87–5.11)
RDW: 20.5 % — ABNORMAL HIGH (ref 11.5–15.5)
WBC: 5.9 10*3/uL (ref 4.0–10.5)

## 2015-11-08 MED ORDER — LIDOCAINE-PRILOCAINE 2.5-2.5 % EX CREA: 1.0000 "application " | TOPICAL_CREAM | CUTANEOUS | Status: DC | PRN

## 2015-11-08 MED ORDER — PENTAFLUOROPROP-TETRAFLUOROETH EX AERO: 1.0000 "application " | INHALATION_SPRAY | CUTANEOUS | Status: DC | PRN

## 2015-11-08 MED ORDER — ALTEPLASE 2 MG IJ SOLR: 2.0000 mg | Freq: Once | INTRAMUSCULAR | Status: DC | PRN

## 2015-11-08 MED ORDER — SODIUM CHLORIDE 0.9 % IV SOLN: 100.0000 mL | INTRAVENOUS | Status: DC | PRN

## 2015-11-08 MED ORDER — HEPARIN SODIUM (PORCINE) 1000 UNIT/ML DIALYSIS: 1000.0000 [IU] | INTRAMUSCULAR | Status: DC | PRN

## 2015-11-08 MED ORDER — LIDOCAINE HCL (PF) 1 % IJ SOLN: 5.0000 mL | INTRAMUSCULAR | Status: DC | PRN

## 2015-11-08 MED ORDER — HEPARIN SODIUM (PORCINE) 1000 UNIT/ML DIALYSIS: 1000.0000 [IU] | Freq: Once | INTRAMUSCULAR | Status: DC

## 2015-11-08 MED ORDER — MIDODRINE HCL 5 MG PO TABS
10.0000 mg | ORAL_TABLET | Freq: Two times a day (BID) | ORAL | Status: DC
  Administered 2015-11-08 – 2015-11-10 (×4): 10 mg via ORAL
  Filled 2015-11-08 (×4): qty 2

## 2015-11-08 NOTE — Progress Notes (Signed)
PT Cancellation Note  Patient Details Name: [name] MRN: [id number] DOB: 04/12/1928   Cancelled Treatment:    Reason Eval/Treat Not Completed: Patient at procedure or test/unavailable (pt at HD, will attempt later today. )   Tamala SerUhlenberg, Thamar Holik Kistler 11/08/2015, 10:00 AM [phone]

## 2015-11-08 NOTE — Procedures (Signed)
I have seen and examined this patient and agree with the plan of care  Seen on dialysis  ---  Failure to thrive   Decubiti ulcers  --- palliative care recommended  Dialysis limited by low blood pressures and being unable to remove fluid  Hb 9.7  Corrected calcium 9.6    Alb 2.1    Tifini Reeder W 11/08/2015, 8:24 AM

## 2015-11-08 NOTE — Clinical Social Work Note (Addendum)
SNF placement has been recommended for patient, however CSW trying to determine patient correct name in order to submit for PASRR. Hospical has [employer]illian P. [name], however patient's [employer] information has patient as [name]. CSW attempted to talk with patient, however patient was very slow to respond and appeared to be hard of hearing. She did tell CSW that her name at birth was [name].  CSW talked with patient's daughter [name] and granddaughter [name] (conference call) regarding patient's name and was informed that different documents have different renderings of her name, but her social security card has [name] and her ss# is [id number]. Granddaughter faxed ss card to CSW. Will use full name on ss card for PASRR number.  Patient's PASRR number is [id number] [id number], effective 11/08/15.  Genelle BalVanessa Devun Anna, MSW, LCSW Licensed Clinical Social Worker Clinical Social Work Department [facility] [phone]

## 2015-11-08 NOTE — Clinical Social Work Note (Signed)
Clinical Social Work Assessment  Patient Details  Name: [name] MRN: [id number] Date of Birth: 01/24/1928  Date of referral:  11/07/15               Reason for consult:  Facility Placement                Permission sought to share information with:  Family Supports Permission granted to share information::  Yes, Verbal Permission Granted  Name::     [name]  Agency::  SNFs  Relationship::  dtr/poa  Contact Information:     Housing/Transportation Living arrangements for the past 2 months:  Single Family Home Source of Information:  Adult Children Patient Interpreter Needed:  None Criminal Activity/Legal Involvement Pertinent to Current Situation/Hospitalization:    Significant Relationships:  Adult Children, Other Family Members Lives with:  Self Do you feel safe going back to the place where you live?  No Need for family participation in patient care:  Yes (Comment) (will need to move in with family member following rehab)  Care giving concerns:  Pt lives at home alone and has decreased strength and mobility at this time- has a aid at home but not 24 hours a day and has been struggling to get to dialysis   Social Worker assessment / plan:  CSW spoke with pt dtr, Jamesetta SoPhyllis, and pt at bedside concerning recommendation for SNF.  Pt was very lethargic during interview and would open her eyes briefly before drifting back to sleep.  CSW discussed with pt dtr who acknowledges pt is not safe to go home in current condition.  CSW explained SNF and short term benefits of SNF.  Employment status:  Retired Database administratornsurance information:  Managed Medicare PT Recommendations:  Skilled Nursing Facility Information / Referral to community resources:  Skilled Nursing Facility  Patient/Family's Response to care:  Dtr is agreeable to SNF stay for her mom as long as it is clear it will be short term- also wants to make sure pt is agreeable when she becomes more alert.  Patient/Family's  Understanding of and Emotional Response to Diagnosis, Current Treatment, and Prognosis:  No questions or concerns at this time- family is hopeful pt will improve enough physically to return home with granddaughter where she will be more comfortable.  Emotional Assessment Appearance:  Appears stated age Attitude/Demeanor/Rapport:  Lethargic Affect (typically observed):    Orientation:  Oriented to Self, Oriented to Place, Oriented to  Time, Oriented to Situation Alcohol / Substance use:  Not Applicable Psych involvement (Current and /or in the community):  No (Comment)  Discharge Needs  Concerns to be addressed:  Care Coordination Readmission within the last 30 days:  Yes Current discharge risk:  Physical Impairment, Lives alone Barriers to Discharge:  Continued Medical Work up   Peab[employer] EnergyHoloman, Tae Robak M, LCSW 11/08/2015, 7:55 AM

## 2015-11-08 NOTE — Progress Notes (Signed)
   Subjective: [name] was in Dialysis this morning on rounds.  She complained of left sided back pain.  She denies any SOB, chest pain, abdominal pain or suprapubic pain.  Objective: Vital signs in last 24 hours: Filed Vitals:   11/08/15 1050 11/08/15 1120 11/08/15 1145 11/08/15 1236  BP: 83/46 81/44 99/54  84/49  Pulse: 95 109 115 99  Temp:   97.3 F (36.3 C) 97.6 F (36.4 C)  TempSrc:   Oral Oral  Resp: 23 20 22 16   Height:      Weight:   147 lb 11.3 oz (67 kg)   SpO2:    83%   Physical Exam Physical Exam  Constitutional:  Appeared in mild distress  HENT:  Head: Normocephalic and atraumatic.  Cardiovascular:  Irregularly irregular  Pulmonary/Chest: Effort normal and breath sounds normal. No respiratory distress.  Abdominal: Soft. She exhibits no distension. There is no tenderness.  Musculoskeletal:  Bilateral 2+ pitting edema in lower extremities     Assessment/Plan: Hypotension Patient continues to be hypotensive 80s/50s.  Her baseline is around 90s/60s.  Patient has ESRD that requires dialysis and with BPs low she has not been able to receive dialysis or complete it at times.  She has peripheral edema in both upper and lower extremities bilaterally.  At this time I do not want to give fluids for risk of worsening volume overload.  She is not SOB or having chest pain.  Will continue to monitor and continue dialysis when appropriate.  With her left sided back pain will not give opioids as this might further lower her blood pressure.  Palliative care meeting occurred yesterday with patient and patients daughter per palliative care team.  Family would like to continue dialysis and transfer to SNF for rehabilitation once ready for discharge.  UTI  Lactic Acid was elevated on admission and infection became higher on the differential.  UA showed Leukocytes and patient is being treated with IV ceftriaxone - Cont IV ceftriazone  ESRD Patient was evaluated during her  dialysis this morning.  Patient appeared to tolerate at this time.   -Continue Dialysis MWF as long as patient can tolerate.  Left Rib fracture - Continues to complain of left sided pain. - Continue Tylenol 1000mg  TID - Continue Lidocaine gel TID  Dispo: Anticipated discharge in approximately 2 day(s).   LOS: 3 days   Kaitlyn PhenesJessica Gonzales Kaitlyn Soule, DO 11/08/2015, 3:40 PM Pager: [phone]

## 2015-11-08 NOTE — Progress Notes (Signed)
Internal Medicine Attending  Date: 11/08/2015  Patient name: [name] Medical record number: [id number] Date of birth: 05/30/1927 Age: 82 y.o. Gender: female  I saw and evaluated the patient. I reviewed the resident's note by Dr. Glenard Haringatliff-Hoffman and I agree with the resident's findings and plans as documented in her progress note.  [name] is much more alert and interactive than upon admission. She continues to have left flank pain where she had her rib fractures and this is not surprising. We are being conservative with our analgesia so as not to cause a relapse in her confusion. Her blood pressures remained low but not too far from her baseline. When she was seen on rounds today she was actively undergoing hemodialysis. Her goals of care have been clarified and the plan is for short-term skilled nursing facility for rehabilitation. I suspect she will be ready for discharge to a skilled nursing facility in the next 24-48 hours. I also suspect if she remains asymptomatic with this lower blood pressure this may be her new norm.

## 2015-11-08 NOTE — Progress Notes (Signed)
Physical Therapy Treatment Patient Details Name: [name] Manz MRN: [id number] DOB: 01/29/1928 Today's Date: 11/08/2015    History of Present Illness [name] is an 82 year old woman with a history of end-stage renal disease requiring hemodialysis, hypertension, hyperlipidemia, and paroxysmal atrial fibrillation, HTN, CHF, Gout, and bil LE edema who presented with weakness, lactic acidemia and hypotension after recent d/c home following 2 day admission for L rib fracture sustained after falling out of bed.  She had great difficulty with getting out to dialysis and was referred back to the ED.     PT Comments    Mod assist for bed to recliner transfer. Performed BLE exercises in supine with min assist. Pt is generally quite weak and fatigues quickly but puts forth good effort. She c/o back pain, RN notified.   Follow Up Recommendations  SNF     Equipment Recommendations  None recommended by PT    Recommendations for Other Services       Precautions / Restrictions Precautions Precautions: Fall Restrictions Weight Bearing Restrictions: No    Mobility  Bed Mobility Overal bed mobility: Needs Assistance Bed Mobility: Supine to Sit     Supine to sit: Mod assist     General bed mobility comments: Pt is slow to initiate activity.  She requires assist to move LEs off bed and to lift trunk   Transfers Overall transfer level: Needs assistance Equipment used: Rolling walker (2 wheeled)   Sit to Stand: Mod assist Stand pivot transfers: Mod assist       General transfer comment: Pt requires assist to power up into standing and assist to pivot; Pt stood for 10 sec with RW but was unable to weight shift to turn to recliner so returned to sitting and performed SPT with mod A.   Ambulation/Gait                 Stairs            Wheelchair Mobility    Modified Rankin (Stroke Patients Only)       Balance     Sitting balance-Leahy Scale: Fair       Standing balance-Leahy Scale: Poor                      Cognition Arousal/Alertness: Awake/alert Behavior During Therapy: Flat affect Overall Cognitive Status: Within Functional Limits for tasks assessed         Following Commands: Follows one step commands with increased time     Problem Solving: Slow processing      Exercises General Exercises - Lower Extremity Ankle Circles/Pumps: AROM;Both;Supine;AAROM;10 reps Heel Slides: AAROM;Both;Supine;10 reps Hip ABduction/ADduction: AAROM;Both;10 reps;Supine    General Comments        Pertinent Vitals/Pain Faces Pain Scale: Hurts even more Pain Location: back Pain Descriptors / Indicators: Sore Pain Intervention(s): Monitored during session;Limited activity within patient's tolerance;Patient requesting pain meds-RN notified    Home Living                      Prior Function            PT Goals (current goals can now be found in the care plan section) Acute Rehab PT Goals Patient Stated Goal: to regain strength  Time For Goal Achievement: 11/13/15 Potential to Achieve Goals: Fair Progress towards PT goals: Progressing toward goals    Frequency  Min 3X/week    PT Plan Current plan remains appropriate    Co-evaluation  End of Session Equipment Utilized During Treatment: Gait belt Activity Tolerance: Patient limited by fatigue;No increased pain Patient left: with call bell/phone within reach;in chair;with nursing/sitter in room     Time: 1239-1304 PT Time Calculation (min) (ACUTE ONLY): 25 min  Charges:  $Therapeutic Exercise: 8-22 mins $Therapeutic Activity: 8-22 mins                    G Codes:      Tamala Ser 11/08/2015, 1:13 PM [phone]

## 2015-11-09 DIAGNOSIS — E876 Hypokalemia: Secondary | ICD-10-CM

## 2015-11-09 LAB — RENAL FUNCTION PANEL
Albumin: 2.5 g/dL — ABNORMAL LOW (ref 3.5–5.0)
Anion gap: 8 (ref 5–15)
BUN: 5 mg/dL — ABNORMAL LOW (ref 6–20)
CO2: 27 mmol/L (ref 22–32)
Calcium: 7.6 mg/dL — ABNORMAL LOW (ref 8.9–10.3)
Chloride: 97 mmol/L — ABNORMAL LOW (ref 101–111)
Creatinine, Ser: 1.69 mg/dL — ABNORMAL HIGH (ref 0.44–1.00)
GFR calc Af Amer: 31 mL/min — ABNORMAL LOW (ref >60)
GFR calc non Af Amer: 26 mL/min — ABNORMAL LOW (ref >60)
Glucose, Bld: 101 mg/dL — ABNORMAL HIGH (ref 65–99)
Phosphorus: 1.3 mg/dL — ABNORMAL LOW (ref 2.5–4.6)
Potassium: 2.7 mmol/L — CL (ref 3.5–5.1)
Sodium: 132 mmol/L — ABNORMAL LOW (ref 135–145)

## 2015-11-09 MED ORDER — LIDOCAINE HCL (PF) 1 % IJ SOLN: 5.0000 mL | INTRAMUSCULAR | Status: DC | PRN

## 2015-11-09 MED ORDER — LIDOCAINE-PRILOCAINE 2.5-2.5 % EX CREA: 1.0000 "application " | TOPICAL_CREAM | CUTANEOUS | Status: DC | PRN

## 2015-11-09 MED ORDER — MIDODRINE HCL 5 MG PO TABS
ORAL_TABLET | ORAL | Status: AC
  Filled 2015-11-09: qty 2

## 2015-11-09 MED ORDER — SODIUM CHLORIDE 0.9 % IV SOLN: 100.0000 mL | INTRAVENOUS | Status: DC | PRN

## 2015-11-09 MED ORDER — ALTEPLASE 2 MG IJ SOLR: 2.0000 mg | Freq: Once | INTRAMUSCULAR | Status: DC | PRN

## 2015-11-09 MED ORDER — PENTAFLUOROPROP-TETRAFLUOROETH EX AERO: 1.0000 "application " | INHALATION_SPRAY | CUTANEOUS | Status: DC | PRN

## 2015-11-09 MED ORDER — HEPARIN SODIUM (PORCINE) 1000 UNIT/ML DIALYSIS: 20.0000 [IU]/kg | INTRAMUSCULAR | Status: DC | PRN

## 2015-11-09 MED ORDER — ACETAMINOPHEN 325 MG PO TABS
650.0000 mg | ORAL_TABLET | Freq: Once | ORAL | Status: AC
  Administered 2015-11-09: 650 mg via ORAL

## 2015-11-09 MED ORDER — HEPARIN SODIUM (PORCINE) 1000 UNIT/ML DIALYSIS: 1000.0000 [IU] | INTRAMUSCULAR | Status: DC | PRN

## 2015-11-09 MED ORDER — ALBUMIN HUMAN 25 % IV SOLN
25.0000 g | Freq: Once | INTRAVENOUS | Status: AC
  Administered 2015-11-09: 25 g via INTRAVENOUS
  Filled 2015-11-09: qty 100

## 2015-11-09 MED ORDER — ACETAMINOPHEN 325 MG PO TABS
ORAL_TABLET | ORAL | Status: AC
  Filled 2015-11-09: qty 2

## 2015-11-09 MED ORDER — POTASSIUM CHLORIDE CRYS ER 20 MEQ PO TBCR
60.0000 meq | EXTENDED_RELEASE_TABLET | Freq: Once | ORAL | Status: AC
  Administered 2015-11-09: 60 meq via ORAL
  Filled 2015-11-09: qty 3

## 2015-11-09 MED ORDER — DARBEPOETIN ALFA 60 MCG/0.3ML IJ SOSY: 60.0000 ug | PREFILLED_SYRINGE | INTRAMUSCULAR | Status: DC

## 2015-11-09 MED ORDER — ALBUMIN HUMAN 25 % IV SOLN
INTRAVENOUS | Status: AC
  Filled 2015-11-09: qty 100

## 2015-11-09 NOTE — Evaluation (Signed)
Clinical/Bedside Swallow Evaluation Patient Details  Name: [name] MRN: [id number] Date of Birth: 08/11/1927  Today's Date: 11/09/2015 Time: SLP Start Time (ACUTE ONLY): 1200 SLP Stop Time (ACUTE ONLY): 1217 SLP Time Calculation (min) (ACUTE ONLY): 17 min  Past Medical History:  Past Medical History  Diagnosis Date  . Chest pain 07/07/10  . Hypertension   . Edema 07/24/10    of ankles  . Generalized headaches 03/12  . Lightheadedness 07/24/10  . Reflux   . Fatigue   . Arthritis   . Gout   . Hyperglycemia     Borderline diabetes  . Peptic ulcer disease   . AF (paroxysmal atrial fibrillation) (HCC)     Used to be on Warfarin in the past but was taken off due to falls, memory problems.  . CHF (congestive heart failure) (HCC)     30 years ago  . GERD (gastroesophageal reflux disease)   . Diabetes mellitus without complication (HCC)     borderline    . End stage renal disease on dialysis [facility]) since 2010    related to Vioxx, HD T-TH-SAT   Past Surgical History:  Past Surgical History  Procedure Laterality Date  . Hemodialysis catheter      & Placement left forearm AV graft   . Arteriovenous graft placement    . Abdominal hysterectomy    . Cholecystectomy    . Eye surgery      bilateral cataracts  . Revision of arteriovenous goretex graft Left 01/06/2013    Procedure: REVISION OF ARTERIOVENOUS GORETEX GRAFT;  Surgeon: Chuck Hint, MD;  Location: [facility] OR;  Service: Vascular;  Laterality: Left;   HPI:  [name] is an 82 year old woman with a history of end-stage renal disease requiring hemodialysis, hypertension, hyperlipidemia, and paroxysmal atrial fibrillation, HTN, CHF, Gout, and bil LE edema who presented with weakness, lactic acidemia and hypotension after recent d/c home following 2 day admission for L rib fracture sustained after falling out of bed.Possible PNA per MD notes. :Findings consistent with mild pulmonary interstitial edema likely  secondary to CHF. Increased interstitial density focally in the left lung and right upper lobe may reflect early pneumonia or early alveolar edema. Small bilateral pleural effusions are present.   Assessment / Plan / Recommendation Clinical Impression  Evaluation limited as patient being taken for HD. Overall, oropharyngeal swallow appearing functional with pureed solids and thin liquids without evidence of a mechanical dysphagia. Was unable to assess with soft solids however grandaughter present and confirms nursing reports of mastication for an excessive period of time before expectorating bolus stating she is unable to swallow. When questioned, patient does not endore pain with swallow and unable to verbally pinpoint deficit. Highly suspect this is a result of AMS. Will downgrade to pureed solids and f/u for tolerance and ability to re-advance as mentation and strength improves.     Aspiration Risk  Mild aspiration risk    Diet Recommendation Dysphagia 1 (Puree);Thin liquid   Liquid Administration via: Cup;Straw Medication Administration: Whole meds with liquid Supervision: Full supervision/cueing for compensatory strategies;Staff to assist with self feeding Compensations: Slow rate;Small sips/bites Postural Changes: Seated upright at 90 degrees    Other  Recommendations Oral Care Recommendations: Oral care BID   Follow up Recommendations   (TBD)    Frequency and Duration min 2x/week  1 week       Prognosis Prognosis for Safe Diet Advancement: Fair Barriers to Reach Goals: Cognitive deficits      Swallow  Study   General HPI: [name] is an 82 year old woman with a history of end-stage renal disease requiring hemodialysis, hypertension, hyperlipidemia, and paroxysmal atrial fibrillation, HTN, CHF, Gout, and bil LE edema who presented with weakness, lactic acidemia and hypotension after recent d/c home following 2 day admission for L rib fracture sustained after falling out of  bed.Possible PNA per MD notes. :Findings consistent with mild pulmonary interstitial edema likely secondary to CHF. Increased interstitial density focally in the left lung and right upper lobe may reflect early pneumonia or early alveolar edema. Small bilateral pleural effusions are present. Type of Study: Bedside Swallow Evaluation Previous Swallow Assessment: none Diet Prior to this Study: Regular;Thin liquids Temperature Spikes Noted: No Respiratory Status: Room air History of Recent Intubation: No Behavior/Cognition: Lethargic/Drowsy;Cooperative (little verbal communication) Oral Cavity Assessment: Within Functional Limits Oral Care Completed by SLP: Recent completion by staff Vision: Functional for self-feeding Self-Feeding Abilities: Needs assist Patient Positioning: Upright in bed Baseline Vocal Quality: Low vocal intensity Volitional Cough: Weak Volitional Swallow: Able to elicit    Oral/Motor/Sensory Function Overall Oral Motor/Sensory Function: Generalized oral weakness   Ice Chips Ice chips: Within functional limits Presentation: Spoon   Thin Liquid Thin Liquid: Within functional limits Presentation: Cup;Straw    Nectar Thick Nectar Thick Liquid: Not tested   Honey Thick Honey Thick Liquid: Not tested   Puree Puree: Impaired Presentation: Spoon Oral Phase Functional Implications: Prolonged oral transit   Solid   GO  Octavia Mottola MA, CCC-SLP [phone]  Solid: Not tested (patient being taken for HD)        Clois Montavon Meryl 11/09/2015,1:57 PM

## 2015-11-09 NOTE — Procedures (Signed)
I have seen and examined this patient and agree with the plan of care . Patient seen on dialysis extra for fluid removal   Ihsan Nomura W 11/09/2015, 12:30 PM

## 2015-11-09 NOTE — Progress Notes (Signed)
   Subjective: [name] had dialysis today and reported no complications.  She continues to endorse left sided rib pain.  She denies SOB, chest pain or abdominal pain.  She asked for her dinner stating she is hungry.   Objective: Vital signs in last 24 hours: Filed Vitals:   11/09/15 1530 11/09/15 1600 11/09/15 1630 11/09/15 1728  BP: 99/50 104/45 99/48 83/40   Pulse: 85 91 88 78  Temp:   97.3 F (36.3 C) 97.3 F (36.3 C)  TempSrc:   Oral Oral  Resp: 18 16 14 16   Height:      Weight:   145 lb 1 oz (65.8 kg)   SpO2: 95% 94% 95% 96%   Physical Exam Physical Exam  Constitutional: She is oriented to person, place, and time.  Frail, but continues to improve in mentation  HENT:  Head: Normocephalic and atraumatic.  Eyes: Pupils are equal, round, and reactive to light.  Cardiovascular:  Irregularly Irregular  Pulmonary/Chest: Effort normal and breath sounds normal. No respiratory distress.  Abdominal: Soft. Bowel sounds are normal. She exhibits no distension. There is no tenderness.  Musculoskeletal:  2+ pitting edema bilaterally in lower extremity Edema noted in bilaterally upper extremities    Neurological: She is alert and oriented to person, place, and time.   BMET    Component Value Date/Time   NA 132* 11/09/2015 1331   K 2.7* 11/09/2015 1331   CL 97* 11/09/2015 1331   CO2 27 11/09/2015 1331   GLUCOSE 101* 11/09/2015 1331   BUN 5* 11/09/2015 1331   CREATININE 1.69* 11/09/2015 1331   CALCIUM 7.6* 11/09/2015 1331   CALCIUM 8.6 12/27/2008 1625   GFRNONAA 26* 11/09/2015 1331   GFRAA 31* 11/09/2015 1331    CBC    Component Value Date/Time   WBC 5.9 11/08/2015 0442   RBC 3.31* 11/08/2015 0442   HGB 9.7* 11/08/2015 0442   HCT 28.8* 11/08/2015 0442   PLT 58* 11/08/2015 0442   MCV 87.0 11/08/2015 0442   MCH 29.3 11/08/2015 0442   MCHC 33.7 11/08/2015 0442   RDW 20.5* 11/08/2015 0442   LYMPHSABS 0.5* 11/05/2015 1210   MONOABS 0.4 11/05/2015 1210   EOSABS 0.0  11/05/2015 1210   BASOSABS 0.0 11/05/2015 1210      Assessment/Plan: Hypotension Patient's BP has shown improvement and moved towards her baseline. -Continue to monitor   ESRD Patient had dialysis today for extra fluid removal . This is an extra day than her usual schedule of MWF.  Patient appeared to tolerate the session..  -Continue Dialysis MWF as long as patient can tolerate.  Hypokalemia Potassium is 2.7 -Kdur 60mEq once PO  Left Rib fracture Continues to complain of left sided pain. - Continue Tylenol 1000mg  TID - Continue Lidocaine gel TID  Dispo: Anticipated discharge in approximately 1 day(s).   LOS: 4 days   Camelia PhenesJessica Ratliff Teren Franckowiak, DO 11/09/2015, 5:43 PM Pager: [phone]

## 2015-11-09 NOTE — Progress Notes (Signed)
Patient pocketing food.  Chewing one bite for 20 minutes and then spit it out. No problems noted with swallowing medication.  MD notified.

## 2015-11-09 NOTE — Progress Notes (Signed)
Internal Medicine Attending  Date: 11/09/2015  Patient name: [name] Medical record number: [id number] Date of birth: 04/23/1928 Age: 82 y.o. Gender: female  I saw and evaluated the patient. I reviewed the resident's note by Dr. Glenard Haringatliff-Hoffman and I agree with the resident's findings and plans as documented in her progress note.  When I saw [name] on rounds this morning she was resting comfortably in bed. She did state she had pain in her left flank where she had her rib fractures. She was otherwise without complaints and specifically denied any pain in the suprapubic area. We will try nonsteroidal anti-inflammatory agents to help with her pain and avoid narcotics so as not to confuse her. We are awaiting skilled nursing facility placement for further short-term rehabilitation before she returns home.

## 2015-11-09 NOTE — Progress Notes (Signed)
[facility] Progress Note  Assessment/Plan: 1. FTT - recent fall w rib fracture, nonambulatory since 2. Possible PNA - switched to Rocephin, afebrile, BC no growth 3. Recent rib fracture due to recent fall- limited mobility, will need SNF 4. + troponin - cards signed off- thought to be secondary to demand ischemia in the setting of hypotension; overall cards thought was poor prognosis and recommends palliative care consult 5. ESRD - MWF HD = for extra treatment  Saturday 6. Chronic hypotension - on midodrine EF 65- 70%/ no pericardial effusion 7/12 7. Vol^/ poss pulm edema -  Net UF 733 7/11 241 7/12 and 600 7/14- significantly above EDW  BP low - needs ultrafiltration - try extra HD today with alb, lower BFR - longer time, lower temp; if she is ultimately discharged needs longer times 8. Anemia - hgb 9.7 - could have a dilutional component - resume ESA next week 9. Metabolic bone disease - Ca 8.2/ no VDRA - follow labs P 2s- hold renvela - continue sensipar she is off calcitriol 0.75 since early June due to ^ corr Ca 10.8  10. Nutrition - alb 2.1 - needs renal diet/supplements/vitamins/prostat 11. Blister left heel - need to float heels - defer to primary- stable   12.    Disp - if it becomes impossible to keep BP up - we may need to have a different discussion than SNF  placement  Sheffield Slider, PA-C [facility] Beeper [phone] 11/09/2015,9:51 AM  LOS: 4 days   Subjective:   Eating sausage cheese biscuit brought by family.  Objective Filed Vitals:   11/08/15 2042 11/08/15 2128 11/09/15 0620 11/09/15 0735  BP: 92/46  87/50 83/48  Pulse: 71  75 73  Temp: 97.7 F (36.5 C)  97.4 F (36.3 C) 97.6 F (36.4 C)  TempSrc: Oral  Oral Oral  Resp: Height:      Weight:  68.6 kg (151 lb 3.8 oz)    SpO2: 90%  90% 90%   Physical Exam General:alert and conversant Heart: irreg irreg Lungs: dim BS no overt rales Abdomen: obese  soft Extremities: generalized pitting edema - arms, legs and dependent areas; left heel blister unchanged from admission Dialysis Access: left lower AVGG + bruit  Dialysis Orders: [facility] MWF 3.5 hours 160 EDW 64 (just raised from 62) 3 K 2.25 Ca Ab 450 A 1.5 profile 5 left lower AVGG; heparin 1000 Last Mircera 100 6/28 no VDRA- last calcitriol 0.75 - held early June due to hypercalcemia 10.8 - sensipar 30   Additional Objective Labs: Basic Metabolic Panel:  Recent Labs Lab 11/05/15 1210 11/06/15 1330 11/08/15 0442  NA 134* 136 133*  K 5.1 3.8 5.2*  CL 98* 102 99*  CO2 GLUCOSE 98 107* 71  BUN CREATININE 6.31* 3.53* 4.28*  CALCIUM 8.2* 7.7* 8.0*  PHOS  --  2.7 2.5   Liver Function Tests:  Recent Labs Lab 11/05/15 1210 11/06/15 1330 11/08/15 0442  AST 41  --   --   ALT 24  --   --   ALKPHOS 156*  --   --   BILITOT 1.4*  --   --   PROT 4.5*  --   --   ALBUMIN 1.6* 1.8* 2.1*   CBC:  Recent Labs Lab 11/03/15 0929 11/05/15 1210 11/06/15 0232 11/08/15 0442  WBC 7.1 6.8 4.9 5.9  NEUTROABS  --  5.9  --   --  HGB 11.3* 11.0* 10.1* 9.7*  HCT 34.1* 33.7* 30.7* 28.8*  MCV 88.3 89.2 87.0 87.0  PLT 54* 66* 59* 58*   Blood Culture    Component Value Date/Time   SDES BLOOD RIGHT HAND 11/05/2015 1603   SPECREQUEST IN PEDIATRIC BOTTLE 2CC 11/05/2015 1603   CULT NO GROWTH 3 DAYS 11/05/2015 1603   REPTSTATUS PENDING 11/05/2015 1603    Cardiac Enzymes:  Recent Labs Lab 11/05/15 1210 11/05/15 1844  TROPONINI 0.35* 0.42*   Lab Results  Component Value Date   INR 1.93* 11/01/2015   INR 1.15 12/18/2009   INR 1.28 03/18/2009  Medications:   . acetaminophen  1,000 mg Oral Q8H  . allopurinol  100 mg Oral QHS  . aspirin EC  81 mg Oral Daily  . cefTRIAXone (ROCEPHIN)  IV  1 g Intravenous Q24H  . cinacalcet  30 mg Oral QHS  . feeding supplement (GLUCERNA SHAKE)  237 mL Oral QHS  . heparin  5,000 Units Subcutaneous Q8H  . lidocaine  1 application  Topical TID  . loratadine  10 mg Oral QHS  . midodrine  10 mg Oral BID  . sevelamer carbonate  800 mg Oral Q supper

## 2015-11-10 DIAGNOSIS — R001 Bradycardia, unspecified: Secondary | ICD-10-CM | POA: Diagnosis not present

## 2015-11-10 DIAGNOSIS — E8770 Fluid overload, unspecified: Secondary | ICD-10-CM | POA: Diagnosis not present

## 2015-11-10 DIAGNOSIS — M6281 Muscle weakness (generalized): Secondary | ICD-10-CM | POA: Diagnosis not present

## 2015-11-10 DIAGNOSIS — R579 Shock, unspecified: Secondary | ICD-10-CM | POA: Diagnosis not present

## 2015-11-10 DIAGNOSIS — R0902 Hypoxemia: Secondary | ICD-10-CM | POA: Diagnosis not present

## 2015-11-10 DIAGNOSIS — K219 Gastro-esophageal reflux disease without esophagitis: Secondary | ICD-10-CM | POA: Diagnosis not present

## 2015-11-10 DIAGNOSIS — Z515 Encounter for palliative care: Secondary | ICD-10-CM | POA: Diagnosis not present

## 2015-11-10 DIAGNOSIS — R5381 Other malaise: Secondary | ICD-10-CM | POA: Diagnosis not present

## 2015-11-10 DIAGNOSIS — R488 Other symbolic dysfunctions: Secondary | ICD-10-CM | POA: Diagnosis not present

## 2015-11-10 DIAGNOSIS — G92 Toxic encephalopathy: Secondary | ICD-10-CM | POA: Diagnosis not present

## 2015-11-10 DIAGNOSIS — E876 Hypokalemia: Secondary | ICD-10-CM | POA: Diagnosis not present

## 2015-11-10 DIAGNOSIS — E43 Unspecified severe protein-calorie malnutrition: Secondary | ICD-10-CM | POA: Diagnosis not present

## 2015-11-10 DIAGNOSIS — R4182 Altered mental status, unspecified: Secondary | ICD-10-CM | POA: Diagnosis not present

## 2015-11-10 DIAGNOSIS — E871 Hypo-osmolality and hyponatremia: Secondary | ICD-10-CM | POA: Diagnosis not present

## 2015-11-10 DIAGNOSIS — I509 Heart failure, unspecified: Secondary | ICD-10-CM | POA: Diagnosis not present

## 2015-11-10 DIAGNOSIS — D62 Acute posthemorrhagic anemia: Secondary | ICD-10-CM | POA: Diagnosis not present

## 2015-11-10 DIAGNOSIS — R2681 Unsteadiness on feet: Secondary | ICD-10-CM | POA: Diagnosis not present

## 2015-11-10 DIAGNOSIS — E119 Type 2 diabetes mellitus without complications: Secondary | ICD-10-CM | POA: Diagnosis not present

## 2015-11-10 DIAGNOSIS — N390 Urinary tract infection, site not specified: Secondary | ICD-10-CM | POA: Diagnosis not present

## 2015-11-10 DIAGNOSIS — R400 Somnolence: Secondary | ICD-10-CM | POA: Diagnosis not present

## 2015-11-10 DIAGNOSIS — Z66 Do not resuscitate: Secondary | ICD-10-CM | POA: Diagnosis not present

## 2015-11-10 DIAGNOSIS — I132 Hypertensive heart and chronic kidney disease with heart failure and with stage 5 chronic kidney disease, or end stage renal disease: Secondary | ICD-10-CM | POA: Diagnosis not present

## 2015-11-10 DIAGNOSIS — R791 Abnormal coagulation profile: Secondary | ICD-10-CM | POA: Diagnosis not present

## 2015-11-10 DIAGNOSIS — N2581 Secondary hyperparathyroidism of renal origin: Secondary | ICD-10-CM | POA: Diagnosis not present

## 2015-11-10 DIAGNOSIS — I9589 Other hypotension: Secondary | ICD-10-CM | POA: Diagnosis not present

## 2015-11-10 DIAGNOSIS — D631 Anemia in chronic kidney disease: Secondary | ICD-10-CM | POA: Diagnosis not present

## 2015-11-10 DIAGNOSIS — E1122 Type 2 diabetes mellitus with diabetic chronic kidney disease: Secondary | ICD-10-CM | POA: Diagnosis not present

## 2015-11-10 DIAGNOSIS — M109 Gout, unspecified: Secondary | ICD-10-CM | POA: Diagnosis not present

## 2015-11-10 DIAGNOSIS — R1311 Dysphagia, oral phase: Secondary | ICD-10-CM | POA: Diagnosis not present

## 2015-11-10 DIAGNOSIS — N186 End stage renal disease: Secondary | ICD-10-CM | POA: Diagnosis not present

## 2015-11-10 DIAGNOSIS — I480 Paroxysmal atrial fibrillation: Secondary | ICD-10-CM | POA: Diagnosis not present

## 2015-11-10 DIAGNOSIS — B9689 Other specified bacterial agents as the cause of diseases classified elsewhere: Secondary | ICD-10-CM | POA: Diagnosis not present

## 2015-11-10 DIAGNOSIS — I950 Idiopathic hypotension: Secondary | ICD-10-CM | POA: Diagnosis not present

## 2015-11-10 DIAGNOSIS — R6521 Severe sepsis with septic shock: Secondary | ICD-10-CM | POA: Diagnosis not present

## 2015-11-10 DIAGNOSIS — R627 Adult failure to thrive: Secondary | ICD-10-CM | POA: Diagnosis not present

## 2015-11-10 DIAGNOSIS — I953 Hypotension of hemodialysis: Secondary | ICD-10-CM | POA: Diagnosis not present

## 2015-11-10 DIAGNOSIS — I5032 Chronic diastolic (congestive) heart failure: Secondary | ICD-10-CM | POA: Diagnosis not present

## 2015-11-10 DIAGNOSIS — S2239XA Fracture of one rib, unspecified side, initial encounter for closed fracture: Secondary | ICD-10-CM | POA: Diagnosis not present

## 2015-11-10 DIAGNOSIS — Z992 Dependence on renal dialysis: Secondary | ICD-10-CM | POA: Diagnosis not present

## 2015-11-10 DIAGNOSIS — Z9181 History of falling: Secondary | ICD-10-CM | POA: Diagnosis not present

## 2015-11-10 DIAGNOSIS — S2242XD Multiple fractures of ribs, left side, subsequent encounter for fracture with routine healing: Secondary | ICD-10-CM | POA: Diagnosis not present

## 2015-11-10 DIAGNOSIS — R740 Nonspecific elevation of levels of transaminase and lactic acid dehydrogenase [LDH]: Secondary | ICD-10-CM | POA: Diagnosis not present

## 2015-11-10 DIAGNOSIS — D689 Coagulation defect, unspecified: Secondary | ICD-10-CM | POA: Diagnosis not present

## 2015-11-10 DIAGNOSIS — E11649 Type 2 diabetes mellitus with hypoglycemia without coma: Secondary | ICD-10-CM | POA: Diagnosis not present

## 2015-11-10 DIAGNOSIS — E785 Hyperlipidemia, unspecified: Secondary | ICD-10-CM | POA: Diagnosis not present

## 2015-11-10 DIAGNOSIS — I959 Hypotension, unspecified: Secondary | ICD-10-CM | POA: Diagnosis not present

## 2015-11-10 DIAGNOSIS — R5383 Other fatigue: Secondary | ICD-10-CM | POA: Diagnosis not present

## 2015-11-10 DIAGNOSIS — I120 Hypertensive chronic kidney disease with stage 5 chronic kidney disease or end stage renal disease: Secondary | ICD-10-CM | POA: Diagnosis not present

## 2015-11-10 DIAGNOSIS — R031 Nonspecific low blood-pressure reading: Secondary | ICD-10-CM | POA: Diagnosis not present

## 2015-11-10 DIAGNOSIS — A419 Sepsis, unspecified organism: Secondary | ICD-10-CM | POA: Diagnosis not present

## 2015-11-10 DIAGNOSIS — R680 Hypothermia, not associated with low environmental temperature: Secondary | ICD-10-CM | POA: Diagnosis not present

## 2015-11-10 LAB — BASIC METABOLIC PANEL
ANION GAP: 8 (ref 5–15)
BUN: 8 mg/dL (ref 6–20)
CHLORIDE: 99 mmol/L — AB (ref 101–111)
CO2: 26 mmol/L (ref 22–32)
Calcium: 7.9 mg/dL — ABNORMAL LOW (ref 8.9–10.3)
Creatinine, Ser: 2.14 mg/dL — ABNORMAL HIGH (ref 0.44–1.00)
GFR calc non Af Amer: 20 mL/min — ABNORMAL LOW (ref >60)
GFR, EST AFRICAN AMERICAN: 23 mL/min — AB (ref >60)
GLUCOSE: 95 mg/dL (ref 65–99)
Potassium: 5.7 mmol/L — ABNORMAL HIGH (ref 3.5–5.1)
Sodium: 133 mmol/L — ABNORMAL LOW (ref 135–145)

## 2015-11-10 LAB — CULTURE, BLOOD (ROUTINE X 2)
Culture: NO GROWTH
Culture: NO GROWTH

## 2015-11-10 LAB — CBC
HEMATOCRIT: 27.5 % — AB (ref 36.0–46.0)
HEMOGLOBIN: 9.2 g/dL — AB (ref 12.0–15.0)
MCH: 28.8 pg (ref 26.0–34.0)
MCHC: 33.5 g/dL (ref 30.0–36.0)
MCV: 85.9 fL (ref 78.0–100.0)
Platelets: 74 10*3/uL — ABNORMAL LOW (ref 150–400)
RBC: 3.2 MIL/uL — AB (ref 3.87–5.11)
RDW: 20.4 % — ABNORMAL HIGH (ref 11.5–15.5)
WBC: 7.7 10*3/uL (ref 4.0–10.5)

## 2015-11-10 MED ORDER — LIDOCAINE HCL 2 % EX GEL: 1.0000 "application " | Freq: Three times a day (TID) | CUTANEOUS | Status: AC

## 2015-11-10 MED ORDER — MIDODRINE HCL 10 MG PO TABS: 10.0000 mg | ORAL_TABLET | Freq: Two times a day (BID) | ORAL | Status: AC

## 2015-11-10 MED ORDER — ONDANSETRON HCL 4 MG/2ML IJ SOLN
4.0000 mg | Freq: Three times a day (TID) | INTRAMUSCULAR | Status: DC | PRN
  Administered 2015-11-10: 4 mg via INTRAVENOUS
  Filled 2015-11-10: qty 2

## 2015-11-10 NOTE — Progress Notes (Signed)
Internal Medicine Attending  Date: 11/10/2015  Patient name: [name] Medical record number: [id number] Date of birth: 06/11/1927 Age: 82 y.o. Gender: female  I saw and evaluated the patient. I reviewed the resident's note by Dr. Glenard Haringatliff-Hoffman and I agree with the resident's findings and plans as documented in her progress note.  A skilled nursing facility bed has been found for [name] and she is stable for transfer for short-term rehabilitation prior to returning home.

## 2015-11-10 NOTE — Progress Notes (Signed)
   Subjective: [name] was evaluated this morning on rounds.  She was sleeping and wanted to continue resting.  She had no complaints.  She denied SOB, chest pain, abdominal pain.  Objective: Vital signs in last 24 hours: Filed Vitals:   11/09/15 1728 11/09/15 2211 11/10/15 0500 11/10/15 0736  BP: 83/40 110/66 95/54 89/55   Pulse: 78 116 92 75  Temp: 97.3 F (36.3 C) 97.5 F (36.4 C) 97.5 F (36.4 C) 97.5 F (36.4 C)  TempSrc: Oral Oral Oral Oral  Resp: 16 16 16 17   Height:      Weight:      SpO2: 96% 100% 98% 100%   Physical Exam Physical Exam  Cardiovascular:  Irregularly Irregular   Pulmonary/Chest: Effort normal and breath sounds normal. No respiratory distress.  Abdominal: Soft. She exhibits no distension. There is no tenderness.  Musculoskeletal:  Bilateral lower extremity 2+ pitting edema  Skin: Skin is warm and dry.     Assessment/Plan: Patient has found placement at SNF and will be discharged today.  Hypotension Midodrine 10mg  was started on 7/14 twice daily and BP has improved to 100s/50s and this appears to be close to her baseline.       Left rib fracture - Continue Tylenol 1000mg  TID - Continue Lidocaine gel TID  End stage renal failure on dialysis (HCC) -Continue Dialysis MWF as long as patient can tolerate.  Hypokalemia K was previously 2.7 and one dose of Kdur 60mEq was given.  Potassium is currently 5.7 - no further Kdur needed    Dispo: Anticipated discharge today.   LOS: 5 days   Camelia PhenesJessica Ratliff Cavion Faiola, DO 11/10/2015, 12:28 PM Pager: [phone]

## 2015-11-10 NOTE — Progress Notes (Signed)
Report called to Oct[facility]loria at [facility].  All questions answered.

## 2015-11-10 NOTE — Discharge Summary (Signed)
Name: [name] MRN: [id number] DOB: 09/24/1927 82 y.o. PCP: No primary care provider on file.  Date of Admission: 11/05/2015 10:53 AM Date of Discharge: 11/10/2015 Attending Physician: Doneen PoissonLawrence Klima, MD  Discharge Diagnosis: Principal Diagnosis: Altered mental status Active Problems: Hypotension ESRD Left Rib Fractures   Discharge Medications:   Medication List    STOP taking these medications        Lidocaine 2 % Gel     pantoprazole 40 MG tablet  Commonly known as:  PROTONIX      TAKE these medications        acetaminophen 500 MG tablet  Commonly known as:  TYLENOL  Take 2 tablets (1,000 mg total) by mouth every 8 (eight) hours.     allopurinol 100 MG tablet  Commonly known as:  ZYLOPRIM  Take 100 mg by mouth at bedtime.     aspirin EC 81 MG tablet  Take 81 mg by mouth daily.     cinacalcet 30 MG tablet  Commonly known as:  SENSIPAR  Take 30 mg by mouth at bedtime.     clopidogrel 75 MG tablet  Commonly known as:  PLAVIX  Take 75 mg by mouth daily.     GLUCERNA Liqd  Take 237 mLs by mouth at bedtime.     feeding supplement (NEPRO CARB STEADY) Liqd  Take 237 mLs by mouth 3 (three) times daily with meals.     lidocaine 2 % jelly  Commonly known as:  XYLOCAINE  Apply 1 application topically 3 (three) times daily.     loratadine 10 MG tablet  Commonly known as:  CLARITIN  Take 10 mg by mouth at bedtime.     midodrine 10 MG tablet  Commonly known as:  PROAMATINE  Take 1 tablet (10 mg total) by mouth 2 (two) times daily.     multivitamin Tabs tablet  Take 1 tablet by mouth at bedtime.     pyridOXINE 100 MG tablet  Commonly known as:  VITAMIN B-6  Take 100 mg by mouth at bedtime.     sevelamer carbonate 800 MG tablet  Commonly known as:  RENVELA  Take 800 mg by mouth at bedtime.     thiamine 100 MG tablet  Commonly known as:  VITAMIN B-1  Take 100 mg by mouth at bedtime.        Disposition and follow-up:   Kaitlyn P  [name].  At the hospital follow up visit please address:  1.  Hypotension- Midodrine increased to BID. Check that BPs are stable, have remained in 90s-100s systolic.  Rib Fractures- Assess pain control. On scheduled Tylenol and lidocaine gel. Recommend to avoid opioids as this caused significant confusion.   2.  Labs / imaging needed at time of follow-up: bmet- assess K  3.  Pending labs/ test needing follow-up: None   Follow-up Appointments: She will need to follow up with her PCP in the next 1-2 weeks  Hospital Course by problem list:   Altered Mental Status: [name] is an 82yo woman with PMHx of ESRD, paroxysmal AFib, and hyperlipidemia who presented with generalized weakness, confusion, and lactic acidosis. Infection was suspected given her high lactate of 5 and UA revealed findings consistent with UTI. Initially there was some concern about possible pulmonary infiltrates, but the CXR was not a sufficient picture and she had improved on Ceftriaxone for her UTI. Some of her confusion was also attributed to  her family giving her 3 Tylenol #3 pills. An echocardiogram was performed as well to rule out heart failure as the cause for her lactic acidosis. Her echo revealed a normal EF and no wall motion abnormalities. She completed a 5 day course of Ceftriaxone and was back to her baseline mental status by time of discharge. She will be discharged to SNF for further rehab.   Hypotension: BPs initially in the 70s-80s on admission that was attributed to her acute UTI. She does have hypotension at baseline, which has been present for at least the last 6 months. Her midodrine was increased to 10 mg BID, which helped keep her BPs stable in the 90s-100s systolic.   Left Rib Fractures: She was admitted on 7/7 for left-sided rib fractures after she had fallen out of bed. Her pain was well controlled with Tylenol 1000 mg three times  daily scheduled and Lidocaine gel applied to the affected area three times daily. Recommend to avoid any opioids as these cause significant confusion.   Discharge Vitals:   BP 89/55 mmHg  Pulse 75  Temp(Src) 97.5 F (36.4 C) (Oral)  Resp 17  Ht  (1.651 m)  Wt 145 lb 1 oz (65.8 kg)  BMI 24.14 kg/m2  SpO2 100% Physical Exam  Constitutional: She is oriented to person, place, and time.  Frail, but continues to improve in mentation  HENT:  Head: Normocephalic and atraumatic.  Eyes: Pupils are equal, round, and reactive to light.  Cardiovascular:  Irregularly Irregular  Pulmonary/Chest: Effort normal and breath sounds normal. No respiratory distress.  Abdominal: Soft. Bowel sounds are normal. She exhibits no distension. There is no tenderness.  Musculoskeletal:  2+ pitting edema bilaterally in lower extremity Edema noted in bilaterally upper extremities  Neurological: She is alert and oriented to person, place, and time  Pertinent Labs, Studies, and Procedures:  Lactic acid 4.63>3.97>5.46>4.8>4.4>1.9 UA: large leuks, many bacteria, TNTC WBCs Blood cx NGTD  CXR 7/11 Findings consistent with mild pulmonary interstitial edema likely secondary to CHF. Increased interstitial density focally in the left lung and right upper lobe may reflect early pneumonia or early alveolar edema. Small bilateral pleural effusions are present.  Discharge Instructions: Discharge Instructions    Diet - low sodium heart healthy    Complete by:  As directed      Discharge instructions    Complete by:  As directed   It was a pleasure taking care of you, [name]. You were hospitalized for generalized weakness and confusion that was related to a urinary tract infection, which has been treated.  Please remember: 1. Avoid any opioid-containing pain medications as these make you confused and are not safe for you. This includes Tylenol #3. 2. Take Tylenol 1000 mg three times daily and use the  lidocaine gel for your rib fracture pains. 3. Make sure to take Midodrine 10 mg twice daily for your blood pressure 4. Please follow up with your primary care doctor in the next 1-2 weeks  Take care, Dr. Beckie Salts     Increase activity slowly    Complete by:  As directed            Signed: Su Hoff, MD 11/10/2015, 12:18 PM   Pager: [phone]

## 2015-11-10 NOTE — Progress Notes (Signed)
[facility] ROUNDING NOTE   Subjective:   Interval History: no complaints resting in bed   Objective:  Vital signs in last 24 hours:  Temp:  [97.3 F (36.3 C)-97.5 F (36.4 C)] 97.5 F (36.4 C) (07/16 0736) Pulse Rate:  [73-116] 75 (07/16 0736) Resp:  [14-20] 17 (07/16 0736) BP: (83-129)/(40-66) 89/55 mmHg (07/16 0736) SpO2:  [93 %-100 %] 100 % (07/16 0736) Weight:  [65.8 kg (145 lb 1 oz)-67.3 kg (148 lb 5.9 oz)] 65.8 kg (145 lb 1 oz) (07/15 1630)  Weight change: 0 kg (0 lb) Filed Weights   11/08/15 2128 11/09/15 1215 11/09/15 1630  Weight: 68.6 kg (151 lb 3.8 oz) 67.3 kg (148 lb 5.9 oz) 65.8 kg (145 lb 1 oz)    Intake/Output: I/O last 3 completed shifts: In: 920 [P.O.:920] Out: 1500 [Other:1500]   Intake/Output this shift:     General:alert and conversant Heart: irreg irreg Lungs: dim BS no overt rales Abdomen: obese soft Extremities: generalized pitting edema - arms, legs and dependent areas; left heel blister unchanged from admission Dialysis Access: left lower AVGG + bruit  Dialysis Orders: [facility] MWF 3.5 hours 160 EDW 64 (just raised from 62) 3 K 2.25 Ca Ab 450 A 1.5 profile 5 left lower AVGG; heparin 1000 Last Mircera 100 6/28 no VDRA- last calcitriol 0.75 - held early June due to hypercalcemia 10.8 - sensipar 30    Basic Metabolic Panel:  Recent Labs Lab 11/05/15 1210 11/06/15 1330 11/08/15 0442 11/09/15 1331  NA 134* 136 133* 132*  K 5.1 3.8 5.2* 2.7*  CL 98* 102 99* 97*  CO2 GLUCOSE 98 107* 71 101*  BUN 5*  CREATININE 6.31* 3.53* 4.28* 1.69*  CALCIUM 8.2* 7.7* 8.0* 7.6*  PHOS  --  2.7 2.5 1.3*    Liver Function Tests:  Recent Labs Lab 11/05/15 1210 11/06/15 1330 11/08/15 0442 11/09/15 1331  AST 41  --   --   --   ALT 24  --   --   --   ALKPHOS 156*  --   --   --   BILITOT 1.4*  --   --   --   PROT 4.5*  --   --   --   ALBUMIN 1.6* 1.8* 2.1* 2.5*   No results for input(s): LIPASE, AMYLASE in the  last 168 hours. No results for input(s): AMMONIA in the last 168 hours.  CBC:  Recent Labs Lab 11/05/15 1210 11/06/15 0232 11/08/15 0442  WBC 6.8 4.9 5.9  NEUTROABS 5.9  --   --   HGB 11.0* 10.1* 9.7*  HCT 33.7* 30.7* 28.8*  MCV 89.2 87.0 87.0  PLT 66* 59* 58*    Cardiac Enzymes:  Recent Labs Lab 11/05/15 1210 11/05/15 1844  TROPONINI 0.35* 0.42*    BNP: Invalid input(s): POCBNP  CBG: No results for input(s): GLUCAP in the last 168 hours.  Microbiology: Results for orders placed or performed during the hospital encounter of 11/05/15  Blood culture (routine x 2)     Status: None (Preliminary result)   Collection Time: 11/05/15 12:10 PM  Result Value Ref Range Status   Specimen Description BLOOD RIGHT ANTECUBITAL  Final   Special Requests BOTTLES DRAWN AEROBIC AND ANAEROBIC 5CC  Final   Culture NO GROWTH 4 DAYS  Final   Report Status PENDING  Incomplete  Blood culture (routine x 2)     Status: None (Preliminary result)   Collection Time:  11/05/15  4:03 PM  Result Value Ref Range Status   Specimen Description BLOOD RIGHT HAND  Final   Special Requests IN PEDIATRIC BOTTLE 2CC  Final   Culture NO GROWTH 4 DAYS  Final   Report Status PENDING  Incomplete  MRSA PCR Screening     Status: None   Collection Time: 11/05/15  6:10 PM  Result Value Ref Range Status   MRSA by PCR NEGATIVE NEGATIVE Final    Comment:        The GeneXpert MRSA Assay (FDA approved for NASAL specimens only), is one component of a comprehensive MRSA colonization surveillance program. It is not intended to diagnose MRSA infection nor to guide or monitor treatment for MRSA infections.     Coagulation Studies: No results for input(s): LABPROT, INR in the last 72 hours.  Urinalysis: No results for input(s): COLORURINE, LABSPEC, PHURINE, GLUCOSEU, HGBUR, BILIRUBINUR, KETONESUR, PROTEINUR, UROBILINOGEN, NITRITE, LEUKOCYTESUR in the last 72 hours.  Invalid input(s): APPERANCEUR     Imaging: No results found.   Medications:     . acetaminophen  1,000 mg Oral Q8H  . allopurinol  100 mg Oral QHS  . aspirin EC  81 mg Oral Daily  . cefTRIAXone (ROCEPHIN)  IV  1 g Intravenous Q24H  . cinacalcet  30 mg Oral QHS  . [START ON 11/11/2015] darbepoetin (ARANESP) injection - DIALYSIS  60 mcg Intravenous Q Mon-HD  . feeding supplement (GLUCERNA SHAKE)  237 mL Oral QHS  . heparin  5,000 Units Subcutaneous Q8H  . lidocaine  1 application Topical TID  . loratadine  10 mg Oral QHS  . midodrine  10 mg Oral BID   midodrine, ondansetron (ZOFRAN) IV  Assessment/ Plan:  1. FTT - recent fall w rib fracture, nonambulatory since 2. Possible PNA - switched to Rocephin, afebrile, BC no growth 3. Recent rib fracture due to recent fall- limited mobility, will need SNF 4. + troponin - cards signed off- thought to be secondary to demand ischemia in the setting of hypotension; overall cards thought was poor prognosis and recommends palliative care consult 5. ESRD - MWF HD =extra treatment Saturday 6. Chronic hypotension - on midodrine EF 65- 70%/ no pericardial effusion 7/12 7. Vol^/ poss pulm edema - Net UF 733 7/11 241 7/12 and 600 7/14- significantly above EDW BP low - needs ultrafiltration - try extra HD today with alb, lower BFR - longer time, lower temp; if she is ultimately discharged needs longer times 8. Anemia - hgb 9.7 - could have a dilutional component - resume ESA next week 9. Metabolic bone disease - Ca 8.2/ no VDRA - follow labs P 2s- hold renvela - continue sensipar she is off calcitriol 0.75 since early June due to ^ corr Ca 10.8  10. Nutrition - alb 2.1 - needs renal diet/supplements/vitamins/prostat 11. Blister left heel - need to float heels - defer to primary- stable  12. Disp - if it becomes impossible to keep BP up - we may need to have a different discussion than SNF    LOS: 5 [name] W @TODAY @9 :44 AM

## 2015-11-10 NOTE — Clinical Social Work Note (Signed)
Clinical Social Worker facilitated patient discharge including contacting patient family and facility to confirm patient discharge plans.  Clinical information faxed to facility and family agreeable with plan.  CSW arranged ambulance transport via [facility] to [facility].  RN to call report prior to discharge.  Clinical Social Worker will sign off for now as social work intervention is no longer needed. Please consult us again if new need arises.  Macario GoldsJesse Stpehanie Montroy, [place] [phone]

## 2015-11-11 DIAGNOSIS — Z992 Dependence on renal dialysis: Secondary | ICD-10-CM | POA: Diagnosis not present

## 2015-11-11 DIAGNOSIS — D631 Anemia in chronic kidney disease: Secondary | ICD-10-CM | POA: Diagnosis not present

## 2015-11-11 DIAGNOSIS — N186 End stage renal disease: Secondary | ICD-10-CM | POA: Diagnosis not present

## 2015-11-11 DIAGNOSIS — E876 Hypokalemia: Secondary | ICD-10-CM | POA: Diagnosis not present

## 2015-11-11 DIAGNOSIS — D689 Coagulation defect, unspecified: Secondary | ICD-10-CM | POA: Diagnosis not present

## 2015-11-11 DIAGNOSIS — I120 Hypertensive chronic kidney disease with stage 5 chronic kidney disease or end stage renal disease: Secondary | ICD-10-CM | POA: Diagnosis not present

## 2015-11-12 DIAGNOSIS — R5381 Other malaise: Secondary | ICD-10-CM | POA: Diagnosis not present

## 2015-11-12 DIAGNOSIS — N186 End stage renal disease: Secondary | ICD-10-CM | POA: Diagnosis not present

## 2015-11-12 DIAGNOSIS — E119 Type 2 diabetes mellitus without complications: Secondary | ICD-10-CM | POA: Diagnosis not present

## 2015-11-13 DIAGNOSIS — E876 Hypokalemia: Secondary | ICD-10-CM | POA: Diagnosis not present

## 2015-11-13 DIAGNOSIS — N186 End stage renal disease: Secondary | ICD-10-CM | POA: Diagnosis not present

## 2015-11-13 DIAGNOSIS — D631 Anemia in chronic kidney disease: Secondary | ICD-10-CM | POA: Diagnosis not present

## 2015-11-13 DIAGNOSIS — D689 Coagulation defect, unspecified: Secondary | ICD-10-CM | POA: Diagnosis not present

## 2015-11-15 ENCOUNTER — Emergency Department (HOSPITAL_COMMUNITY): Payer: Medicare Other

## 2015-11-15 ENCOUNTER — Inpatient Hospital Stay (HOSPITAL_COMMUNITY)
Admission: EM | Admit: 2015-11-15 | Discharge: 2015-11-26 | DRG: 871 | Disposition: E | Payer: Medicare Other | Attending: Pulmonary Disease | Admitting: Pulmonary Disease

## 2015-11-15 ENCOUNTER — Inpatient Hospital Stay (HOSPITAL_COMMUNITY): Payer: Medicare Other

## 2015-11-15 ENCOUNTER — Encounter (HOSPITAL_COMMUNITY): Payer: Self-pay | Admitting: Emergency Medicine

## 2015-11-15 DIAGNOSIS — N186 End stage renal disease: Secondary | ICD-10-CM | POA: Diagnosis not present

## 2015-11-15 DIAGNOSIS — Z515 Encounter for palliative care: Secondary | ICD-10-CM | POA: Diagnosis not present

## 2015-11-15 DIAGNOSIS — R400 Somnolence: Secondary | ICD-10-CM

## 2015-11-15 DIAGNOSIS — R031 Nonspecific low blood-pressure reading: Secondary | ICD-10-CM | POA: Diagnosis not present

## 2015-11-15 DIAGNOSIS — R4182 Altered mental status, unspecified: Secondary | ICD-10-CM | POA: Diagnosis not present

## 2015-11-15 DIAGNOSIS — Z7902 Long term (current) use of antithrombotics/antiplatelets: Secondary | ICD-10-CM

## 2015-11-15 DIAGNOSIS — R001 Bradycardia, unspecified: Secondary | ICD-10-CM | POA: Diagnosis not present

## 2015-11-15 DIAGNOSIS — E1122 Type 2 diabetes mellitus with diabetic chronic kidney disease: Secondary | ICD-10-CM | POA: Diagnosis present

## 2015-11-15 DIAGNOSIS — I959 Hypotension, unspecified: Secondary | ICD-10-CM | POA: Diagnosis not present

## 2015-11-15 DIAGNOSIS — Z66 Do not resuscitate: Secondary | ICD-10-CM | POA: Diagnosis not present

## 2015-11-15 DIAGNOSIS — I5032 Chronic diastolic (congestive) heart failure: Secondary | ICD-10-CM | POA: Diagnosis not present

## 2015-11-15 DIAGNOSIS — Z881 Allergy status to other antibiotic agents status: Secondary | ICD-10-CM

## 2015-11-15 DIAGNOSIS — I480 Paroxysmal atrial fibrillation: Secondary | ICD-10-CM | POA: Diagnosis not present

## 2015-11-15 DIAGNOSIS — A419 Sepsis, unspecified organism: Secondary | ICD-10-CM | POA: Diagnosis not present

## 2015-11-15 DIAGNOSIS — I953 Hypotension of hemodialysis: Secondary | ICD-10-CM | POA: Diagnosis not present

## 2015-11-15 DIAGNOSIS — R627 Adult failure to thrive: Secondary | ICD-10-CM | POA: Diagnosis not present

## 2015-11-15 DIAGNOSIS — R5383 Other fatigue: Secondary | ICD-10-CM | POA: Diagnosis not present

## 2015-11-15 DIAGNOSIS — Z880 Allergy status to penicillin: Secondary | ICD-10-CM

## 2015-11-15 DIAGNOSIS — Z452 Encounter for adjustment and management of vascular access device: Secondary | ICD-10-CM

## 2015-11-15 DIAGNOSIS — R680 Hypothermia, not associated with low environmental temperature: Secondary | ICD-10-CM | POA: Diagnosis not present

## 2015-11-15 DIAGNOSIS — E871 Hypo-osmolality and hyponatremia: Secondary | ICD-10-CM | POA: Diagnosis not present

## 2015-11-15 DIAGNOSIS — Z7982 Long term (current) use of aspirin: Secondary | ICD-10-CM

## 2015-11-15 DIAGNOSIS — I9589 Other hypotension: Secondary | ICD-10-CM | POA: Diagnosis present

## 2015-11-15 DIAGNOSIS — I509 Heart failure, unspecified: Secondary | ICD-10-CM | POA: Diagnosis not present

## 2015-11-15 DIAGNOSIS — R740 Nonspecific elevation of levels of transaminase and lactic acid dehydrogenase [LDH]: Secondary | ICD-10-CM | POA: Diagnosis present

## 2015-11-15 DIAGNOSIS — E876 Hypokalemia: Secondary | ICD-10-CM | POA: Diagnosis not present

## 2015-11-15 DIAGNOSIS — G92 Toxic encephalopathy: Secondary | ICD-10-CM | POA: Diagnosis present

## 2015-11-15 DIAGNOSIS — Z833 Family history of diabetes mellitus: Secondary | ICD-10-CM

## 2015-11-15 DIAGNOSIS — Z6827 Body mass index (BMI) 27.0-27.9, adult: Secondary | ICD-10-CM

## 2015-11-15 DIAGNOSIS — Z888 Allergy status to other drugs, medicaments and biological substances status: Secondary | ICD-10-CM

## 2015-11-15 DIAGNOSIS — Z992 Dependence on renal dialysis: Secondary | ICD-10-CM

## 2015-11-15 DIAGNOSIS — D62 Acute posthemorrhagic anemia: Secondary | ICD-10-CM | POA: Diagnosis not present

## 2015-11-15 DIAGNOSIS — M109 Gout, unspecified: Secondary | ICD-10-CM | POA: Diagnosis not present

## 2015-11-15 DIAGNOSIS — E43 Unspecified severe protein-calorie malnutrition: Secondary | ICD-10-CM | POA: Diagnosis present

## 2015-11-15 DIAGNOSIS — I132 Hypertensive heart and chronic kidney disease with heart failure and with stage 5 chronic kidney disease, or end stage renal disease: Secondary | ICD-10-CM | POA: Diagnosis not present

## 2015-11-15 DIAGNOSIS — Z87891 Personal history of nicotine dependence: Secondary | ICD-10-CM

## 2015-11-15 DIAGNOSIS — D631 Anemia in chronic kidney disease: Secondary | ICD-10-CM | POA: Diagnosis not present

## 2015-11-15 DIAGNOSIS — Z841 Family history of disorders of kidney and ureter: Secondary | ICD-10-CM

## 2015-11-15 DIAGNOSIS — I950 Idiopathic hypotension: Secondary | ICD-10-CM | POA: Diagnosis not present

## 2015-11-15 DIAGNOSIS — R791 Abnormal coagulation profile: Secondary | ICD-10-CM | POA: Diagnosis not present

## 2015-11-15 DIAGNOSIS — J80 Acute respiratory distress syndrome: Secondary | ICD-10-CM

## 2015-11-15 DIAGNOSIS — J90 Pleural effusion, not elsewhere classified: Secondary | ICD-10-CM | POA: Diagnosis not present

## 2015-11-15 DIAGNOSIS — R0902 Hypoxemia: Secondary | ICD-10-CM | POA: Diagnosis present

## 2015-11-15 DIAGNOSIS — E785 Hyperlipidemia, unspecified: Secondary | ICD-10-CM | POA: Diagnosis present

## 2015-11-15 DIAGNOSIS — E11649 Type 2 diabetes mellitus with hypoglycemia without coma: Secondary | ICD-10-CM | POA: Diagnosis present

## 2015-11-15 DIAGNOSIS — K219 Gastro-esophageal reflux disease without esophagitis: Secondary | ICD-10-CM | POA: Diagnosis present

## 2015-11-15 DIAGNOSIS — E8770 Fluid overload, unspecified: Secondary | ICD-10-CM

## 2015-11-15 DIAGNOSIS — R579 Shock, unspecified: Secondary | ICD-10-CM | POA: Diagnosis not present

## 2015-11-15 DIAGNOSIS — Z79899 Other long term (current) drug therapy: Secondary | ICD-10-CM

## 2015-11-15 DIAGNOSIS — R6521 Severe sepsis with septic shock: Secondary | ICD-10-CM | POA: Diagnosis not present

## 2015-11-15 DIAGNOSIS — Z789 Other specified health status: Secondary | ICD-10-CM | POA: Insufficient documentation

## 2015-11-15 DIAGNOSIS — R7401 Elevation of levels of liver transaminase levels: Secondary | ICD-10-CM

## 2015-11-15 LAB — COMPREHENSIVE METABOLIC PANEL
ALK PHOS: 177 U/L — AB (ref 38–126)
ALT: 59 U/L — AB (ref 14–54)
AST: 69 U/L — ABNORMAL HIGH (ref 15–41)
Albumin: 2 g/dL — ABNORMAL LOW (ref 3.5–5.0)
Anion gap: 9 (ref 5–15)
BILIRUBIN TOTAL: 1 mg/dL (ref 0.3–1.2)
BUN: 22 mg/dL — AB (ref 6–20)
CALCIUM: 9.1 mg/dL (ref 8.9–10.3)
CO2: 28 mmol/L (ref 22–32)
CREATININE: 3.17 mg/dL — AB (ref 0.44–1.00)
Chloride: 101 mmol/L (ref 101–111)
GFR calc non Af Amer: 12 mL/min — ABNORMAL LOW (ref >60)
GFR, EST AFRICAN AMERICAN: 14 mL/min — AB (ref >60)
GLUCOSE: 102 mg/dL — AB (ref 65–99)
Potassium: 4.4 mmol/L (ref 3.5–5.1)
SODIUM: 138 mmol/L (ref 135–145)
TOTAL PROTEIN: 4.4 g/dL — AB (ref 6.5–8.1)

## 2015-11-15 LAB — RAPID URINE DRUG SCREEN, HOSP PERFORMED
AMPHETAMINES: NOT DETECTED
BARBITURATES: NOT DETECTED
BENZODIAZEPINES: NOT DETECTED
Cocaine: NOT DETECTED
Opiates: POSITIVE — AB
TETRAHYDROCANNABINOL: NOT DETECTED

## 2015-11-15 LAB — URINALYSIS, ROUTINE W REFLEX MICROSCOPIC
Glucose, UA: 100 mg/dL — AB
KETONES UR: 15 mg/dL — AB
NITRITE: NEGATIVE
PH: 5 (ref 5.0–8.0)
Protein, ur: 30 mg/dL — AB
Specific Gravity, Urine: 1.025 (ref 1.005–1.030)

## 2015-11-15 LAB — I-STAT ARTERIAL BLOOD GAS, ED
Acid-Base Excess: 4 mmol/L — ABNORMAL HIGH (ref 0.0–2.0)
Bicarbonate: 29.9 mEq/L — ABNORMAL HIGH (ref 20.0–24.0)
O2 SAT: 100 %
TCO2: 31 mmol/L (ref 0–100)
pCO2 arterial: 45.2 mmHg — ABNORMAL HIGH (ref 35.0–45.0)
pH, Arterial: 7.423 (ref 7.350–7.450)
pO2, Arterial: 179 mmHg — ABNORMAL HIGH (ref 80.0–100.0)

## 2015-11-15 LAB — I-STAT CHEM 8, ED
BUN: 23 mg/dL — ABNORMAL HIGH (ref 6–20)
CALCIUM ION: 1.19 mmol/L (ref 1.12–1.23)
CREATININE: 2.8 mg/dL — AB (ref 0.44–1.00)
Chloride: 96 mmol/L — ABNORMAL LOW (ref 101–111)
Glucose, Bld: 160 mg/dL — ABNORMAL HIGH (ref 65–99)
HEMATOCRIT: 34 % — AB (ref 36.0–46.0)
HEMOGLOBIN: 11.6 g/dL — AB (ref 12.0–15.0)
Potassium: 4.1 mmol/L (ref 3.5–5.1)
SODIUM: 137 mmol/L (ref 135–145)
TCO2: 30 mmol/L (ref 0–100)

## 2015-11-15 LAB — URINE MICROSCOPIC-ADD ON

## 2015-11-15 LAB — CBC WITH DIFFERENTIAL/PLATELET
BASOS ABS: 0.1 10*3/uL (ref 0.0–0.1)
Basophils Relative: 1 %
EOS ABS: 0 10*3/uL (ref 0.0–0.7)
Eosinophils Relative: 0 %
HCT: 28.8 % — ABNORMAL LOW (ref 36.0–46.0)
Hemoglobin: 9.5 g/dL — ABNORMAL LOW (ref 12.0–15.0)
LYMPHS ABS: 1.3 10*3/uL (ref 0.7–4.0)
Lymphocytes Relative: 11 %
MCH: 29.5 pg (ref 26.0–34.0)
MCHC: 33 g/dL (ref 30.0–36.0)
MCV: 89.4 fL (ref 78.0–100.0)
Monocytes Absolute: 0.7 10*3/uL (ref 0.1–1.0)
Monocytes Relative: 6 %
NEUTROS ABS: 10 10*3/uL — AB (ref 1.7–7.7)
Neutrophils Relative %: 82 %
Platelets: 93 10*3/uL — ABNORMAL LOW (ref 150–400)
RBC: 3.22 MIL/uL — ABNORMAL LOW (ref 3.87–5.11)
RDW: 22.8 % — AB (ref 11.5–15.5)
WBC: 12.1 10*3/uL — ABNORMAL HIGH (ref 4.0–10.5)

## 2015-11-15 LAB — PROTIME-INR
INR: 1.44 (ref 0.00–1.49)
PROTHROMBIN TIME: 17.7 s — AB (ref 11.6–15.2)

## 2015-11-15 LAB — GLUCOSE, CAPILLARY
Glucose-Capillary: 175 mg/dL — ABNORMAL HIGH (ref 65–99)
Glucose-Capillary: 54 mg/dL — ABNORMAL LOW (ref 65–99)

## 2015-11-15 LAB — CBG MONITORING, ED: GLUCOSE-CAPILLARY: 162 mg/dL — AB (ref 65–99)

## 2015-11-15 LAB — LIPASE, BLOOD: LIPASE: 31 U/L (ref 11–51)

## 2015-11-15 LAB — MRSA PCR SCREENING: MRSA by PCR: NEGATIVE

## 2015-11-15 LAB — BRAIN NATRIURETIC PEPTIDE: B Natriuretic Peptide: 2539.7 pg/mL — ABNORMAL HIGH (ref 0.0–100.0)

## 2015-11-15 MED ORDER — PENTAFLUOROPROP-TETRAFLUOROETH EX AERO: 1.0000 "application " | INHALATION_SPRAY | CUTANEOUS | Status: DC | PRN

## 2015-11-15 MED ORDER — DEXTROSE 50 % IV SOLN
INTRAVENOUS | Status: AC
  Administered 2015-11-15: 50 g
  Filled 2015-11-15: qty 50

## 2015-11-15 MED ORDER — HEPARIN SODIUM (PORCINE) 1000 UNIT/ML DIALYSIS: 1000.0000 [IU] | INTRAMUSCULAR | Status: DC | PRN

## 2015-11-15 MED ORDER — ALBUMIN HUMAN 25 % IV SOLN
INTRAVENOUS | Status: AC
  Administered 2015-11-15: 50 g via INTRAVENOUS
  Filled 2015-11-15: qty 200

## 2015-11-15 MED ORDER — SODIUM CHLORIDE 0.9 % IV SOLN: 100.0000 mL | INTRAVENOUS | Status: DC | PRN

## 2015-11-15 MED ORDER — ALBUMIN HUMAN 25 % IV SOLN
25.0000 g | INTRAVENOUS | Status: DC | PRN
  Administered 2015-11-15 (×2): 25 g via INTRAVENOUS
  Filled 2015-11-15: qty 100

## 2015-11-15 MED ORDER — LIDOCAINE HCL (PF) 1 % IJ SOLN: 5.0000 mL | INTRAMUSCULAR | Status: DC | PRN

## 2015-11-15 MED ORDER — HEPARIN SODIUM (PORCINE) 1000 UNIT/ML DIALYSIS: 20.0000 [IU]/kg | INTRAMUSCULAR | Status: DC | PRN

## 2015-11-15 MED ORDER — VANCOMYCIN HCL IN DEXTROSE 750-5 MG/150ML-% IV SOLN: 750.0000 mg | INTRAVENOUS | Status: DC

## 2015-11-15 MED ORDER — ALBUMIN HUMAN 25 % IV SOLN
50.0000 g | Freq: Once | INTRAVENOUS | Status: AC
  Administered 2015-11-15: 50 g via INTRAVENOUS

## 2015-11-15 MED ORDER — ALBUMIN HUMAN 25 % IV SOLN
INTRAVENOUS | Status: AC
  Filled 2015-11-15: qty 50

## 2015-11-15 MED ORDER — ALBUMIN HUMAN 25 % IV SOLN
INTRAVENOUS | Status: AC
  Administered 2015-11-15: 25 g via INTRAVENOUS
  Filled 2015-11-15: qty 100

## 2015-11-15 MED ORDER — PHENYLEPHRINE HCL 10 MG/ML IJ SOLN
30.0000 ug/min | INTRAVENOUS | Status: DC
  Administered 2015-11-15: 30 ug/min via INTRAVENOUS
  Administered 2015-11-16 (×2): 200 ug/min via INTRAVENOUS
  Administered 2015-11-16 (×2): 120 ug/min via INTRAVENOUS
  Administered 2015-11-16: 100 ug/min via INTRAVENOUS
  Administered 2015-11-16: 120 ug/min via INTRAVENOUS
  Administered 2015-11-16: 200 ug/min via INTRAVENOUS
  Administered 2015-11-16: 120 ug/min via INTRAVENOUS
  Filled 2015-11-15 (×10): qty 1

## 2015-11-15 MED ORDER — VANCOMYCIN HCL 10 G IV SOLR
1250.0000 mg | Freq: Once | INTRAVENOUS | Status: AC
  Administered 2015-11-15: 1250 mg via INTRAVENOUS
  Filled 2015-11-15 (×2): qty 1250

## 2015-11-15 MED ORDER — ALTEPLASE 2 MG IJ SOLR
2.0000 mg | Freq: Once | INTRAMUSCULAR | Status: DC | PRN
  Filled 2015-11-15: qty 2

## 2015-11-15 MED ORDER — SODIUM CHLORIDE 0.9% FLUSH
3.0000 mL | Freq: Two times a day (BID) | INTRAVENOUS | Status: DC
  Administered 2015-11-15 – 2015-11-18 (×4): 3 mL via INTRAVENOUS

## 2015-11-15 MED ORDER — VANCOMYCIN HCL IN DEXTROSE 1-5 GM/200ML-% IV SOLN: 1000.0000 mg | Freq: Once | INTRAVENOUS | Status: DC

## 2015-11-15 MED ORDER — LIDOCAINE-PRILOCAINE 2.5-2.5 % EX CREA: 1.0000 "application " | TOPICAL_CREAM | CUTANEOUS | Status: DC | PRN

## 2015-11-15 MED ORDER — DEXTROSE 5 % IV SOLN
2.0000 g | Freq: Once | INTRAVENOUS | Status: AC
  Administered 2015-11-15: 2 g via INTRAVENOUS
  Filled 2015-11-15: qty 2

## 2015-11-15 MED ORDER — DEXTROSE 5 % IV SOLN
2.0000 g | INTRAVENOUS | Status: DC
  Administered 2015-11-16 – 2015-11-18 (×2): 2 g via INTRAVENOUS
  Filled 2015-11-15 (×2): qty 2

## 2015-11-15 MED ORDER — SODIUM CHLORIDE 0.9 % IV SOLN
250.0000 mL | INTRAVENOUS | Status: DC | PRN
  Administered 2015-11-15: 250 mL via INTRAVENOUS

## 2015-11-15 MED ORDER — HEPARIN SODIUM (PORCINE) 5000 UNIT/ML IJ SOLN
5000.0000 [IU] | Freq: Three times a day (TID) | INTRAMUSCULAR | Status: DC
  Administered 2015-11-15 – 2015-11-19 (×9): 5000 [IU] via SUBCUTANEOUS
  Filled 2015-11-15 (×10): qty 1

## 2015-11-15 NOTE — ED Provider Notes (Signed)
CSN: [id number]     Arrival date & time Nov 19, 2015  1035 History   First MD Initiated Contact with Patient Nov 19, 2015 1048     Chief Complaint  Patient presents with  . Altered Mental Status  . Hypotension  . Fatigue     (Consider location/radiation/quality/duration/timing/severity/associated sxs/prior Treatment) HPI Level V caveat applies. She presents by EMS for altered mental status. Per EMS patient went to have her hemodialysis and found to be lethargic. Paramedics were called for transport. No family at bedside. Noted to be hypotensive en route.   Past Medical History  Diagnosis Date  . Chest pain 07/07/10  . Hypertension   . Edema 07/24/10    of ankles  . Generalized headaches 03/12  . Lightheadedness 07/24/10  . Reflux   . Fatigue   . Arthritis   . Gout   . Hyperglycemia     Borderline diabetes  . Peptic ulcer disease   . AF (paroxysmal atrial fibrillation) (HCC)     Used to be on Warfarin in the past but was taken off due to falls, memory problems.  . CHF (congestive heart failure) (HCC)     30 years ago  . GERD (gastroesophageal reflux disease)   . Diabetes mellitus without complication (HCC)     borderline    . End stage renal disease on dialysis [facility]) since 2010    related to Vioxx, HD T-TH-SAT   Past Surgical History  Procedure Laterality Date  . Hemodialysis catheter      & Placement left forearm AV graft   . Arteriovenous graft placement    . Abdominal hysterectomy    . Cholecystectomy    . Eye surgery      bilateral cataracts  . Revision of arteriovenous goretex graft Left 01/06/2013    Procedure: REVISION OF ARTERIOVENOUS GORETEX GRAFT;  Surgeon: Chuck Hint, MD;  Location: [facility] OR;  Service: Vascular;  Laterality: Left;   Family History  Problem Relation Age of Onset  . Cancer Father 49    throat cancer  . Other Mother     mother died from burns  . Kidney disease Sister   . CAD Neg Hx   . Diabetes Sister   . Diabetes Brother   .  Diabetes Mother    Social History  Substance Use Topics  . Smoking status: Former Smoker -- 2 years    Types: Cigarettes    Quit date: 04/28/1979  . Smokeless tobacco: Never Used     Comment: rare - 1-2x/week, quit 30 yrs ago  . Alcohol Use: No   OB History    No data available     Review of Systems  Unable to perform ROS: Mental status change      Allergies  Sulfa antibiotics; Aminophylline; Other; Clarithromycin; Ezetimibe-simvastatin; Penicillins; Propoxyphene; and Rofecoxib  Home Medications   Prior to Admission medications   Medication Sig Start Date End Date Taking? Authorizing Provider  acetaminophen (TYLENOL) 500 MG tablet Take 2 tablets (1,000 mg total) by mouth every 8 (eight) hours. 11/03/15  Yes Carly Arlyce Harman, MD  allopurinol (ZYLOPRIM) 100 MG tablet Take 100 mg by mouth at bedtime.    Yes Historical Provider, MD  aspirin EC 81 MG tablet Take 81 mg by mouth daily.   Yes Historical Provider, MD  cinacalcet (SENSIPAR) 30 MG tablet Take 30 mg by mouth at bedtime.   Yes Historical Provider, MD  clopidogrel (PLAVIX) 75 MG tablet Take 75 mg by mouth daily.  Yes Historical Provider, MD  loratadine (CLARITIN) 10 MG tablet Take 10 mg by mouth at bedtime.   Yes Historical Provider, MD  midodrine (PROAMATINE) 10 MG tablet Take 1 tablet (10 mg total) by mouth 2 (two) times daily. Patient taking differently: Take 10 mg by mouth 2 (two) times daily. 9am, 5pm 11/10/15  Yes Su Hoffarly J Rivet, MD  multivitamin (RENA-VIT) TABS tablet Take 1 tablet by mouth at bedtime.   Yes Historical Provider, MD  NUTRITIONAL SUPPLEMENT LIQD Take 240 mLs by mouth at bedtime. [employer]   Yes Historical Provider, MD  Nutritional Supplements (FEEDING SUPPLEMENT, NEPRO CARB STEADY,) LIQD Take 237 mLs by mouth 3 (three) times daily with meals. 11/03/15  Yes Carly Arlyce HarmanJ Rivet, MD  pyridOXINE (VITAMIN B-6) 100 MG tablet Take 100 mg by mouth at bedtime.    Yes Historical Provider, MD  thiamine (VITAMIN B-1) 100 MG  tablet Take 100 mg by mouth at bedtime.    Yes Historical Provider, MD  traMADol (ULTRAM) 50 MG tablet Take 50 mg by mouth 3 (three) times daily as needed (pain).   Yes Historical Provider, MD  lidocaine (XYLOCAINE) 2 % jelly Apply 1 application topically 3 (three) times daily. Patient taking differently: Apply 1 application topically 3 (three) times daily. Apply to left side ribs 11/10/15   Carly J Rivet, MD   BP 105/71 mmHg  Pulse 86  Temp(Src) 96.4 F (35.8 C) (Rectal)  Resp 19  SpO2 100% Physical Exam  Constitutional: She appears well-developed and well-nourished. No distress.  Slumped to her right side  HENT:  Head: Normocephalic and atraumatic.  Dry mucous membranes  Eyes: EOM are normal. Pupils are equal, round, and reactive to light.  Neck: Normal range of motion. Neck supple. JVD present.  No meningismus.  Cardiovascular: Regular rhythm.   Irregularly irregular  Pulmonary/Chest: Effort normal. No respiratory distress. She has no wheezes. She has rales.  Few crackles bilateral bases  Abdominal: Soft. Bowel sounds are normal. She exhibits no distension and no mass. There is no tenderness. There is no rebound and no guarding.  Musculoskeletal: Normal range of motion. She exhibits edema. She exhibits no tenderness.  Diffuse 3+ bilateral upper and lower extremity edema. There is a palpable thrill in the left forearm.  Neurological:  Patient is lethargic. Responsive to stimuli. Oriented to person. Moving bilateral upper and right lower extremity. Limited movement of left lower extremity. Difficult to obtain a thorough neurologic exam due to altered mental status.  Skin: Skin is warm and dry. No rash noted. No erythema.  Psychiatric: She has a normal mood and affect. Her behavior is normal.  Nursing note and vitals reviewed.   ED Course  Procedures (including critical care time) Labs Review Labs Reviewed  URINALYSIS, ROUTINE W REFLEX MICROSCOPIC (NOT AT [facility]) - Abnormal;  Notable for the following:    Color, Urine RED (*)    APPearance CLOUDY (*)    Glucose, UA 100 (*)    Hgb urine dipstick SMALL (*)    Bilirubin Urine SMALL (*)    Ketones, ur 15 (*)    Protein, ur 30 (*)    Leukocytes, UA SMALL (*)    All other components within normal limits  URINE RAPID DRUG SCREEN, HOSP PERFORMED - Abnormal; Notable for the following:    Opiates POSITIVE (*)    All other components within normal limits  URINE MICROSCOPIC-ADD ON - Abnormal; Notable for the following:    Squamous Epithelial / LPF 6-30 (*)  Bacteria, UA RARE (*)    All other components within normal limits  I-STAT CHEM 8, ED - Abnormal; Notable for the following:    Chloride 96 (*)    BUN 23 (*)    Creatinine, Ser 2.80 (*)    Glucose, Bld 160 (*)    Hemoglobin 11.6 (*)    HCT 34.0 (*)    All other components within normal limits  CBG MONITORING, ED - Abnormal; Notable for the following:    Glucose-Capillary 162 (*)    All other components within normal limits  I-STAT ARTERIAL BLOOD GAS, ED - Abnormal; Notable for the following:    pCO2 arterial 45.2 (*)    pO2, Arterial 179.0 (*)    Bicarbonate 29.9 (*)    Acid-Base Excess 4.0 (*)    All other components within normal limits  CULTURE, BLOOD (ROUTINE X 2)  CULTURE, BLOOD (ROUTINE X 2)  URINE CULTURE  COMPREHENSIVE METABOLIC PANEL  CBC WITH DIFFERENTIAL/PLATELET  BRAIN NATRIURETIC PEPTIDE  I-STAT CG4 LACTIC ACID, ED  I-STAT TROPOININ, ED  I-STAT CG4 LACTIC ACID, ED    Imaging Review Dg Chest Portable 1 View  December 06, 2015  CLINICAL DATA:  Dialysis.  Altered mental status. EXAM: PORTABLE CHEST 1 VIEW COMPARISON:  11/05/2015. FINDINGS: Cardiomegaly with bilateral pulmonary infiltrates consistent with pulmonary edema. Bilateral pleural effusions are noted. Similar findings noted on prior exam. No pneumothorax. Left axillary stent noted. The stent appears angulated. No acute bony abnormality. IMPRESSION: Congestive heart failure with bilateral  pulmonary edema and bilateral pleural effusions. Similar findings noted on prior study of 11/05/2015. Electronically Signed   By: Maisie Fus  Register   On: 12/06/15 11:36   I have personally reviewed and evaluated these images and lab results as part of my medical decision-making.   EKG Interpretation   Date/Time:  Friday 12/06/15 11:05:34 EDT Ventricular Rate:  91 PR Interval:    QRS Duration: 84 QT Interval:  466 QTC Calculation: 558 R Axis:   -22 Text Interpretation:  Unknown rhythm, irregular rate Borderline left axis  deviation Low voltage, extremity and precordial leads Prolonged QT  interval Confirmed by Ranae Palms  MD, Jax Kentner ([id number]) on December 06, 2015 11:15:29  AM      MDM   Final diagnoses:  Lethargy  Hypervolemia, unspecified hypervolemia type  Hypotension, unspecified hypotension type    Hypotension has resolved. Appears to be at her baseline blood pressure. Patient remains lethargic. Patient takes tramadol and Tylenol No. 3 for pain. Unable to obtain peripheral access after multiple attempts. Discussed with internal medicine teaching service. They are very familiar with the patient. Will see the patient in the emergency department and admit.    Loren Racer, MD December 06, 2015 1435

## 2015-11-15 NOTE — ED Notes (Signed)
CBG 162 

## 2015-11-15 NOTE — ED Notes (Signed)
IV Team at bedside with patient.

## 2015-11-15 NOTE — Consult Note (Signed)
PULMONARY / CRITICAL CARE MEDICINE   Name: [name] MRN: [id number] DOB: Feb 07, 1928    ADMISSION DATE:  11/10/2015 CONSULTATION DATE:  11/05/2015  REFERRING MD:  Chiquita Loth  CHIEF COMPLAINT:  Altered mental status  HISTORY OF PRESENT ILLNESS:   [name] is an 82yo F w/ hx ESRD on HD MWF, a fib, HFpEF, HLD, who presented to the ED from her SNF with complaints of altered mental status. She was recently admitted from 7/7-7/10 after a fall and again from 7/11-7/17 for sepsis 2/2 UTI. She had similar symptoms of altered mental status and borderline blood pressures that admission. She is chronically hypotensive, with blood pressures in the 46K systolic. She was started on midodrine 10 BID last admission. She is unable to give any history today - just tells me her name and birthdate but can't say much more. She moans with any movement and cannot localize pain.   PAST MEDICAL HISTORY :  She  has a past medical history of Chest pain (07/07/10); Hypertension; Edema (07/24/10); Generalized headaches (03/12); Lightheadedness (07/24/10); Reflux; Fatigue; Arthritis; Gout; Hyperglycemia; Peptic ulcer disease; AF (paroxysmal atrial fibrillation) (HCC); CHF (congestive heart failure) ([place]); GERD (gastroesophageal reflux disease); Diabetes mellitus without complication ([place]); and End stage renal disease on dialysis ([place]) (since 2010).  PAST SURGICAL HISTORY: She  has past surgical history that includes Hemodialysis catheter; Arteriovenous graft placement; Abdominal hysterectomy; Cholecystectomy; Eye surgery; and Revision of arteriovenous goretex graft (Left, 01/06/2013).  Allergies  Allergen Reactions  . Sulfa Antibiotics Anaphylaxis, Itching and Rash  . Aminophylline Itching  . Other Itching    Sometimes reacts to binders in generic drugs - can be treated with claritin  . Clarithromycin Itching and Rash  . Ezetimibe-Simvastatin Rash  . Penicillins Rash    Has patient had a PCN reaction causing  immediate rash, facial/tongue/throat swelling, SOB or lightheadedness with hypotension: Yes Has patient had a PCN reaction causing severe rash involving mucus membranes or skin necrosis: No Has patient had a PCN reaction that required hospitalization unknown Has patient had a PCN reaction occurring within the last 10 years: No If all of the above answers are "NO", then may proceed with Cephalosporin use.  Marland Kitchen Propoxyphene Itching  . Rofecoxib Rash    No current facility-administered medications on file prior to encounter.   Current Outpatient Prescriptions on File Prior to Encounter  Medication Sig  . acetaminophen (TYLENOL) 500 MG tablet Take 2 tablets (1,000 mg total) by mouth every 8 (eight) hours.  Marland Kitchen allopurinol (ZYLOPRIM) 100 MG tablet Take 100 mg by mouth at bedtime.   Marland Kitchen aspirin EC 81 MG tablet Take 81 mg by mouth daily.  . cinacalcet (SENSIPAR) 30 MG tablet Take 30 mg by mouth at bedtime.  . clopidogrel (PLAVIX) 75 MG tablet Take 75 mg by mouth daily.   Marland Kitchen loratadine (CLARITIN) 10 MG tablet Take 10 mg by mouth at bedtime.  . midodrine (PROAMATINE) 10 MG tablet Take 1 tablet (10 mg total) by mouth 2 (two) times daily. (Patient taking differently: Take 10 mg by mouth 2 (two) times daily. 9am, 5pm)  . multivitamin (RENA-VIT) TABS tablet Take 1 tablet by mouth at bedtime.  . Nutritional Supplements (FEEDING SUPPLEMENT, NEPRO CARB STEADY,) LIQD Take 237 mLs by mouth 3 (three) times daily with meals.  . pyridOXINE (VITAMIN B-6) 100 MG tablet Take 100 mg by mouth at bedtime.   . thiamine (VITAMIN B-1) 100 MG tablet Take 100 mg by mouth at bedtime.   . lidocaine (XYLOCAINE)  2 % jelly Apply 1 application topically 3 (three) times daily. (Patient taking differently: Apply 1 application topically 3 (three) times daily. Apply to left side ribs)    FAMILY HISTORY:  Her indicated that her mother is deceased. She indicated that her father is deceased. She indicated that her sister is deceased. She  indicated that both of her brothers are deceased.   SOCIAL HISTORY: She  reports that she quit smoking about 36 years ago. Her smoking use included Cigarettes. She quit after 2 years of use. She has never used smokeless tobacco. She reports that she does not drink alcohol or use illicit drugs.  REVIEW OF SYSTEMS:   Unable to obtain 2/2 altered mental status  SUBJECTIVE:    VITAL SIGNS: BP 74/56 mmHg  Pulse 131  Temp(Src) 94.3 F (34.6 C) (Oral)  Resp 17  Wt   SpO2 99%  HEMODYNAMICS:    VENTILATOR SETTINGS:    INTAKE / OUTPUT:    PHYSICAL EXAMINATION:  General Well nourished, well developed, no apparent distress  HEENT No gross abnormalities. Oropharynx clear. Mallampati III. adequate dentition.   Pulmonary Clear to auscultation bilaterally with no wheezes, rales or ronchi on anterior exam. Good effort, symmetrical expansion.   Cardiovascular Irregularly irregular rhythm with occasional runs in the 130s, usually 110s. S1, s2. No m/r/g. Distal pulses palpable.  Abdomen Soft, non-tender, non-distended, positive bowel sounds, no palpable organomegaly or masses. Normoresonant to percussion.  Musculoskeletal Grossly normal. HD access LUE (loop graft)  Lymphatics No cervical, supraclavicular or axillary adenopathy.   Neurologic Grossly intact. No focal deficits.   Skin/Integuement No rash, no cyanosis, no clubbing. Diffuse anasarca with 2-3+ pitting edema of all extremities. Bruising noted on forearms.     LABS:  BMET  Recent Labs Lab 11/09/15 1331 11/10/15 1030 11/11/2015 1118 11/20/2015 1826  NA 132* 133* 137 138  K 2.7* 5.7* 4.1 4.4  CL 97* 99* 96* 101  CO2 27 26  --  28  BUN 5* 8 23* 22*  CREATININE 1.69* 2.14* 2.80* 3.17*  GLUCOSE 101* 95 160* 102*    Electrolytes  Recent Labs Lab 11/09/15 1331 11/10/15 1030 10/28/2015 1826  CALCIUM 7.6* 7.9* 9.1  PHOS 1.3*  --   --     CBC  Recent Labs Lab 11/10/15 1030 11/17/2015 1118 10/29/2015 1826  WBC 7.7  --   12.1*  HGB 9.2* 11.6* 9.5*  HCT 27.5* 34.0* 28.8*  PLT 74*  --  93*    Coag's  Recent Labs Lab 11/12/2015 1827  INR 1.44    Sepsis Markers No results for input(s): LATICACIDVEN, PROCALCITON, O2SATVEN in the last 168 hours.  ABG  Recent Labs Lab 11/11/2015 1116  PHART 7.423  PCO2ART 45.2*  PO2ART 179.0*    Liver Enzymes  Recent Labs Lab 11/09/15 1331 11/14/2015 1826  AST  --  69*  ALT  --  59*  ALKPHOS  --  177*  BILITOT  --  1.0  ALBUMIN 2.5* 2.0*    Cardiac Enzymes No results for input(s): TROPONINI, PROBNP in the last 168 hours.  Glucose  Recent Labs Lab 11/06/2015 1102  GLUCAP 162*    Imaging Dg Chest Portable 1 View  11/25/2015  CLINICAL DATA:  Dialysis.  Altered mental status. EXAM: PORTABLE CHEST 1 VIEW COMPARISON:  11/05/2015. FINDINGS: Cardiomegaly with bilateral pulmonary infiltrates consistent with pulmonary edema. Bilateral pleural effusions are noted. Similar findings noted on prior exam. No pneumothorax. Left axillary stent noted. The stent appears angulated. No acute bony  abnormality. IMPRESSION: Congestive heart failure with bilateral pulmonary edema and bilateral pleural effusions. Similar findings noted on prior study of 11/05/2015. Electronically Signed   By: [place]   On: 10/28/2015 11:36     STUDIES:  CT head pending.  CXR as above TTE from last admission with preserved EF.   CULTURES: Blood cx pending 7/21 >> Urine cx pending 7/21 >>  ANTIBIOTICS: Ceftaz 7/21 >> Vancomycin 7/21 >>  SIGNIFICANT EVENTS:   LINES/TUBES: PIV  DISCUSSION: Ms. Pudlo is an 46QK AAF with complicated past medical history who presents with altered mental status and anasarca. The admitting team is concerned for sepsis of unclear source. She does have new elevations of AST, ALT and AlkP, but not bilirubin. Her urine is unimpressive and she just completed treatment for UTI a few days ago. Her CXR is consistent with pulmonary edema, but infection  cannot be ruled out. She went for HD and became more hypotensive with MAPs in the 50s. While her mentation did not change, she was not felt to be stable for the floor or stepdown and ICU transfer was requested.   ASSESSMENT / PLAN:  PULMONARY A: Hypoxemia 2/2 pulmonary edema +/- infection P:   Continue supplemental O2 as needed for sats > 92% Continue empiric antibiotics Continued efforts at volume removal   CARDIOVASCULAR A:  Hypotension - chronic Paroxysmal A fib P:  A-line for more accurate BP monitoring Continue home midodrine Rate control goal HR <120 Will hold off on pressor while MAP > 50 unless evidence of hypoperfusion  RENAL A:   ESRD on iHD MWF P:   Continue dialysis as scheduled May require CRRT for volume removal  GASTROINTESTINAL A:   Elevated transaminases, alk phos Hx GERD P:   RUQ ultrasound Continue empiric abx Check lipase Repeat LFTs in AM  HEMATOLOGIC A:   Coagulopathy P:  Trend INR SCDs for dvt ppx for now  INFECTIOUS A:   Concern for sepsis - source unclear P:   Continue empiric antibiotics pending further workup Follow cultures  ENDOCRINE A:   Hx DMII P:   Accuchecks / SSI  NEUROLOGIC A:   Encephalopathy - likely toxic metabolic, possibly related to underlying infection P:   RASS goal:0 High risk for delirium - monitor   FAMILY  - Updates: No family available  - Inter-disciplinary family meet or Palliative Care meeting due by:  day 7  The patient is critically ill with multiple organ system failure and requires high complexity decision making for assessment and support, frequent evaluation and titration of therapies, advanced monitoring, review of radiographic studies and interpretation of complex data.   Critical Care Time devoted to patient care services, exclusive of separately billable procedures, described in this note is 35 minutes.   Yisroel Ramming, MD Pulmonary and [place] Pager: [phone]  11/21/2015, 8:34 PM

## 2015-11-15 NOTE — ED Notes (Signed)
CT delayed due to patient not having IV access.

## 2015-11-15 NOTE — ED Notes (Signed)
At bedside with Dr. Ranae PalmsYelverton for multiple IV attempts.

## 2015-11-15 NOTE — H&P (Signed)
Date: 11/14/2015               Patient Name:  [name] MRN: [id number]  DOB: 1927-10-01 Age / Sex: 82 y.o., female   PCP: No primary care provider on file.         Medical Service: Internal Medicine Teaching Service         Attending Physician: Dr. Doneen Poisson, MD    First Contact: Dr. Arnetha Courser Pager: [phone]  Second Contact: Dr.Carly Rivet Pager: [phone]       After Hours (After 5p/  First Contact Pager: [phone]  weekends / holidays): Second Contact Pager: [phone]   Chief Complaint: Altered mental Status  History of Present Illness: [name] is an 82 year old woman with a history of ESRD requiring hemodialysis(M/W/F), hypertension, hyperlipidemia, and paroxysmal atrial fibrillation came to ED with altered mental status. She was recently discharged after her second admission from the hospital on 11/11/15. She was in her usual state of health till 11/01/15 when she had a fall and fractured her left 9 & 10 th rib. D/C on 7/10 in a stable condition, came back on 7/11 with altered mental status and hypotension, apparently family was giving her more tylenol # 3 due to her pain. Her mental status and BP improved with Midorine 10 mg BID and she was discharged to SNF on 7/17. She went for her regular HD today where they found her hypotensive with altered mental status and sent her to ED with EMS. She was unable to provide any history, which was obtained from her grand daughter.  Meds: No current facility-administered medications for this encounter.   Current Outpatient Prescriptions  Medication Sig Dispense Refill  . acetaminophen (TYLENOL) 500 MG tablet Take 2 tablets (1,000 mg total) by mouth every 8 (eight) hours. 30 tablet 0  . allopurinol (ZYLOPRIM) 100 MG tablet Take 100 mg by mouth at bedtime.     Marland Kitchen aspirin EC 81 MG tablet Take 81 mg by mouth daily.    . cinacalcet (SENSIPAR) 30 MG tablet Take 30 mg by mouth at bedtime.    . clopidogrel (PLAVIX) 75 MG tablet  Take 75 mg by mouth daily.     Marland Kitchen loratadine (CLARITIN) 10 MG tablet Take 10 mg by mouth at bedtime.    . midodrine (PROAMATINE) 10 MG tablet Take 1 tablet (10 mg total) by mouth 2 (two) times daily. (Patient taking differently: Take 10 mg by mouth 2 (two) times daily. 9am, 5pm) 60 tablet 1  . multivitamin (RENA-VIT) TABS tablet Take 1 tablet by mouth at bedtime.    Marland Kitchen NUTRITIONAL SUPPLEMENT LIQD Take 240 mLs by mouth at bedtime. [employer]    . Nutritional Supplements (FEEDING SUPPLEMENT, NEPRO CARB STEADY,) LIQD Take 237 mLs by mouth 3 (three) times daily with meals. 30 Can 0  . pyridOXINE (VITAMIN B-6) 100 MG tablet Take 100 mg by mouth at bedtime.     . thiamine (VITAMIN B-1) 100 MG tablet Take 100 mg by mouth at bedtime.     . traMADol (ULTRAM) 50 MG tablet Take 50 mg by mouth 3 (three) times daily as needed (pain).    Marland Kitchen lidocaine (XYLOCAINE) 2 % jelly Apply 1 application topically 3 (three) times daily. (Patient taking differently: Apply 1 application topically 3 (three) times daily. Apply to left side ribs) 30 mL 0    Allergies: Allergies as of 10/26/2015 - Review Complete 11/03/2015  Allergen Reaction Noted  . Sulfa antibiotics Anaphylaxis, Itching, and  Rash 07/24/2010  . Aminophylline Itching 08/01/2012  . Other Itching 11/01/2015  . Clarithromycin Itching and Rash 07/24/2010  . Ezetimibe-simvastatin Rash 11/01/2015  . Penicillins Rash 07/24/2010  . Propoxyphene Itching 11/01/2015  . Rofecoxib Rash 11/01/2015   Past Medical History  Diagnosis Date  . Chest pain 07/07/10  . Hypertension   . Edema 07/24/10    of ankles  . Generalized headaches 03/12  . Lightheadedness 07/24/10  . Reflux   . Fatigue   . Arthritis   . Gout   . Hyperglycemia     Borderline diabetes  . Peptic ulcer disease   . AF (paroxysmal atrial fibrillation) (HCC)     Used to be on Warfarin in the past but was taken off due to falls, memory problems.  . CHF (congestive heart failure) (HCC)     30 years ago   . GERD (gastroesophageal reflux disease)   . Diabetes mellitus without complication (HCC)     borderline    . End stage renal disease on dialysis [facility](HCC) since 2010    related to Vioxx, HD T-TH-SAT    Family History:  Problem Relation Age of Onset  . Cancer Father 3177    throat cancer  . Other Mother     mother died from burns  . Kidney disease Sister   . CAD Neg Hx   . Diabetes Sister   . Diabetes Brother   . Diabetes Mother         Social History:   Widow Former Smoker: Quit 2 yrs ago No alcohol use No illicit drug use  Review of Systems: Unable to obtained as Pt. Was not responding, and grand daughter was not aware of anything except that when she saw her this morning she was sleeping all the time.  Physical Exam: Blood pressure 95/59, pulse 84, temperature 95.7 F (35.4 C), temperature source Rectal, resp. rate 22, SpO2 100 %.   Filed Vitals:   11/05/2015 1500 11/07/2015 1545 11/20/2015 1600 10/30/2015 1623  BP: 103/74 95/59 105/64   Pulse:  84    Temp:    95.7 F (35.4 C)  TempSrc:    Rectal  Resp: 13 22 19    SpO2:  100%     General: Vital signs reviewed.  Patient is drowsy,unable to follow command and open her eyes when called for a very short period if time. Dry mucous membranes. Head: Normocephalic and atraumatic. Eyes: EOMI, conjunctivae normal, no scleral icterus.  Neck: Supple, trachea midline, no thyromegaly, or carotid bruit present.  Cardiovascular: Irregular,  no murmurs, gallops, or rubs. Pulmonary/Chest: Basal crackles? Hard to hear when pt. Was supine. Abdominal: Soft, non-tender, non-distended, BS +, no masses, organomegaly, or guarding present.  Extremities: Pitting edema lower extremity till her knees  bilaterally, Bilateral upper limb edema, pulses symmetric and intact bilaterally. No cyanosis or clubbing. Neurological: Sleeping, unable to follow command except telling her name.   Urinalysis    Component Value  Date/Time   COLORURINE RED* 11/06/2015 1128   APPEARANCEUR CLOUDY* 11/14/2015 1128   LABSPEC 1.025 11/20/2015 1128   PHURINE 5.0 11/02/2015 1128   GLUCOSEU 100* 11/17/2015 1128   HGBUR SMALL* 11/23/2015 1128   BILIRUBINUR SMALL* 11/20/2015 1128   KETONESUR 15* 11/14/2015 1128   PROTEINUR 30* 10/30/2015 1128   UROBILINOGEN 0.2 03/18/2009 1640   NITRITE NEGATIVE 11/04/2015 1128   LEUKOCYTESUR SMALL* 11/20/2015 1128   CBC Latest Ref Rng 11/07/2015 11/04/2015 11/10/2015  WBC 4.0 - 10.5 K/uL 12.1(H) - 7.7  Hemoglobin  12.0 - 15.0 g/dL 4.0(J) 11.6(L) 9.2(L)  Hematocrit 36.0 - 46.0 % 28.8(L) 34.0(L) 27.5(L)  Platelets 150 - 400 K/uL PENDING - 74(L)   CMP Latest Ref Rng 11/11/2015 11/12/2015 11/10/2015  Glucose 65 - 99 mg/dL 811(B) 147(W) 95  BUN 6 - 20 mg/dL 29(F) 62(Z) 8  Creatinine 0.44 - 1.00 mg/dL 3.08(M) 5.78(I) 6.96(E)  Sodium 135 - 145 mmol/L 138 137 133(L)  Potassium 3.5 - 5.1 mmol/L 4.4 4.1 5.7(H)  Chloride 101 - 111 mmol/L 101 96(L) 99(L)  CO2 22 - 32 mmol/L 28 - 26  Calcium 8.9 - 10.3 mg/dL 9.1 - 7.9(L)  Total Protein 6.5 - 8.1 g/dL 9.5(M) - -  Total Bilirubin 0.3 - 1.2 mg/dL 1.0 - -  Alkaline Phos 38 - 126 U/L 177(H) - -  AST 15 - 41 U/L 69(H) - -  ALT 14 - 54 U/L 59(H) - -     WUX:LKGM. rate 91 BPM PR interval * ms QRS duration 84 ms QT/QTc 466/558 ms P-R-T axes 0 -22 * Unknown rhythm, irregular rate Borderline left axis deviation Low voltage, extremity and precordial leads Prolonged QT interval  CXR:  FINDINGS: Cardiomegaly with bilateral pulmonary infiltrates consistent with pulmonary edema. Bilateral pleural effusions are noted. Similar findings noted on prior exam. No pneumothorax. Left axillary stent noted. The stent appears angulated. No acute bony abnormality.  IMPRESSION: Congestive heart failure with bilateral pulmonary edema and bilateral pleural effusions. Similar findings noted on prior study of 11/05/2015.  Assessment & Plan by  Problem: Altered Mental Status: Ms. Doten is an 82 year old woman with a history of ESRD requiring hemodialysis(M/W/F), hypertension, hyperlipidemia, and paroxysmal atrial fibrillation came to ED with altered mental status.She was found to be hypotensive(92/48) and hypothermic(95.7),and with her recent H/O pneumonia and UTI, Sepsis was out first differential. Unfortunately we are unable to get an IV excess on her even with multiple attempts, don't have any blood work up except ABG which shows Alkalosis? PCCM was consulted for central line placement. Her UA shows 6-39 WBC and UDS is positive for opioids. Opioids can be a possibility for her altered mental status and mild hypothermia. Stroke needs to rule out with CT head. Sepsis protocol . Blood cultures  Urine cultures BMP CBC Lactic acid Repeat  DG chest  Ceftazidime  Vancomycin  ESRD on Hd with fluid over load: She is volume overload with pulmonary congestion and bilateral upper and lower extremity edema. She didn't get her scheduled HD today due to her hypotension and altered mental status. Nephrology was consulted and they agreed to dialyze her. We will assess her mental status after dialysis.  Hypotension: Her base line is in 80-90 systolic. Sepsis and opioids both can cause worsening of her hypotension.She had relative bradycardia might be impending shock? Closely monitor her at step down after her HD.  Afib: She is having an irregular rhythm, she was taking Plavix at home. Hold currently due to her altered mental status.  Diet: NPO  Till she becomes more alert.  DVT: Heparin   Dispo: Admit patient to Inpatient with expected length of stay greater than 2 midnights.  Signed: Arnetha Courser, MD 11/04/2015, 4:31 PM  Pager: [phone]

## 2015-11-15 NOTE — ED Notes (Signed)
Dr. Precious ReelAmed called requesting that we try to D/C pt's O2 and monitor levels.  This was done and she was able to maintain an O2 level of 99% to 98%.  A rectal temp was done - 95.7.  MD paged.

## 2015-11-15 NOTE — ED Notes (Signed)
Patient lives at home arrived to the dialysis today who called EMS due to patient being lethargic and hypotensive.  EMS reported placed on NRB mask pulse oximetry range 80-98%.

## 2015-11-15 NOTE — Progress Notes (Signed)
Pharmacy Antibiotic Note  [name] is a 82 y.o. female admitted on 10/30/2015 with sepsis.  Pharmacy has been consulted for ceftazidime and vancomycin dosing. Pt has history of PCN allergy but has tolerated cephalosporins in the past. Pt has ESRD on HD (MWF) and was sent straight from HD 2/2 hypotension and AMS.  Pt received vancomycin 1250mg  and ceftazidime 2g IV once in the ED.  Plan: Vancomycin 750mg  IV qHD Ceftazidime 2g IV qHD Monitor culture data, HD plan and clinical course VT at SS prn (target pre-HD target level 15-25 mcg/mL)     Temp (24hrs), Avg:96.1 F (35.6 C), Min:95.7 F (35.4 C), Max:96.4 F (35.8 C)   Recent Labs Lab 11/09/15 1331 11/10/15 1030 11/12/2015 1118  WBC  --  7.7  --   CREATININE 1.69* 2.14* 2.80*    Estimated Creatinine Clearance: 13.2 mL/min (by C-G formula based on Cr of 2.8).    Allergies  Allergen Reactions  . Sulfa Antibiotics Anaphylaxis, Itching and Rash  . Aminophylline Itching  . Other Itching    Sometimes reacts to binders in generic drugs - can be treated with claritin  . Clarithromycin Itching and Rash  . Ezetimibe-Simvastatin Rash  . Penicillins Rash    Has patient had a PCN reaction causing immediate rash, facial/tongue/throat swelling, SOB or lightheadedness with hypotension: Yes Has patient had a PCN reaction causing severe rash involving mucus membranes or skin necrosis: No Has patient had a PCN reaction that required hospitalization unknown Has patient had a PCN reaction occurring within the last 10 years: No If all of the above answers are "NO", then may proceed with Cephalosporin use.  Marland Kitchen. Propoxyphene Itching  . Rofecoxib Rash    Antimicrobials this admission: Vanc 7/21 >>  Ceftaz 7/21 >>   Dose adjustments this admission: n/a  Microbiology results: 7/21 BCx: sent 7/21 UCx: sent   Sputum:    MRSA PCR:    Arlean Hoppingorey M. Newman PiesBall, PharmD, BCPS Clinical Pharmacist Pager [phone] 11/22/2015 4:51 PM

## 2015-11-15 NOTE — Progress Notes (Signed)
[facility]. Patient had transferred to HD by then.

## 2015-11-15 NOTE — Progress Notes (Signed)
Dialysis treatment terminated early per nephrologist due to non responsive hypotension.  397 mL ultrafiltrated and net fluid removal -103 mL.    Patient AMS, oriented to self only, lethargic. Lung sounds diminished, coarse to ausculation in all fields. Moderate +2 pitting edema, BLUE and BLLE edema. Cardiac: Afib, RVR now sustaining >120.  Disconnected lines and removed needles.  Pressure held for 10 minutes and band aid/gauze dressing applied.  Report given to CT.  Patient to transport to room post CT.    100g total albumin given during treatment.  Fortaz administered at end of treatment.

## 2015-11-15 NOTE — ED Notes (Signed)
Dr. Silverio LayYao unable to obtain IV access via EJ or peripheral.  Dr. Ranae PalmsYelverton made aware.  Family present.

## 2015-11-15 NOTE — ED Notes (Signed)
Admitting at bedside 

## 2015-11-15 NOTE — ED Notes (Signed)
Dr. Silverio LayYao at bedside for attempt of IV access.

## 2015-11-15 NOTE — Procedures (Signed)
Procedure: Ultrasound guided IV  11/13/2015 8:55 PM Indication: need for IV access  Description:  The patient's right upper extremity was examined with ultrasound. The right brachial vein was identified. The arm was cleansed with chlorhexidine. Under ultrasound guidance and using aseptic technique, an 18g iv was placed into the vein. Good blood return was noted and the access flushed easily. The catheter was secured.   No complications. EBL none  Nita SickleSarah Ellen E. Stephens, MD Pulmonary and Critical Care 11/25/2015 8:57 PM

## 2015-11-15 NOTE — Progress Notes (Signed)
Patient arrived to unit per ED stretcher.  Reviewed treatment plan and this RN agrees.  Report received from bedside RN, Victorino DikeJennifer.  Consent obtained.  Patient lethargic, oriented to self. Lung sounds diminished, coarse to ausculation in all fields. BUE 2+ pitting edema with weeping skin appreciated. Cardiac: Afib, some RVR at times.  Prepped LLAVG with alcohol and cannulated with two 15 gauge needles.  Pulsation of blood noted.  Flushed access well with saline per protocol.  Connected and secured lines and initiated tx at 1752.  UF goal of 3500 mL and net fluid removal of 3000 mL.  Will continue to monitor.

## 2015-11-15 NOTE — Procedures (Signed)
Arterial Catheter Insertion Procedure Note [name] [id number] 07/30/1927  Procedure: Insertion of Arterial Catheter - ultrasound guided Indications: Blood pressure monitoring  Procedure Details Consent: Risks of procedure as well as the alternatives and risks of each were explained to the (patient/caregiver).  Consent for procedure obtained. Time Out: Verified patient identification, verified procedure, site/side was marked, verified correct patient position, special equipment/implants available, medications/allergies/relevent history reviewed, required imaging and test results available.  Performed  Maximum sterile technique was used including antiseptics, cap, gloves, gown, hand hygiene, mask and sheet. Skin prep: Chlorhexidine;  Under ultrasound guidance, a 20 gauge catheter was inserted into right radial artery using the Seldinger technique.  Evaluation Blood flow good; BP tracing good. Complications: No apparent complications.  Nita SickleSarah Ellen E. Torey Regan, MD Pulmonary and Critical Care 10/27/2015 10:48 PM

## 2015-11-15 NOTE — ED Notes (Signed)
Difficult IV access, no IV with EMS arrival and IV Team consulted.  Patient has weeping from the right arm and left arm is restricted.

## 2015-11-15 NOTE — Progress Notes (Signed)
ABG obtained on 6L nasal cannula.  Results given to MD.  No further instructions at this time.   Ref. Range 11/10/2015 11:16  Sample type Unknown ARTERIAL  pH, Arterial Latest Ref Range: 7.350-7.450  7.423  pCO2 arterial Latest Ref Range: 35.0-45.0 mmHg 45.2 (H)  pO2, Arterial Latest Ref Range: 80.0-100.0 mmHg 179.0 (H)  Bicarbonate Latest Ref Range: 20.0-24.0 mEq/L 29.9 (H)  TCO2 Latest Ref Range: 0-100 mmol/L 31  Acid-Base Excess Latest Ref Range: 0.0-2.0 mmol/L 4.0 (H)  O2 Saturation Latest Units: % 100.0  Patient temperature Unknown 96.4 F  Collection site Unknown RADIAL, ALLEN'S T.Marland Kitchen..Marland Kitchen

## 2015-11-15 NOTE — ED Notes (Signed)
MD called and requested that RN D/C the O2 and monitor the pt's O2 level.  While it was difficult to get a reading using the pulse ox, we were able to get several O2 levels usually 99%.

## 2015-11-15 NOTE — ED Notes (Addendum)
Antonio Katrinka BlazingSmith (son) called to inquire about his mother.  Apparently he was here earlier and had to be excorted out due to his verbally aggressive behaviors towards the other visitors.  He inquired about a police report being filed after she fell (prior visit) however RN had not information about this.  He requested that RN ask MD to call him as he is concerned about her medications.

## 2015-11-15 NOTE — Progress Notes (Signed)
Physician notified of hyptension despite UF d/c and 25 g albumin administered.  Verbal order received to administer 50 g and monitor.  If patient BP does not improve in 30 minutes, this RN to d/c treatment.  Will continue to monitor.

## 2015-11-15 NOTE — ED Notes (Signed)
Phlebotomy at bedside with patient unable to obtain IV access.  Dr. Ranae PalmsYelverton made aware.  Ultrasound set up at bedside.

## 2015-11-15 NOTE — ED Notes (Signed)
Family was concerned about the safety if Lionel DecemberAntonio Smith returned, Spoke to [employer] Communications[employer] officer Mike who agreed that Mr. [name] should not be allowed back in the hospital b/c he heard Mr. [name] make the comment "both of them deserved to die".

## 2015-11-16 ENCOUNTER — Inpatient Hospital Stay (HOSPITAL_COMMUNITY): Payer: Medicare Other

## 2015-11-16 DIAGNOSIS — R6521 Severe sepsis with septic shock: Secondary | ICD-10-CM

## 2015-11-16 DIAGNOSIS — Z992 Dependence on renal dialysis: Secondary | ICD-10-CM

## 2015-11-16 DIAGNOSIS — Z789 Other specified health status: Secondary | ICD-10-CM | POA: Insufficient documentation

## 2015-11-16 DIAGNOSIS — D62 Acute posthemorrhagic anemia: Secondary | ICD-10-CM

## 2015-11-16 DIAGNOSIS — N186 End stage renal disease: Secondary | ICD-10-CM

## 2015-11-16 DIAGNOSIS — I953 Hypotension of hemodialysis: Secondary | ICD-10-CM

## 2015-11-16 DIAGNOSIS — A419 Sepsis, unspecified organism: Principal | ICD-10-CM

## 2015-11-16 LAB — CBC
HCT: 19.7 % — ABNORMAL LOW (ref 36.0–46.0)
HCT: 19.5 % — ABNORMAL LOW (ref 36.0–46.0)
HCT: 22.5 % — ABNORMAL LOW (ref 36.0–46.0)
Hemoglobin: 6.5 g/dL — CL (ref 12.0–15.0)
Hemoglobin: 6.5 g/dL — CL (ref 12.0–15.0)
Hemoglobin: 7.7 g/dL — ABNORMAL LOW (ref 12.0–15.0)
MCH: 30.3 pg (ref 26.0–34.0)
MCH: 30.5 pg (ref 26.0–34.0)
MCH: 30.8 pg (ref 26.0–34.0)
MCHC: 33.5 g/dL (ref 30.0–36.0)
MCHC: 33.3 g/dL (ref 30.0–36.0)
MCHC: 34.2 g/dL (ref 30.0–36.0)
MCV: 90.4 fL (ref 78.0–100.0)
MCV: 91.5 fL (ref 78.0–100.0)
MCV: 90 fL (ref 78.0–100.0)
PLATELETS: 71 10*3/uL — AB (ref 150–400)
PLATELETS: 75 10*3/uL — AB (ref 150–400)
Platelets: 72 10*3/uL — ABNORMAL LOW (ref 150–400)
RBC: 2.18 MIL/uL — AB (ref 3.87–5.11)
RBC: 2.13 MIL/uL — AB (ref 3.87–5.11)
RBC: 2.5 MIL/uL — ABNORMAL LOW (ref 3.87–5.11)
RDW: 22.7 % — AB (ref 11.5–15.5)
RDW: 23 % — AB (ref 11.5–15.5)
RDW: 20.5 % — ABNORMAL HIGH (ref 11.5–15.5)
WBC: 10.8 10*3/uL — ABNORMAL HIGH (ref 4.0–10.5)
WBC: 10.6 10*3/uL — AB (ref 4.0–10.5)
WBC: 12.1 10*3/uL — AB (ref 4.0–10.5)

## 2015-11-16 LAB — BASIC METABOLIC PANEL
Anion gap: 10 (ref 5–15)
BUN: 15 mg/dL (ref 6–20)
CHLORIDE: 94 mmol/L — AB (ref 101–111)
CO2: 24 mmol/L (ref 22–32)
Calcium: 8.8 mg/dL — ABNORMAL LOW (ref 8.9–10.3)
Creatinine, Ser: 2.51 mg/dL — ABNORMAL HIGH (ref 0.44–1.00)
GFR calc non Af Amer: 16 mL/min — ABNORMAL LOW (ref >60)
GFR, EST AFRICAN AMERICAN: 19 mL/min — AB (ref >60)
Glucose, Bld: 94 mg/dL (ref 65–99)
POTASSIUM: 3.7 mmol/L (ref 3.5–5.1)
SODIUM: 128 mmol/L — AB (ref 135–145)

## 2015-11-16 LAB — COMPREHENSIVE METABOLIC PANEL
ALBUMIN: 3.7 g/dL (ref 3.5–5.0)
ALT: 36 U/L (ref 14–54)
ANION GAP: 9 (ref 5–15)
AST: 56 U/L — ABNORMAL HIGH (ref 15–41)
Alkaline Phosphatase: 111 U/L (ref 38–126)
BUN: 13 mg/dL (ref 6–20)
CALCIUM: 8.9 mg/dL (ref 8.9–10.3)
CHLORIDE: 98 mmol/L — AB (ref 101–111)
CO2: 27 mmol/L (ref 22–32)
Creatinine, Ser: 2.15 mg/dL — ABNORMAL HIGH (ref 0.44–1.00)
GFR calc non Af Amer: 20 mL/min — ABNORMAL LOW (ref >60)
GFR, EST AFRICAN AMERICAN: 23 mL/min — AB (ref >60)
GLUCOSE: 173 mg/dL — AB (ref 65–99)
POTASSIUM: 3.4 mmol/L — AB (ref 3.5–5.1)
SODIUM: 134 mmol/L — AB (ref 135–145)
Total Bilirubin: 1.6 mg/dL — ABNORMAL HIGH (ref 0.3–1.2)
Total Protein: 5.2 g/dL — ABNORMAL LOW (ref 6.5–8.1)

## 2015-11-16 LAB — GLUCOSE, CAPILLARY
GLUCOSE-CAPILLARY: 33 mg/dL — AB (ref 65–99)
GLUCOSE-CAPILLARY: 61 mg/dL — AB (ref 65–99)
GLUCOSE-CAPILLARY: 165 mg/dL — AB (ref 65–99)
GLUCOSE-CAPILLARY: 99 mg/dL (ref 65–99)
Glucose-Capillary: 60 mg/dL — ABNORMAL LOW (ref 65–99)
Glucose-Capillary: >600 mg/dL (ref 65–99)
Glucose-Capillary: 165 mg/dL — ABNORMAL HIGH (ref 65–99)
Glucose-Capillary: 139 mg/dL — ABNORMAL HIGH (ref 65–99)

## 2015-11-16 LAB — URINE CULTURE: CULTURE: NO GROWTH

## 2015-11-16 LAB — PHOSPHORUS: PHOSPHORUS: 2.1 mg/dL — AB (ref 2.5–4.6)

## 2015-11-16 LAB — PREPARE RBC (CROSSMATCH)

## 2015-11-16 LAB — MAGNESIUM: MAGNESIUM: 1.9 mg/dL (ref 1.7–2.4)

## 2015-11-16 MED ORDER — NOREPINEPHRINE BITARTRATE 1 MG/ML IV SOLN
2.0000 ug/min | INTRAVENOUS | Status: DC
  Administered 2015-11-16: 5 ug/min via INTRAVENOUS
  Filled 2015-11-16 (×2): qty 4

## 2015-11-16 MED ORDER — SODIUM CHLORIDE 0.9 % IV SOLN: Freq: Once | INTRAVENOUS | Status: DC

## 2015-11-16 MED ORDER — DEXTROSE 5 % IV SOLN
0.0000 ug/min | INTRAVENOUS | Status: DC
  Administered 2015-11-16 (×4): 200 ug/min via INTRAVENOUS
  Administered 2015-11-17: 150 ug/min via INTRAVENOUS
  Administered 2015-11-17: 200 ug/min via INTRAVENOUS
  Administered 2015-11-17: 175 ug/min via INTRAVENOUS
  Administered 2015-11-17: 200 ug/min via INTRAVENOUS
  Administered 2015-11-17: 150.133 ug/min via INTRAVENOUS
  Administered 2015-11-18: 200 ug/min via INTRAVENOUS
  Administered 2015-11-18 (×2): 100 ug/min via INTRAVENOUS
  Administered 2015-11-18: 200 ug/min via INTRAVENOUS
  Administered 2015-11-19: 100 ug/min via INTRAVENOUS
  Administered 2015-11-19: 140 ug/min via INTRAVENOUS
  Filled 2015-11-16 (×16): qty 4

## 2015-11-16 NOTE — Procedures (Signed)
Central Venous dialysis Catheter Insertion Procedure Note [name] [id number] Dec 19, 1927  Procedure: Insertion of Central Venous Catheter Indications: Assessment of intravascular volume, Drug and/or fluid administration and Frequent blood sampling  Procedure Details Consent: Risks of procedure as well as the alternatives and risks of each were explained to the (patient/caregiver).  Consent for procedure obtained. Time Out: Verified patient identification, verified procedure, site/side was marked, verified correct patient position, special equipment/implants available, medications/allergies/relevent history reviewed, required imaging and test results available.  Performed  Maximum sterile technique was used including antiseptics, cap, gloves, gown, hand hygiene, mask and sheet. Skin prep: Chlorhexidine; local anesthetic administered A antimicrobial bonded/coated triple lumen catheter was placed in the right femoral vein due to multiple attempts, no other available access and patient being a dialysis patient using the Seldinger technique.  Evaluation Blood flow good; i was not able to pass guide wire via right IJ side so I aborted this effort.  Complications: No apparent complications Patient did tolerate procedure well. Chest X-ray ordered to verify placement.  CXR: normal.  Shelby Mattocks 11/16/2015, 4:27 PM Simonne Martinet ACNP-BC [facility] Pulmonary/Critical Care Pager # [phone] OR # [phone] if no answer

## 2015-11-16 NOTE — Progress Notes (Signed)
PULMONARY / CRITICAL CARE MEDICINE   Name: [name] MRN: [id number] DOB: Jan 03, 1928    ADMISSION DATE:  10/26/2015 CONSULTATION DATE:  11/17/2015  REFERRING MD:  Chiquita Loth  CHIEF COMPLAINT:  Altered mental status  HISTORY OF PRESENT ILLNESS:   [name] is an 82yo F w/ hx ESRD on HD MWF, a fib, HFpEF, HLD, who presented to the ED from her SNF with complaints of altered mental status. She was recently admitted from 7/7-7/10 after a fall and again from 7/11-7/17 for sepsis 2/2 UTI. She had similar symptoms of altered mental status and borderline blood pressures that admission. She is chronically hypotensive, with blood pressures in the 88E systolic. She was started on midodrine 10 BID last admission. She is unable to give any history today - just tells me her name and birthdate but can't say much more. She moans with any movement and cannot localize pain.   SUBJECTIVE:  Remains hypotensive Afebrile Hb noted low this am - repeated  VITAL SIGNS: BP 68/35 mmHg  Pulse 80  Temp(Src) 97.2 F (36.2 C) (Oral)  Resp 10  Ht 5' 5"  (1.651 m)  Wt 148 lb 2.4 oz (67.2 kg)  BMI 24.65 kg/m2  SpO2 100%  HEMODYNAMICS:    VENTILATOR SETTINGS:    INTAKE / OUTPUT: I/O last 3 completed shifts: In: 1857.5 [I.V.:1357.5; IV Piggyback:500] Out: -103   PHYSICAL EXAMINATION:  General Well nourished, well developed, no apparent distress  HEENT No gross abnormalities. Oropharynx clear. Mallampati III. adequate dentition.   Pulmonary Clear to auscultation bilaterally with no wheezes, rales or ronchi on anterior exam. Good effort, symmetrical expansion.   Cardiovascular Irregularly irregular rhythm with occasional runs in the 130s, usually 110s. S1, s2. No m/r/g. Distal pulses palpable.  Abdomen Soft, non-tender, non-distended, positive bowel sounds, no palpable organomegaly or masses. Normoresonant to percussion.  Musculoskeletal Grossly normal. HD access LUE (loop graft)  Lymphatics No  cervical, supraclavicular or axillary adenopathy.   Neurologic Somnolent,Grossly intact. No focal deficits.   Skin/Integuement No rash, no cyanosis, no clubbing. Diffuse anasarca with 2-3+ pitting edema of all extremities. Bruising noted on forearms.     LABS:  BMET  Recent Labs Lab 11/10/15 1030 11/09/2015 1118 11/08/2015 1826 11/16/15 0400  NA 133* 137 138 134*  K 5.7* 4.1 4.4 3.4*  CL 99* 96* 101 98*  CO2 26  --  28 27  BUN 8 23* 22* 13  CREATININE 2.14* 2.80* 3.17* 2.15*  GLUCOSE 95 160* 102* 173*    Electrolytes  Recent Labs Lab 11/09/15 1331 11/10/15 1030 11/04/2015 1826 11/16/15 0400  CALCIUM 7.6* 7.9* 9.1 8.9  MG  --   --   --  1.9  PHOS 1.3*  --   --  2.1*    CBC  Recent Labs Lab 11/09/2015 1826 11/16/15 0400 11/16/15 0638  WBC 12.1* 10.8* 10.6*  HGB 9.5* 6.5* 6.5*  HCT 28.8* 19.7* 19.5*  PLT 93* 71* 75*    Coag's  Recent Labs Lab 11/01/2015 1827  INR 1.44    Sepsis Markers No results for input(s): LATICACIDVEN, PROCALCITON, O2SATVEN in the last 168 hours.  ABG  Recent Labs Lab 11/09/2015 1116  PHART 7.423  PCO2ART 45.2*  PO2ART 179.0*    Liver Enzymes  Recent Labs Lab 11/09/15 1331 10/27/2015 1826 11/16/15 0400  AST  --  69* 56*  ALT  --  59* 36  ALKPHOS  --  177* 111  BILITOT  --  1.0 1.6*  ALBUMIN 2.5* 2.0*  3.7    Cardiac Enzymes No results for input(s): TROPONINI, PROBNP in the last 168 hours.  Glucose  Recent Labs Lab 11/12/2015 2202 10/28/2015 2204 11/25/2015 2223 11/24/2015 2340 11/16/15 0442 11/16/15 0928  GLUCAP 33* 61* >600* 175* 165* 165*    Imaging Ct Head Wo Contrast  11/25/2015  CLINICAL DATA:  Altered mental status. History of hypertension and diabetes. Dialysis patient. EXAM: CT HEAD WITHOUT CONTRAST TECHNIQUE: Contiguous axial images were obtained from the base of the skull through the vertex without intravenous contrast. COMPARISON:  Head CT dated 12/24/2010. FINDINGS: Brain: There is generalized age related  parenchymal atrophy with commensurate dilatation of the ventricles and sulci. Mild chronic small vessel ischemic change noted within the deep periventricular white matter regions. Small old lacunar infarcts within each basal ganglia. There is no mass, hemorrhage, edema or other evidence of acute parenchymal abnormality. No extra-axial hemorrhage. Vascular: No hyperdense vessel or unexpected calcification. There are chronic calcified atherosclerotic changes of the large vessels at the skull base. Skull: Negative for fracture or focal lesion. Sinuses/Orbits: No acute findings. Other: None. IMPRESSION: No acute intracranial abnormality. No intracranial mass, hemorrhage or edema. Atrophy and chronic ischemic changes as detailed above. Electronically Signed   By: Franki Cabot M.D.   On: 11/23/2015 21:59   US Abdomen Complete  11/16/2015  CLINICAL DATA:  Transaminitis EXAM: ABDOMEN ULTRASOUND COMPLETE COMPARISON:  Abdominal CT 04/03/2011 FINDINGS: Gallbladder: Reportedly surgically absent Common bile duct: Diameter: 2 mm Liver: No focal lesion identified. Within normal limits in parenchymal echogenicity. Antegrade flow in the imaged portal venous system. IVC: Distended on all images. Pancreas: Obscured. Spleen: Limited visualization.  No enlargement or focal finding. Right Kidney: Length: 8 cm.  Echogenic.  No hydronephrosis. Left Kidney: Not visualized, likely due to atrophy and echogenicity based on 2012 CT. Abdominal aorta: Atheromatous changes. Only the midportion is seen, non aneurysmal. Other findings: None. IMPRESSION: 1. No specific explanation for transaminitis. The IVC is distended, consider passive congestion. 2. Limited study with nonvisualization of the pancreas and left kidney. Electronically Signed   By: Monte Fantasia M.D.   On: 11/16/2015 05:22   Dg Chest Port 1 View  11/16/2015  CLINICAL DATA:  Altered mental status. EXAM: PORTABLE CHEST 1 VIEW COMPARISON:  11/01/2015. FINDINGS: Patient is  rotated. Heart is enlarged. Suspect collapse/ consolidation in the right middle lobe and left lower lobe. Layering pleural effusions are seen bilaterally. Mild interstitial prominence. Vascular stent is seen in the left axillary region. IMPRESSION: 1. Moderate layering bilateral pleural effusions. Difficult to exclude edema and therefore congestive heart failure. 2. Collapse/consolidation in the right middle and left lower lobes. Electronically Signed   By: Lorin Picket M.D.   On: 11/16/2015 07:37   Dg Chest Portable 1 View  10/26/2015  CLINICAL DATA:  Dialysis.  Altered mental status. EXAM: PORTABLE CHEST 1 VIEW COMPARISON:  11/05/2015. FINDINGS: Cardiomegaly with bilateral pulmonary infiltrates consistent with pulmonary edema. Bilateral pleural effusions are noted. Similar findings noted on prior exam. No pneumothorax. Left axillary stent noted. The stent appears angulated. No acute bony abnormality. IMPRESSION: Congestive heart failure with bilateral pulmonary edema and bilateral pleural effusions. Similar findings noted on prior study of 11/05/2015. Electronically Signed   By: Marcello Moores  Register   On: 11/11/2015 11:36     STUDIES:  CT head 7/21 neg TTE  7/12 from last admission with preserved EF.  7/21 RUQ ultrasound neg  CULTURES: Blood cx pending 7/21 >> Urine cx pending 7/21 >>  ANTIBIOTICS: Ceftaz 7/21 >>  Vancomycin 7/21 >>  SIGNIFICANT EVENTS:   LINES/TUBES: PIV  DISCUSSION: Ms. Heyne is an 82YO AAF with complicated past medical history who presents with altered mental status and anasarca, hypotension concerning for sepsis of unclear source. She does have new elevations of AST, ALT and AlkP, but not bilirubin. Her urine is unimpressive and she just completed treatment for UTI a few days ago. Her CXR is consistent with pulmonary edema, but infection cannot be ruled out. She went for HD and became more hypotensive with MAPs in the 50s.    ASSESSMENT /  PLAN:  PULMONARY A: Hypoxemia 2/2 pulmonary edema +/- infection P:   Continue supplemental O2 as needed for sats > 92% Continue empiric antibiotics Continued efforts at volume removal   CARDIOVASCULAR A:  ? Septic shock Hypotension - chronic Paroxysmal A fib P:  Levophed gtt Continue home midodrine Rate control goal HR <120   RENAL A:   ESRD on iHD MWF Hypokalemia P:    dialysis on hold for now Poor candidate for CRRT >> given overall failure to thrive  GASTROINTESTINAL A:   Elevated transaminases, alk phos -RUQ ultrasound neg Hx GERD P:   Continue empiric abx Repeat LFTs   HEMATOLOGIC A:   Coagulopathy P:  Trend INR SCDs for dvt ppx for now  INFECTIOUS A:   Concern for sepsis - source unclear P:   Continue empiric antibiotics  Follow cultures  ENDOCRINE A:   Hx DMII P:   Accuchecks / SSI  NEUROLOGIC A:   Acute Encephalopathy - likely toxic metabolic, possibly related to underlying infection P:   RASS goal:0 High risk for delirium    FAMILY  - Updates: No family available - will need discussion about goals of care here & ongoing HD  - Inter-disciplinary family meet or Palliative Care meeting due by:  day 7  Cc time x  35 minutes.   Kara Mead MD. Shade Flood. [place] Pulmonary & Critical care Pager [phone] If no response call [phone]    11/16/2015, 10:41 AM

## 2015-11-16 NOTE — Progress Notes (Signed)
eLink Physician-Brief Progress Note Patient Name: SHIVA EKLOF DOB: 01/18/1928 MRN: [id number]   Date of Service  11/16/2015  HPI/Events of Note  Hgb drop from 9.5 to 6.5  eICU Interventions  Plan: Transfuse 1 unit pRBC Post-transfusion CBC     Intervention Category Intermediate Interventions: Other:  DETERDING,ELIZABETH 11/16/2015, 4:20 AM

## 2015-11-16 NOTE — Progress Notes (Signed)
Patient ID: [name], female   DOB: June 03, 1927, 82 y.o.   MRN: [id number]  [facility] Progress Note   Assessment/ Plan:   1. Altered mental status: Suspected metabolic encephalopathy probably associated with infection/sepsis and has been consequently started on broad-spectrum antibiotic treatment with Fortaz and vancomycin. Also noted to have had hypoglycemia overnight that may have contributed to her current mental status change. 2. ESRD: With significant evidence of volume excess, unfortunately hypotension limiting to ultrafiltration on conventional hemodialysis. The big question is whether restoration of euvolemic state with CRRT will make any clear difference with her prognosis and clinical outcome at this time. Labs from this morning did not show any critical electrolyte abnormalities. Her daughter is identified as her healthcare power of attorney and I will attempt to reach out to her today to establish some goals of care. On Neo-Synephrine-remains hypotensive and unlikely to tolerate conventional hemodialysis. 3. Anemia: Low hemoglobin noted, in part might be dilutional from volume excess but also from her ESRD. Getting PRBCs transfusion at this time. 4. CKD-MBD: Mental status impairing optimal oral intake-not on binders, holding VDRA 5. Nutrition: Currently essentially nothing by mouth  Following her recent fall and admission for altered mental status/sepsis/urinary tract infection-it appears that she has continued to fail to thrive leading up to her most recent hospitalization. Unfortunately, with her advanced age and limited reserve, discussions need to be held with her healthcare power of attorney with regards to aggressiveness of care that includes CRRT which may be of greater risk than benefit at this point.  Subjective:   Unable to tolerate ultrafiltration with conventional hemodialysis yesterday-treatment ended after about 2 hours with net fluid_-100 mL after  requirement of albumin boluses. Currently getting PRBCs. Transferred to ICU and started on Neo-Synephrine.    Objective:   BP 68/35 mmHg  Pulse 80  Temp(Src) 97.4 F (36.3 C) (Oral)  Resp 10  Ht 5\' 5"  (1.651 m)  Wt 67.2 kg (148 lb 2.4 oz)  BMI 24.65 kg/m2  SpO2 100%  Physical Exam: [id number], appears to be comfortable resting in bed-opens eyes with much cajoling CVS: Pulse regular, S1 and S2 with ejection systolic murmur Resp: Coarse breath sounds-poor inspiratory effort bilaterally Abd: Soft, obese, nontender Ext: 2+ lower extremity edema, 2+ upper extremity edema  Labs: BMET  Recent Labs Lab 11/09/15 1331 11/10/15 1030 Dec 06, 2015 1118 2015-12-06 1826 11/16/15 0400  NA 132* 133* 137 138 134*  K 2.7* 5.7* 4.1 4.4 3.4*  CL 97* 99* 96* 101 98*  CO2 27 26  --  28 27  GLUCOSE 101* 95 160* 102* 173*  BUN 5* 8 23* 22* 13  CREATININE 1.69* 2.14* 2.80* 3.17* 2.15*  CALCIUM 7.6* 7.9*  --  9.1 8.9  PHOS 1.3*  --   --   --  2.1*   CBC  Recent Labs Lab 11/10/15 1030 12/06/2015 1118 06-Dec-2015 1826 11/16/15 0400 11/16/15 0638  WBC 7.7  --  12.1* 10.8* 10.6*  NEUTROABS  --   --  10.0*  --   --   HGB 9.2* 11.6* 9.5* 6.5* 6.5*  HCT 27.5* 34.0* 28.8* 19.7* 19.5*  MCV 85.9  --  89.4 90.4 91.5  PLT 74*  --  93* 71* 75*   Medications:    . sodium chloride   Intravenous Once  . [START ON 11/18/2015] cefTAZidime (FORTAZ)  IV  2 g Intravenous Q M,W,F-HD  . heparin  5,000 Units Subcutaneous Q8H  . sodium chloride flush  3 mL Intravenous  Q12H  . [START ON 11/18/2015] vancomycin  750 mg Intravenous Q M,W,F-HD   Zetta Bills, MD 11/16/2015, 7:36 AM

## 2015-11-16 NOTE — Progress Notes (Signed)
Nursing Note: 35 Spoke with Daughter on the phone who is identified as healthcare power of attorney.  Updated her on her mother's condition and advised of need for blood transfusion.  Daughter gave verbal consent for blood transfusion and was witnessed by 2nd nurse Remonia Richter, RN.  Placed order for STAT PIV access as patient currently has limited access.  At this time vasopressors are infusing and the blood is being prepared.

## 2015-11-16 NOTE — Progress Notes (Signed)
Nursing Note: Patient transferred via HD RN and transporter tech following head CT.  Patient connected to ICU monitoring systems and baseline VS obtained.  Patient found to be hypoglycemic at 57. D50 given and E-link notified.  Reassessment of CBG in 15 minutes revealed right left hand 53 and right hand 41.  D50 given and E-link notified.  E-Link RN advised to check CBG via earlobe stick.  Advised that under current [facility] and FDA the current meters being used at this facility are not approved for earlobe glucose assessment.  E-Link physician accessed room via camera and advised that she would be writing an order for earlobe glucose assessment.  Advised unable to do so against policy.  MD noted that she was putting the order in.  Contact the Point of Care on Call to verify what had already been known and printed in currently Point of Care Policy that earlobe glucose assessment was not approved at this facility.  CCM in house rounds was intubating another patient in the unit and I advised of current situation.  She advised to treat patient based upon current level of conscious and CBG reading together until a definitive lab result could be obtained.  IV team placed right chest peripheral IV and blood drawn to sample.  Initial reading >High - noted IV antibiotics with dextrose infusing.  CCM at bedside with RT to place arterial line.  Glucose sampled from arterial line - 175.  No treatment at this time.

## 2015-11-16 NOTE — Progress Notes (Signed)
Spoke to [employer]. Should Mrs Babayev suffer respiratory or cardiac arrest she would be DNR. She does want aggressive care up to that point which included central access, vasoactive gtts and HD IF nephrology deems appropriate.   Simonne Martinet ACNP-BC [facility] Pulmonary/Critical Care Pager # [phone] OR # [phone] if no answer

## 2015-11-17 LAB — GLUCOSE, CAPILLARY
GLUCOSE-CAPILLARY: 99 mg/dL (ref 65–99)
GLUCOSE-CAPILLARY: 116 mg/dL — AB (ref 65–99)
Glucose-Capillary: 102 mg/dL — ABNORMAL HIGH (ref 65–99)
Glucose-Capillary: 89 mg/dL (ref 65–99)
Glucose-Capillary: 98 mg/dL (ref 65–99)
Glucose-Capillary: 98 mg/dL (ref 65–99)

## 2015-11-17 LAB — TYPE AND SCREEN
ABO/RH(D): B NEG
ANTIBODY SCREEN: NEGATIVE
UNIT DIVISION: 0

## 2015-11-17 LAB — CBC
HCT: 22.9 % — ABNORMAL LOW (ref 36.0–46.0)
Hemoglobin: 7.8 g/dL — ABNORMAL LOW (ref 12.0–15.0)
MCH: 30.6 pg (ref 26.0–34.0)
MCHC: 34.1 g/dL (ref 30.0–36.0)
MCV: 89.8 fL (ref 78.0–100.0)
PLATELETS: 64 10*3/uL — AB (ref 150–400)
RBC: 2.55 MIL/uL — ABNORMAL LOW (ref 3.87–5.11)
RDW: 21 % — AB (ref 11.5–15.5)
WBC: 13.7 10*3/uL — ABNORMAL HIGH (ref 4.0–10.5)

## 2015-11-17 LAB — BASIC METABOLIC PANEL
Anion gap: 8 (ref 5–15)
BUN: 16 mg/dL (ref 6–20)
CALCIUM: 9 mg/dL (ref 8.9–10.3)
CHLORIDE: 95 mmol/L — AB (ref 101–111)
CO2: 26 mmol/L (ref 22–32)
CREATININE: 2.7 mg/dL — AB (ref 0.44–1.00)
GFR calc Af Amer: 17 mL/min — ABNORMAL LOW (ref >60)
GFR calc non Af Amer: 15 mL/min — ABNORMAL LOW (ref >60)
Glucose, Bld: 96 mg/dL (ref 65–99)
Potassium: 3.8 mmol/L (ref 3.5–5.1)
SODIUM: 129 mmol/L — AB (ref 135–145)

## 2015-11-17 MED ORDER — HYDROCORTISONE NA SUCCINATE PF 100 MG IJ SOLR
50.0000 mg | Freq: Four times a day (QID) | INTRAMUSCULAR | Status: DC
  Administered 2015-11-17 – 2015-11-19 (×8): 50 mg via INTRAVENOUS
  Filled 2015-11-17 (×6): qty 1
  Filled 2015-11-17: qty 2
  Filled 2015-11-17 (×2): qty 1
  Filled 2015-11-17: qty 2

## 2015-11-17 MED ORDER — MIDODRINE HCL 5 MG PO TABS
10.0000 mg | ORAL_TABLET | Freq: Three times a day (TID) | ORAL | Status: DC
  Administered 2015-11-17 – 2015-11-18 (×4): 10 mg via ORAL
  Filled 2015-11-17 (×8): qty 2

## 2015-11-17 MED ORDER — METOPROLOL TARTRATE 5 MG/5ML IV SOLN
2.5000 mg | INTRAVENOUS | Status: DC | PRN
  Administered 2015-11-17 – 2015-11-18 (×3): 2.5 mg via INTRAVENOUS
  Filled 2015-11-17 (×3): qty 5

## 2015-11-17 MED ORDER — VANCOMYCIN HCL IN DEXTROSE 750-5 MG/150ML-% IV SOLN
750.0000 mg | INTRAVENOUS | Status: DC
  Filled 2015-11-17: qty 150

## 2015-11-17 MED ORDER — WHITE PETROLATUM GEL
Status: AC
  Administered 2015-11-17: 16:00:00
  Filled 2015-11-17: qty 1

## 2015-11-17 NOTE — Progress Notes (Addendum)
Patient ID: [name], female   DOB: 01/21/1928, 82 y.o.   MRN: [id number]  [name] Progress Note   Assessment/ Plan:   1. Altered mental status: Suspected metabolic encephalopathy associated with infection/sepsis and on broad-spectrum antibiotic treatment with Fortaz and vancomycin-blood cultures and urine cultures negative to date. Also with transient hypoglycemia that was treated. Granddaughter concerned about excessive sedation in response to narcotic analgesic-tramadol that she may have received at the nursing home. Mentation somewhat improved this morning.  2. ESRD: Remains hypotensive and on Neo-Synephrine for blood pressure support. Based on her labs, no acute dialysis needs noted at this time. She is hypervolemic with anasarca/weeping of serous fluid from skin however, I do not recommend CRRT as this will likely exacerbate her hypotension/pressor needs. Will attempt dialysis again tomorrow with limited ultrafiltration---if intolerant, will need discussions with the family about continued aggressive level of care versus transition to comfort care. 3. Anemia: Hemoglobin somewhat better after packed red cell transfusion yesterday. No overt loss noted 4. CKD-MBD: Mental status impairing optimal oral intake-not on binders, holding VDRA 5. Nutrition: Currently essentially nothing by mouth 6. Hypotension: Suspected sepsis but also with chronic hypotension from dialysis associated autonomic neuropathy-on Neo-Synephrine, transition to oral midodrine after mental status allows safe swallowing.   Discussed with her daughter [name]) and granddaughter [name]) yesterday to update them on [name]'s status and appreciate them making the decision to switch her to DO NOT RESUSCITATE status given the limited physical reserve she has.  Subjective:   She complains of pain on the sides of her chest this morning.    Objective:   BP (!) 68/35   Pulse (!) 106   Temp 98.4 F  (36.9 C) (Oral)   Resp 16   Ht 5\' 5"  (1.651 m)   Wt 72.2 kg (159 lb 2.8 oz)   SpO2 100%   BMI 26.49 kg/m   Physical Exam: EYC:XKGYJEHUD, Awake, slow to respond to questions CVS: Pulse regular, S1 and S2 with ejection systolic murmur Resp: Coarse breath sounds-poor inspiratory effort bilaterally Abd: Soft, obese, nontender Ext: 2+ lower extremity edema, 2+ upper extremity edema  Labs: BMET  Recent Labs Lab 11/10/15 1030 2015/12/14 1118 14-Dec-2015 1826 11/16/15 0400 11/16/15 2125 11/17/15 0345  NA 133* 137 138 134* 128* 129*  K 5.7* 4.1 4.4 3.4* 3.7 3.8  CL 99* 96* 101 98* 94* 95*  CO2 26  --  28 27 24 26   GLUCOSE 95 160* 102* 173* 94 96  BUN 8 23* 22* 13 15 16   CREATININE 2.14* 2.80* 3.17* 2.15* 2.51* 2.70*  CALCIUM 7.9*  --  9.1 8.9 8.8* 9.0  PHOS  --   --   --  2.1*  --   --    CBC  Recent Labs Lab 14-Dec-2015 1826 11/16/15 0400 11/16/15 0638 11/16/15 2115 11/17/15 0345  WBC 12.1* 10.8* 10.6* 12.1* 13.7*  NEUTROABS 10.0*  --   --   --   --   HGB 9.5* 6.5* 6.5* 7.7* 7.8*  HCT 28.8* 19.7* 19.5* 22.5* 22.9*  MCV 89.4 90.4 91.5 90.0 89.8  PLT 93* 71* 75* 72* 64*   Medications:    . sodium chloride   Intravenous Once  . [START ON 11/18/2015] cefTAZidime (FORTAZ)  IV  2 g Intravenous Q M,W,F-HD  . heparin  5,000 Units Subcutaneous Q8H  . sodium chloride flush  3 mL Intravenous Q12H  . [START ON 11/18/2015] vancomycin  750 mg Intravenous Q M,W,F-HD   Zetta Bills,  MD 11/17/2015, 7:41 AM

## 2015-11-17 NOTE — Progress Notes (Signed)
Utilization review completed.  

## 2015-11-17 NOTE — Progress Notes (Signed)
PULMONARY / CRITICAL CARE MEDICINE   Name: [name] MRN: [id number] DOB: 1927-05-01    ADMISSION DATE:  11/24/2015 CONSULTATION DATE:  11/11/2015  REFERRING MD:  Chiquita Loth  CHIEF COMPLAINT:  Altered mental status  HISTORY OF PRESENT ILLNESS:   [name] is an 82yo F w/ hx ESRD on HD MWF, a fib, HFpEF, HLD, who presented to the ED from her SNF with complaints of altered mental status. She was recently admitted from 7/7-7/10 after a fall and again from 7/11-7/17 for sepsis 2/2 UTI. She had similar symptoms of altered mental status and borderline blood pressures that admission. She is chronically hypotensive, with blood pressures in the 46F systolic. She was started on midodrine 10 BID last admission. She is unable to give any history today - just tells me her name and birthdate but can't say much more. She moans with any movement and cannot localize pain.    SUBJECTIVE:  A little more awake  VITAL SIGNS: BP (!) 68/35   Pulse (!) 106   Temp 98.2 F (36.8 C) (Oral)   Resp 16   Ht 5' 5"  (1.651 m)   Wt 159 lb 2.8 oz (72.2 kg)   SpO2 100%   BMI 26.49 kg/m   HEMODYNAMICS:    VENTILATOR SETTINGS:    INTAKE / OUTPUT: I/O last 3 completed shifts: In: 5503.8 [I.V.:4643.8; Blood:360; IV Piggyback:500] Out: -103   PHYSICAL EXAMINATION:  General Chronically ill appearing AAF, extremities are weeping, she remains faily lethargic but is in no apparent distress  HEENT No gross abnormalities. Oropharynx clear. Mallampati III. adequate dentition. Right IJ site dressing intact  Pulmonary Clear to auscultation bilaterally with no wheezes, rales or ronchi on anterior exam. Good effort, symmetrical expansion. C/o chest discomfort w/ deep breath  Cardiovascular Irregularly irregular rhythm with occasional runs in the 130s, usually 110s. S1, s2. No m/r/g. Distal pulses palpable.  Abdomen Soft, non-tender, non-distended, positive bowel sounds, no palpable organomegaly or masses.  Normoresonant to percussion.  Musculoskeletal Grossly normal. HD access LUE (loop graft)  Lymphatics No cervical, supraclavicular or axillary adenopathy.   Neurologic Grossly intact. No focal deficits.   Skin/Integuement No rash, no cyanosis, no clubbing. Diffuse anasarca with 2-3+ pitting edema of all extremities. Bruising noted on forearms. Weeping extremities     LABS:  BMET  Recent Labs Lab 11/16/15 0400 11/16/15 2125 11/17/15 0345  NA 134* 128* 129*  K 3.4* 3.7 3.8  CL 98* 94* 95*  CO2 27 24 26   BUN 13 15 16   CREATININE 2.15* 2.51* 2.70*  GLUCOSE 173* 94 96    Electrolytes  Recent Labs Lab 11/16/15 0400 11/16/15 2125 11/17/15 0345  CALCIUM 8.9 8.8* 9.0  MG 1.9  --   --   PHOS 2.1*  --   --     CBC  Recent Labs Lab 11/16/15 0638 11/16/15 2115 11/17/15 0345  WBC 10.6* 12.1* 13.7*  HGB 6.5* 7.7* 7.8*  HCT 19.5* 22.5* 22.9*  PLT 75* 72* 64*    Coag's  Recent Labs Lab 11/17/2015 1827  INR 1.44    Sepsis Markers No results for input(s): LATICACIDVEN, PROCALCITON, O2SATVEN in the last 168 hours.  ABG  Recent Labs Lab 11/01/2015 1116  PHART 7.423  PCO2ART 45.2*  PO2ART 179.0*    Liver Enzymes  Recent Labs Lab 11/18/2015 1826 11/16/15 0400  AST 69* 56*  ALT 59* 36  ALKPHOS 177* 111  BILITOT 1.0 1.6*  ALBUMIN 2.0* 3.7    Cardiac Enzymes No  results for input(s): TROPONINI, PROBNP in the last 168 hours.  Glucose  Recent Labs Lab 11/16/15 0928 11/16/15 1541 11/16/15 2007 11/17/15 0004 11/17/15 0357 11/17/15 0801  GLUCAP 165* 139* 99 98 102* 99    Imaging Dg Chest Port 1 View  Result Date: 11/16/2015 CLINICAL DATA:  Unsuccessful central line placement. Rule out pneumothorax or other complication. History of hypertension, CHF, atrial fibrillation. EXAM: PORTABLE CHEST 1 VIEW COMPARISON:  Chest x-ray from earlier same day and 11/21/2015. FINDINGS: Cardiomegaly appears stable. Overall cardiomediastinal silhouette appears stable in  size and configuration given the oblique semi-erect positioning. Interstitial prominence appears stable, likely mild edema. Suspect small layering pleural effusions bilaterally and bibasilar atelectasis. No pneumothorax.  Left axillary stent appear stable in position. IMPRESSION: 1. Stable chest x-ray compared to recent exams. Stable cardiomegaly. Interstitial prominence appears stable, likely interstitial edema suggesting CHF/volume overload. Suspect small stable pleural effusions and bibasilar atelectasis. 2. No pneumothorax. Electronically Signed   By: Franki Cabot M.D.   On: 11/16/2015 13:42     STUDIES:  CT head pending.  CXR as above TTE from last admission with preserved EF.   CULTURES: Blood cx pending 7/21 >> Urine cx pending 7/21 >>  ANTIBIOTICS: Ceftaz 7/21 >> Vancomycin 7/21 >>7/23  SIGNIFICANT EVENTS: 7/22 fem line placed. Right IJ unable to place d/t could not adv guidewire. Did make limitations to care   LINES/TUBES: PIV  DISCUSSION: Ms. Wulf is an 2LK AAF with complicated past medical history who presents with altered mental status and anasarca. The admitting team is concerned for sepsis of unclear source. She does have new elevations of AST, ALT and AlkP, but not bilirubin but suspect that this is d/t to shock liver. I am less impressed w/ sepsis and suspect that this is much more likely failure to thrive, severe protein malnutrition and inability to tolerate HD any further. Renal is reluctant to offer CRRT and I agree as not sure this changes the end-point here if she still can't transition to traditional HD. Have presented to the daughter that should she no longer tolerate HD there may be nothing further that we can add.  ASSESSMENT / PLAN:  PULMONARY A: Hypoxemia 2/2 pulmonary edema +/- infection P:   Continue supplemental O2 as needed for sats > 92% Continue empiric antibiotics Continued efforts at volume removal   CARDIOVASCULAR A:  Hypotension -  chronic Paroxysmal A fib P:  A-line for more accurate BP monitoring Resume home midodrine Rate control goal HR <120 Will hold off on pressor while MAP > 50 unless evidence of hypoperfusion  RENAL A:   ESRD on iHD MWF P:   Continue dialysis as scheduled Renal thinks no utility in CRRT   GASTROINTESTINAL A:   Severe protein calorie malnutrition  Elevated transaminases, alk phos-->improved. Suspect that this was shock liver  US negative.  Hx GERD P:  Continue empiric abx Repeat LFTs PRN  HEMATOLOGIC A:   Coagulopathy P:  Trend INR SCDs for dvt ppx for now  INFECTIOUS A:   Concern for sepsis - source unclear-->no + culture to date.  P:   Continue empiric antibiotics pending further workup but dc vanc Follow cultures  ENDOCRINE A:   Hx DMII P:   Accuchecks / SSI  NEUROLOGIC A:   Encephalopathy - likely toxic metabolic, seems less likely sepsis at this point  P:   RASS goal:0 Hold all sedating meds   FAMILY  - Updates: updated her daughter Silva Bandy at bedside (POA)  - Inter-disciplinary  family meet or Palliative Care meeting due by:  day [place] ACNP-BC [place] Pager # [phone] OR # [phone] if no answer    11/17/2015, 10:40 AM

## 2015-11-17 NOTE — Progress Notes (Signed)
Called Elink regarding hypotensive event 69/40 (51). Patient was maxed out on neo and HR was in the 140s. Was told to continue to monitor and watch closely. Melina Schools, [place] 11/17/2015 10:15 PM

## 2015-11-17 NOTE — Progress Notes (Signed)
eLink Physician-Brief Progress Note Patient Name: [name] DOB: 19-Mar-1928 MRN: [id number]   Date of Service  11/17/2015  HPI/Events of Note  Rapid afib  eICU Interventions  Lopressor 2.5 mg bolus prn      Intervention Category Major Interventions: Arrhythmia - evaluation and management  Sandrea Hughs 11/17/2015, 7:42 PM

## 2015-11-18 DIAGNOSIS — E8770 Fluid overload, unspecified: Secondary | ICD-10-CM

## 2015-11-18 DIAGNOSIS — R400 Somnolence: Secondary | ICD-10-CM

## 2015-11-18 DIAGNOSIS — I959 Hypotension, unspecified: Secondary | ICD-10-CM

## 2015-11-18 DIAGNOSIS — R5383 Other fatigue: Secondary | ICD-10-CM

## 2015-11-18 LAB — BASIC METABOLIC PANEL
ANION GAP: 9 (ref 5–15)
BUN: 22 mg/dL — ABNORMAL HIGH (ref 6–20)
CHLORIDE: 92 mmol/L — AB (ref 101–111)
CO2: 22 mmol/L (ref 22–32)
Calcium: 8.6 mg/dL — ABNORMAL LOW (ref 8.9–10.3)
Creatinine, Ser: 3.16 mg/dL — ABNORMAL HIGH (ref 0.44–1.00)
GFR, EST AFRICAN AMERICAN: 14 mL/min — AB (ref >60)
GFR, EST NON AFRICAN AMERICAN: 12 mL/min — AB (ref >60)
Glucose, Bld: 116 mg/dL — ABNORMAL HIGH (ref 65–99)
POTASSIUM: 4.1 mmol/L (ref 3.5–5.1)
SODIUM: 123 mmol/L — AB (ref 135–145)

## 2015-11-18 LAB — CBC
HCT: 22.9 % — ABNORMAL LOW (ref 36.0–46.0)
HEMOGLOBIN: 7.8 g/dL — AB (ref 12.0–15.0)
MCH: 30.7 pg (ref 26.0–34.0)
MCHC: 34.1 g/dL (ref 30.0–36.0)
MCV: 90.2 fL (ref 78.0–100.0)
PLATELETS: 78 10*3/uL — AB (ref 150–400)
RBC: 2.54 MIL/uL — AB (ref 3.87–5.11)
RDW: 22.4 % — ABNORMAL HIGH (ref 11.5–15.5)
WBC: 13.4 10*3/uL — AB (ref 4.0–10.5)

## 2015-11-18 LAB — PROCALCITONIN: PROCALCITONIN: 42.26 ng/mL

## 2015-11-18 LAB — GLUCOSE, CAPILLARY
GLUCOSE-CAPILLARY: 111 mg/dL — AB (ref 65–99)
GLUCOSE-CAPILLARY: 123 mg/dL — AB (ref 65–99)
GLUCOSE-CAPILLARY: 165 mg/dL — AB (ref 65–99)
GLUCOSE-CAPILLARY: 81 mg/dL (ref 65–99)
Glucose-Capillary: 41 mg/dL — CL (ref 65–99)
Glucose-Capillary: 127 mg/dL — ABNORMAL HIGH (ref 65–99)

## 2015-11-18 MED ORDER — NOREPINEPHRINE BITARTRATE 1 MG/ML IV SOLN
0.0000 ug/min | INTRAVENOUS | Status: DC
  Administered 2015-11-18: 10 ug/min via INTRAVENOUS
  Filled 2015-11-18 (×2): qty 4

## 2015-11-18 MED ORDER — HEPARIN SODIUM (PORCINE) 1000 UNIT/ML DIALYSIS
2000.0000 [IU] | INTRAMUSCULAR | Status: DC | PRN
Start: 2015-11-18 — End: 2015-11-19

## 2015-11-18 MED ORDER — PENTAFLUOROPROP-TETRAFLUOROETH EX AERO
INHALATION_SPRAY | CUTANEOUS | Status: AC
Start: 2015-11-18 — End: 2015-11-19
  Filled 2015-11-18: qty 103.5

## 2015-11-18 MED ORDER — MORPHINE SULFATE (PF) 2 MG/ML IV SOLN
2.0000 mg | INTRAVENOUS | Status: DC | PRN
  Administered 2015-11-18: 2 mg via INTRAVENOUS
  Filled 2015-11-18: qty 1

## 2015-11-18 NOTE — Progress Notes (Signed)
  [facility] Progress Note   Subjective: heart rate 140's, BP 110 on pressors (neo switched to levophed)  Vitals:   11/18/15 0800 11/18/15 0809 11/18/15 0900 11/18/15 1000  BP:      Pulse: (!) 142  (!) 156 (!) 155  Resp: 13  20 (!) 22  Temp:  97.3 F (36.3 C)    TempSrc:  Oral    SpO2: 100%  100% 100%  Weight:      Height:        Inpatient medications: . sodium chloride   Intravenous Once  . cefTAZidime (FORTAZ)  IV  2 g Intravenous Q M,W,F-HD  . heparin  5,000 Units Subcutaneous Q8H  . hydrocortisone sod succinate (SOLU-CORTEF) inj  50 mg Intravenous Q6H  . midodrine  10 mg Oral TID WC  . sodium chloride flush  3 mL Intravenous Q12H  . vancomycin  750 mg Intravenous Q M,W,F-HD   . norepinephrine (LEVOPHED) Adult infusion 10 mcg/min (11/18/15 0600)  . phenylephrine (NEO-SYNEPHRINE) Adult infusion Stopped (11/18/15 0253)   sodium chloride, sodium chloride, sodium chloride, albumin human, alteplase, heparin, lidocaine (PF), lidocaine-prilocaine, metoprolol, pentafluoroprop-tetrafluoroeth  Exam: Gen lethargic, moaning, not responding verbally CVS: Pulse regular, S1 and S2 with ejection systolic murmur Resp: Coarse breath sounds-poor inspiratory effort bilaterally Abd: Soft, obese, nontender Ext: diffuse 2-3+ LE and UE edema Neuro: confused, severe debility  Dialysis: [facility] MWF  3.5h  F160  64kg   3K/2.25 bath  P5  L thigh AVG  Hep 1000 Last Mircera 6.28 100ug      Assessment: 1  AMS prob TME, on emp IV abx, cx's neg 2  ESRD 3  Anemia 4  Hypotension, chronic - on midodrine 5  DNR 6  Debility, chronic, severe   Plan - not a candidate for CRRT. Will attempt HD today, not sure she will tolerate   Vinson Moselle MD [facility] pager [phone]    cell [phone] 11/18/2015, 10:03 AM    Recent Labs Lab 11/16/15 0400 11/16/15 2125 11/17/15 0345 11/18/15 0404  NA 134* 128* 129* 123*  K 3.4* 3.7 3.8 4.1  CL 98* 94* 95* 92*  CO2 27 24  26 22   GLUCOSE 173* 94 96 116*  BUN 13 15 16  22*  CREATININE 2.15* 2.51* 2.70* 3.16*  CALCIUM 8.9 8.8* 9.0 8.6*  PHOS 2.1*  --   --   --     Recent Labs Lab 12-01-2015 1826 11/16/15 0400  AST 69* 56*  ALT 59* 36  ALKPHOS 177* 111  BILITOT 1.0 1.6*  PROT 4.4* 5.2*  ALBUMIN 2.0* 3.7    Recent Labs Lab December 01, 2015 1826  11/16/15 2115 11/17/15 0345 11/18/15 0404  WBC 12.1*  < > 12.1* 13.7* 13.4*  NEUTROABS 10.0*  --   --   --   --   HGB 9.5*  < > 7.7* 7.8* 7.8*  HCT 28.8*  < > 22.5* 22.9* 22.9*  MCV 89.4  < > 90.0 89.8 90.2  PLT 93*  < > 72* 64* 78*  < > = values in this interval not displayed. Iron/TIBC/Ferritin/ %Sat    Component Value Date/Time   IRON 49 12/28/2008 0525   TIBC 172 (L) 12/28/2008 0525   FERRITIN 201 12/27/2008 0549   IRONPCTSAT 28 12/28/2008 0525

## 2015-11-18 NOTE — Progress Notes (Signed)
PULMONARY / CRITICAL CARE MEDICINE   Name: [name] MRN: [id number] DOB: [birth year]    ADMISSION DATE:  10/29/2015 CONSULTATION DATE:  11/24/2015  REFERRING MD:  Chiquita Loth  CHIEF COMPLAINT:  Altered mental status  HISTORY OF PRESENT ILLNESS:   [name] is an 82yo F w/ hx ESRD on HD MWF, a fib, HFpEF, HLD, who presented to the ED from her SNF with complaints of altered mental status. She was recently admitted from 7/7-7/10 after a fall and again from 7/11-7/17 for sepsis 2/2 UTI. She had similar symptoms of altered mental status and borderline blood pressures that admission. She is chronically hypotensive, with blood pressures in the 06Y systolic. She was started on midodrine 10 BID last admission. She is unable to give any history today - just tells me her name and birthdate but can't say much more. She moans with any movement and cannot localize pain.   SUBJECTIVE:  Afib with RVR overnight.  Remains on pressors.  Changed from neo to levo overnight but worsening RVR.  Plans for HD today but not sure if she will tolerate.  Not candidate for [facility].   VITAL SIGNS: BP (!) 68/35   Pulse (!) 155   Temp 97.3 F (36.3 C) (Oral)   Resp (!) 22   Ht 5' 5" (1.651 m)   Wt 72.5 kg (159 lb 13.3 oz)   SpO2 100%   BMI 26.60 kg/m    INTAKE / OUTPUT: I/O last 3 completed shifts: In: 2842.9 [P.O.:240; I.V.:2602.9] Out: -   PHYSICAL EXAMINATION:  General Elderly, Chronically ill appearing AAF, extremities are weeping, she remains fairly lethargic but is in no apparent distress  HEENT No gross abnormalities. Oropharynx clear. Right IJ site dressing intact  Pulmonary resps even non labored on [place], Clear to auscultation bilaterally with no wheezes, rales or ronchi on anterior exam. Diminished bases.   Cardiovascular Irregularly irregular rhythm with occasional runs in the 130s, usually 110s. S1, s2. No m/r/g. Distal pulses palpable.  Abdomen Soft, non-tender, non-distended, positive bowel  sounds, no palpable organomegaly or masses. Normoresonant to percussion.  Musculoskeletal Grossly normal. HD access LUE (loop graft)  Lymphatics No cervical, supraclavicular or axillary adenopathy.   Neurologic Grossly intact. No focal deficits.   Skin/Integuement No rash, no cyanosis, no clubbing. Diffuse anasarca with 2-3+ pitting edema of all extremities. Bruising noted on forearms. Weeping extremities     LABS:  BMET  Recent Labs Lab 11/16/15 2125 11/17/15 0345 11/18/15 0404  NA 128* 129* 123*  K 3.7 3.8 4.1  CL 94* 95* 92*  CO2 _0 BUN 15 16 22*  CREATININE 2.51* 2.70* 3.16*  GLUCOSE 94 96 116*    Electrolytes  Recent Labs Lab 11/16/15 0400 11/16/15 2125 11/17/15 0345 11/18/15 0404  CALCIUM 8.9 8.8* 9.0 8.6*  MG 1.9  --   --   --   PHOS 2.1*  --   --   --     CBC  Recent Labs Lab 11/16/15 2115 11/17/15 0345 11/18/15 0404  WBC 12.1* 13.7* 13.4*  HGB 7.7* 7.8* 7.8*  HCT 22.5* 22.9* 22.9*  PLT 72* 64* 78*    Coag's  Recent Labs Lab 11/13/2015 1827  INR 1.44    Sepsis Markers No results for input(s): LATICACIDVEN, PROCALCITON, O2SATVEN in the last 168 hours.  ABG  Recent Labs Lab 10/27/2015 1116  PHART 7.423  PCO2ART 45.2*  PO2ART 179.0*    Liver Enzymes  Recent Labs Lab 11/08/2015 1826 11/16/15  0400  AST 69* 56*  ALT 59* 36  ALKPHOS 177* 111  BILITOT 1.0 1.6*  ALBUMIN 2.0* 3.7    Cardiac Enzymes No results for input(s): TROPONINI, PROBNP in the last 168 hours.  Glucose  Recent Labs Lab 11/17/15 1148 11/17/15 1533 11/17/15 1932 11/17/15 2340 11/18/15 0346 11/18/15 0814  GLUCAP 89 98 116* 111* 81 165*    Imaging No results found.   STUDIES:  CT head 7/21>>> No acute intracranial abnormality. No intracranial mass, hemorrhage or edema. Abd u/s 7/22>>> 1. No specific explanation for transaminitis. The IVC is distended, consider passive congestion. 2. Limited study with nonvisualization of the pancreas and  left kidney.  CULTURES: Blood cx 7/21 >> Urine cx 7/21 >>  ANTIBIOTICS: Ceftaz 7/21 >> Vancomycin 7/21 >>7/23  7/23>>>  SIGNIFICANT EVENTS: 7/22 fem line placed. Right IJ unable to place d/t could not adv guidewire. Did make limitations to care   LINES/TUBES: PIV  DISCUSSION: [name] is an 21FX AAF with complicated past medical history who presents with altered mental status and anasarca. The admitting team is concerned for sepsis of unclear source. She does have new elevations of AST, ALT and AlkP, but not bilirubin but suspect that this is d/t to shock liver. I am less impressed w/ sepsis and suspect that this is much more likely failure to thrive, severe protein malnutrition and inability to tolerate HD any further. Renal is reluctant to offer CRRT and I agree as not sure this changes the end-point here if she still can't transition to traditional HD. Have presented to the daughter that should she no longer tolerate HD there may be nothing further that we can add.  ASSESSMENT / PLAN:  PULMONARY A: Hypoxemia 2/2 pulmonary edema +/- infection Favor edema 5.6 liters up P:   Continue supplemental O2 as needed for sats > 92% Continued efforts at volume removal - for HD attempt again now  CARDIOVASCULAR A:  Shock  Chronic hypotension  Paroxysmal A fib - new RVR 7/24 P:  A-line out Continue home midodrine Rate control goal HR <120 Metoprolol 2.34m IV PRN, dc  Change back to neo - worsening RVR with levophed. Neo to map goal 50 with good MS Add amio if able Stress roids Add vaso if to 200 neo or remains on pressors in am   RENAL A:   ESRD on iHD MWF P:   Continue dialysis attempts Chem in am  Not candidate CVVHD  GASTROINTESTINAL A:   Severe protein calorie malnutrition  Elevated transaminases, alk phos-->improved. Suspect that this was shock liver  [place].  Hx GERD P:  Repeat LFTs PRN and bili then  HEMATOLOGIC A:   Coagulopathy P:  Trend  INR Cbc hgb follow up SCDs for dvt ppx for now  INFECTIOUS A:   Concern for sepsis - source unclear-->no + culture to date.  P:    dc vanc Follow cultures Follow ceftaz Assess pct baseline  ENDOCRINE A:   Hx DMII Presumed rel AI P:   Accuchecks / SSI Stress roids to remain  NEUROLOGIC A:   Encephalopathy - likely toxic metabolic, seems less likely sepsis at this point  P:   RASS goal:0 Hold all sedating meds   FAMILY  - Updates: made DNR by family 7/23.  Need to continue discussions regarding goals of care.  Pt continues to deteriorate, not tolerating HD. Remains on pressors, not candidate for CVVHD.  - Inter-disciplinary family meet or Palliative Care meeting due by:  day 7  Nickolas Madrid, NP 11/18/2015  11:24 AM Pager: [phone] or [phone]  STAFF NOTE: I, Merrie Roof, MD FACP have personally reviewed patient's available data, including medical history, events of note, physical examination and test results as part of my evaluation. I have discussed with resident/NP and other care providers such as pharmacist, RN and RRT. In addition, I personally evaluated patient and elicited key findings of: confused, no fevers, lungs clear, pos 5.6 liters last 24 hours, to attempt HD again, no cvvhd able per renal, prognosis is poor, neo , add vaso if to 200 neo or remain on pressors in am for presumed relative vaso deficiency, keep stress roids, no bolus, kvo, dc vanc, maintain ceftaz The patient is critically ill with multiple organ systems failure and requires high complexity decision making for assessment and support, frequent evaluation and titration of therapies, application of advanced monitoring technologies and extensive interpretation of multiple databases.   Critical Care Time devoted to patient care services described in this note is 30 Minutes. This time reflects time of care of this signee: Merrie Roof, MD FACP. This critical care time does not  reflect procedure time, or teaching time or supervisory time of PA/NP/Med student/Med Resident etc but could involve care discussion time. Rest per NP/medical resident whose note is outlined above and that I agree with   Lavon Paganini. Titus Mould, MD, [place] Pgr: [facility] 11/18/2015 12:48 PM

## 2015-11-18 NOTE — Consult Note (Signed)
WOC Nurse wound consult note Reason for Consult: sacrum and heel Family left the room during my assessment, patient is not able to answer questions.  Wound type:  Deep Tissue Pressure Injury: left heel: 9cm x 7cm x 0cm; intact blood filled blister Stage 2 Pressure injury right buttock: 0.5cm x 0.5cm x 0.1cm; partial thickness skin injury, pink and moist Stage 2 Pressure Injury left buttock: 2.5cm x 1.5cm x 0.1cm; partial thickness skin injury, pink and moist Pressure Ulcer POA: Yes Measurement:see above Wound bed:see above Drainage (amount, consistency, odor) none from the heel; minimal serosanguinous from the sacrum Periwound: intact  Dressing procedure/placement/frequency: Continue silicone foam to the buttock wounds, change every 3 days.  Assess for wound changes every shift.  SPORT mattress in place while in the ICU, will ask that LALM mattress replacement be ordered if the patient transfers out of the ICU Add silicone foam to the left heel and add Prevalon boot for offloading.   Discussed POC with bedside nurse.  Re consult if needed, will not follow at this time. Thanks  Tomoya Ringwald [employer], RN,CNS, CWOCN [phone])

## 2015-11-18 NOTE — Progress Notes (Signed)
Family has been updated again about the information received from rounding physician this morning about their family member. Still un-decisive as to whether they want comfort care or not. Want to speak to someone tomorrow and think about it over night. Melina Schools, [place] 11/18/2015 10:59 PM

## 2015-11-18 NOTE — Progress Notes (Signed)
eLink Physician-Brief Progress Note Patient Name: [name] DOB: April 05, 1928 MRN: [id number]   Date of Service  11/18/2015  HPI/Events of Note  Na+ = 123 this AM. Most likely d/t her ESRD and inability to tolerate HD.  eICU Interventions  Her hyponatremia will need to be addressed by nephrology.     Intervention Category Major Interventions: Electrolyte abnormality - evaluation and management  [name] 11/18/2015, 5:50 AM

## 2015-11-18 NOTE — Progress Notes (Signed)
eLink Physician-Brief Progress Note Patient Name: [name] DOB: Oct 16, 1927 MRN: [id number]   Date of Service  11/18/2015  HPI/Events of Note  Hypotension - not able to start RRT.  eICU Interventions  Will order: 1. Norepinephrine IV infusion. Titrate to MAP >= 65.  2. Wean Phenylephrine IV infusion off as tolerated.      Intervention Category Major Interventions: Hypotension - evaluation and management  [name] 11/18/2015, 12:02 AM

## 2015-11-19 ENCOUNTER — Inpatient Hospital Stay (HOSPITAL_COMMUNITY): Payer: Medicare Other

## 2015-11-19 DIAGNOSIS — J80 Acute respiratory distress syndrome: Secondary | ICD-10-CM

## 2015-11-19 DIAGNOSIS — R740 Nonspecific elevation of levels of transaminase and lactic acid dehydrogenase [LDH]: Secondary | ICD-10-CM

## 2015-11-19 LAB — BASIC METABOLIC PANEL
ANION GAP: 16 — AB (ref 5–15)
BUN: 22 mg/dL — ABNORMAL HIGH (ref 6–20)
CHLORIDE: 90 mmol/L — AB (ref 101–111)
CO2: 19 mmol/L — AB (ref 22–32)
Calcium: 9.6 mg/dL (ref 8.9–10.3)
Creatinine, Ser: 3.4 mg/dL — ABNORMAL HIGH (ref 0.44–1.00)
GFR calc non Af Amer: 11 mL/min — ABNORMAL LOW (ref >60)
GFR, EST AFRICAN AMERICAN: 13 mL/min — AB (ref >60)
Glucose, Bld: 95 mg/dL (ref 65–99)
POTASSIUM: 4.3 mmol/L (ref 3.5–5.1)
SODIUM: 125 mmol/L — AB (ref 135–145)

## 2015-11-19 LAB — CBC
HEMATOCRIT: 25.1 % — AB (ref 36.0–46.0)
HEMOGLOBIN: 8.3 g/dL — AB (ref 12.0–15.0)
MCH: 30.5 pg (ref 26.0–34.0)
MCHC: 33.1 g/dL (ref 30.0–36.0)
MCV: 92.3 fL (ref 78.0–100.0)
Platelets: 85 10*3/uL — ABNORMAL LOW (ref 150–400)
RBC: 2.72 MIL/uL — AB (ref 3.87–5.11)
RDW: 24.5 % — ABNORMAL HIGH (ref 11.5–15.5)
WBC: 18.5 10*3/uL — ABNORMAL HIGH (ref 4.0–10.5)

## 2015-11-19 LAB — GLUCOSE, CAPILLARY
GLUCOSE-CAPILLARY: 102 mg/dL — AB (ref 65–99)
Glucose-Capillary: 57 mg/dL — ABNORMAL LOW (ref 65–99)
Glucose-Capillary: 112 mg/dL — ABNORMAL HIGH (ref 65–99)
Glucose-Capillary: 87 mg/dL (ref 65–99)

## 2015-11-19 LAB — PROCALCITONIN: PROCALCITONIN: 44.85 ng/mL

## 2015-11-19 LAB — HEPATITIS B SURFACE ANTIGEN: HEP B S AG: NEGATIVE

## 2015-11-19 MED ORDER — SODIUM CHLORIDE 0.9% FLUSH: 10.0000 mL | INTRAVENOUS | Status: DC | PRN

## 2015-11-19 MED ORDER — SODIUM CHLORIDE 0.9% FLUSH
10.0000 mL | Freq: Two times a day (BID) | INTRAVENOUS | Status: DC
  Administered 2015-11-19: 10 mL

## 2015-11-19 MED ORDER — DEXTROSE 5 % IV SOLN
10.0000 mg/h | INTRAVENOUS | Status: DC
  Administered 2015-11-19: 5 mg/h via INTRAVENOUS
  Filled 2015-11-19: qty 10

## 2015-11-19 MED ORDER — MORPHINE BOLUS VIA INFUSION
5.0000 mg | INTRAVENOUS | Status: DC | PRN
  Filled 2015-11-19: qty 20

## 2015-11-20 LAB — CULTURE, BLOOD (ROUTINE X 2)
CULTURE: NO GROWTH
CULTURE: NO GROWTH
Culture: NO GROWTH

## 2015-11-26 NOTE — Progress Notes (Signed)
Nutrition Brief Note  Chart reviewed due to low Braden score. Pt now transitioning to comfort care.  No nutrition interventions warranted at this time.  Please consult as needed.   Darrien Laakso, RD, LDN, CNSC Pager [phone] After Hours Pager [phone]    

## 2015-11-26 NOTE — Progress Notes (Addendum)
PULMONARY / CRITICAL CARE MEDICINE   Name: [name] MRN: [id number] DOB: 26-Sep-1927    ADMISSION DATE:  10/26/2015 CONSULTATION DATE:  11/21/2015  REFERRING MD:  Chiquita Loth  CHIEF COMPLAINT:  Altered mental status  HISTORY OF PRESENT ILLNESS:   [name] is an 82yo F w/ hx ESRD on HD MWF, a fib, HFpEF, HLD, who presented to [facility] ED from her SNF with complaints of altered mental status. She was recently admitted from 7/7-7/10 after a fall and again from 7/11-7/17 for sepsis 2/2 UTI. She had similar symptoms of altered mental status and borderline blood pressures that admission. She is chronically hypotensive, with blood pressures in [facility] 20U systolic. She was started on midodrine 10 BID last admission. She is unable to give any history today - just tells me her name and birthdate but can't say much more. She moans with any movement and cannot localize pain.   SUBJECTIVE:  Continued on Neo last night. Unable to tolerate HD. Goals of care discussion started with family.  VITAL SIGNS: BP (!) 74/20   Pulse (!) 47   Temp (!) 94.6 F (34.8 C) (Axillary)   Resp (!) 23   Ht 5' 5"  (1.651 m)   Wt 74.8 kg (164 lb 14.5 oz)   SpO2 100%   BMI 27.44 kg/m    INTAKE / OUTPUT: I/O last 3 completed shifts: In: 1828.8 [I.V.:1778.8; IV Piggyback:50] Out: 120 [Other:120]  PHYSICAL EXAMINATION:  General Elderly, Chronically ill appearing AAF, NAD, baer hugger in place  HEENT [place]/AT, dry mucous membranes  Pulmonary CTAB, diminished sounds in K[facility]lyn lung bases, Marcus in place  Cardiovascular Irregularly irregular rhythm, no murmurs  Abdomen +BS, soft, NT/ND  Musculoskeletal Grossly normal. Pitting edema of lower extremities.  Neurologic Grossly intact. No focal deficits. Does not follow commands.  Skin/Integuement No rashes or lesions    LABS:  BMET  Recent Labs Lab 11/17/15 0345 11/18/15 0404 12-03-15 0433  NA 129* 123* 125*  K 3.8 4.1 4.3  CL 95* 92* 90*  CO2 26 22 19*  BUN 16  22* 22*  CREATININE 2.70* 3.16* 3.40*  GLUCOSE 96 116* 95    Electrolytes  Recent Labs Lab 11/16/15 0400  11/17/15 0345 11/18/15 0404 03-Dec-2015 0433  CALCIUM 8.9  < > 9.0 8.6* 9.6  MG 1.9  --   --   --   --   PHOS 2.1*  --   --   --   --   < > = values in this interval not displayed.  CBC  Recent Labs Lab 11/17/15 0345 11/18/15 0404 December 03, 2015 0433  WBC 13.7* 13.4* 18.5*  HGB 7.8* 7.8* 8.3*  HCT 22.9* 22.9* 25.1*  PLT 64* 78* 85*    Coag's  Recent Labs Lab 10/30/2015 1827  INR 1.44    Sepsis Markers  Recent Labs Lab 11/18/15 1325 Dec 03, 2015 0433  PROCALCITON 42.26 44.85    ABG  Recent Labs Lab 11/25/2015 1116  PHART 7.423  PCO2ART 45.2*  PO2ART 179.0*    Liver Enzymes  Recent Labs Lab 10/26/2015 1826 11/16/15 0400  AST 69* 56*  ALT 59* 36  ALKPHOS 177* 111  BILITOT 1.0 1.6*  ALBUMIN 2.0* 3.7    Cardiac Enzymes No results for input(s): TROPONINI, PROBNP in [facility] last 168 hours.  Glucose  Recent Labs Lab 11/18/15 0346 11/18/15 0814 11/18/15 1634 11/18/15 2008 12/03/15 0012 12/03/15 0403  GLUCAP 81 165* 127* 123* 102* 112*    Imaging Dg Chest Port 1 [address]  Result Date: 12-10-15 CLINICAL DATA:  Respiratory distress. EXAM: PORTABLE CHEST 1 VIEW COMPARISON:  11/16/2015. FINDINGS: Persistent severe cardiomegaly. Diffuse bilateral pulmonary infiltrates and bilateral pleural effusions are noted. These findings are progressive from prior exam. Findings consistent with progressive congestive heart failure. Right suprahilar mass and or infiltrate cannot be excluded, follow-up PA lateral chest x-ray suggested for further evaluate. No pneumothorax. Lucency noted along K[facility]lyn right chest wall most likely related to skin fold, attention this region on subsequent follow-up exams suggested. Left axillary vascular kinked stent noted. Surgical clips right upper quadrant. IMPRESSION: 1. Cardiomegaly with progressive diffuse bilateral pulmonary infiltrates  consistent pulmonary edema. Progressive bilateral pleural effusions. 2. Right suprahilar mass and or infiltrate cannot be excluded, follow-up PA lateral chest x-ray suggested for further evaluation. Electronically Signed   By: Marcello Moores  Register   On: 12-10-15 07:10    STUDIES:  CT head 7/21>>> No acute intracranial abnormality. No intracranial mass, hemorrhage or edema. Abd u/s 7/22>>> 1. No specific explanation for transaminitis. [facility] IVC is distended, consider passive congestion. 2. Limited study with nonvisualization of [facility] pancreas and left kidney.  CULTURES: Blood cx 7/21 >> no growth Urine cx 7/21 >> no growth  ANTIBIOTICS: Ceftaz 7/21 >> Vancomycin 7/21 >>7/23;  7/23>>>  SIGNIFICANT EVENTS: 7/22 fem line placed. Right IJ unable to place d/t could not adv guidewire. Did make limitations to care  7/24 - failed HD, not a candidate further renal replacement LINES/TUBES: PIV  DISCUSSION: [name] is an 48FK AAF with complicated past medical history who presents with altered mental status and anasarca. [facility] admitting team is concerned for sepsis of unclear source. She does have new elevations of AST, ALT and AlkP, but not bilirubin but suspect that this is d/t to shock liver. I am less impressed w/ sepsis and suspect that this is much more likely failure to thrive, severe protein malnutrition and inability to tolerate HD any further. Renal is reluctant to offer CRRT and I agree as not sure this changes [facility] end-point here if she still can't transition to traditional HD. Have presented to [facility] daughter that should she no longer tolerate HD there may be nothing further that we can add.  ASSESSMENT / PLAN:  PULMONARY A: Hypoxemia 2/2 pulmonary edema +/- infection Volume overloaded and unable to tolerate HD. effusions P:   Continue supplemental O2 as needed for sats > 92%  Requires comfort Increasing O2 futile  CARDIOVASCULAR A:  Shock  Chronic hypotension  Paroxysmal A fib  - new RVR 7/24 Episodes of bradycardia P:  MAP goal 55 with neo -  max at 200 No role adding other pressors, would be medically ineffective Continue home midodrine Rate control goal HR <120 Stress dose steroids maintain  RENAL A:   ESRD on HD MWF- unable to tolerate HD P:   Not a candidate for CVVHD per nephro  GASTROINTESTINAL A:   Severe protein calorie malnutrition  Elevated transaminases, alk phos-->improved. Suspect that this was shock liver. [place] negative.  Hx GERD P:  Repeat LFTs PRN  Place NGT and feeding also would be futile and painful  HEMATOLOGIC A:   Coagulopathy P:  SCDs for dvt ppx for now  INFECTIOUS A:   Concern for sepsis - source unclear-->no + culture to date.  R/o bacteremia related to hD R/o urine P:   Follow cultures Continue Ceftaz PCT dc, some rise from esrd and appears flat to some degree Unclear source, [place] neg hydro no abscess consider dc hd cath groiin  ENDOCRINE A:   Hx DMII Presumed rel AI P:   Accuchecks / SSI Stress steroids  NEUROLOGIC A:   Encephalopathy - likely toxic metabolic, seems less likely sepsis at this point  Appears to be in pain and sufering P:   RASS goal:0 Hold all sedating meds   Hyman Bible, MD Family Medicine, PGY-2 12/06/2015  7:19 AM   STAFF NOTE: Linwood Dibbles, MD FACP have personally reviewed Gonzales's available data, including medical history, events of note, physical examination and test results as part of my evaluation. I have discussed with resident/NP and other care providers such as pharmacist, RN and RRT. In addition, I personally evaluated Gonzales and elicited key findings of: lethargic unresponsive, increase RR, crackles, she is actively dying and is no longer a candidate for HD, I will speak to family now about full comfort, aggressive care,. Pressors increase etc would be futile and medically inefective and allow her to suffer, need morphine drip immediately, mantina ceftaz, concern  is originaklly is HD related bacteremia, vs urine, neg [place] study for absess, need to transition to full comfort, max neo 200, no role additional pressors [name] is critically ill with multiple organ systems failure and requires high complexity decision making for assessment and support, frequent evaluation and titration of therapies, application of advanced monitoring technologies and extensive interpretation of multiple databases.   Critical Care Time devoted to Gonzales care services described in this note is35 Minutes. This time reflects time of care of this signee: Merrie Roof, MD FACP. This critical care time does not reflect procedure time, or teaching time or supervisory time of PA/NP/Med student/Med Resident etc but could involve care discussion time. Rest per NP/medical resident whose note is outlined above and that I agree with   Lavon Paganini. Titus Mould, MD, FACP Pgr: [facility] 12-06-2015 10:36 AM   Extensive discussion with family daughter and son. We discussed [facility] poor prognosis and likely poor quality of life. Family has decided to offer full comfort care. They are aware that [name] may be transferred to palliative care floor for continued comfort care needs. They have been fully updated on [facility] process and expectations.

## 2015-11-26 NOTE — Progress Notes (Signed)
Called back nurse for report.

## 2015-11-26 NOTE — Progress Notes (Signed)
   28-Nov-2015 1300  Clinical Encounter Type  Visited With Patient and family together;Health care provider  Visit Type Psychological support;Spiritual support;Social support;Patient actively dying  Referral From Nurse  Consult/Referral To Chaplain;Faith community  Spiritual Encounters  Spiritual Needs Literature;Prayer;Ritual;Emotional;Grief support  Stress Factors  Patient Stress Factors None identified  Family Stress Factors Loss  Advance Directives (For Healthcare)  Does patient have an advance directive? Yes  Type of Advance Directive Healthcare Power of [name] had a lovely time with family. family shared stories about Pt. Chaplain provided spiritual care via prayer. Please page Chaplain it seems as though family needs extra support.

## 2015-11-26 NOTE — Care Management Important Message (Signed)
Important Message  Patient Details  Name: [name] MRN: [id number] Date of Birth: 1927/11/29   Medicare Important Message Given:  Yes    Masao Junker Abena 11/04/2015, 10:31 AM

## 2015-11-26 NOTE — Progress Notes (Signed)
Pharmacy Antibiotic Note  [name] is a 82 y.o. female admitted on 12-14-2015 with sepsis.  Pt has history of PCN allergy but has tolerated cephalosporins in the past. Pt has ESRD on HD (MWF) and was sent straight from HD secondary to hypotension and AMS. All culture data is negative. Today is day #5 of abx.   Plan: Ceftazidime 2g IV qHD Monitor culture data, HD plan and clinical course   Height: 5\' 5"  (165.1 cm) Weight: 164 lb 14.5 oz (74.8 kg) IBW/kg (Calculated) : 57  Temp (24hrs), Avg:97.1 F (36.2 C), Min:94.6 F (34.8 C), Max:98 F (36.7 C)   Recent Labs Lab 11/16/15 0400 11/16/15 0638 11/16/15 2115 11/16/15 2125 11/17/15 0345 11/18/15 0404 11/13/2015 0433  WBC 10.8* 10.6* 12.1*  --  13.7* 13.4* 18.5*  CREATININE 2.15*  --   --  2.51* 2.70* 3.16* 3.40*    Estimated Creatinine Clearance: 12.2 mL/min (by C-G formula based on SCr of 3.4 mg/dL).    Allergies  Allergen Reactions  . Sulfa Antibiotics Anaphylaxis, Itching and Rash  . Aminophylline Itching  . Other Itching    Sometimes reacts to binders in generic drugs - can be treated with claritin  . Clarithromycin Itching and Rash  . Ezetimibe-Simvastatin Rash  . Penicillins Rash    Has patient had a PCN reaction causing immediate rash, facial/tongue/throat swelling, SOB or lightheadedness with hypotension: Yes Has patient had a PCN reaction causing severe rash involving mucus membranes or skin necrosis: No Has patient had a PCN reaction that required hospitalization unknown Has patient had a PCN reaction occurring within the last 10 years: No If all of the above answers are "NO", then may proceed with Cephalosporin use.  Marland Kitchen Propoxyphene Itching  . Rofecoxib Rash    Antimicrobials this admission: Vanc 7/21 >> 7/24 Ceftaz 7/21 >>   Dose adjustments this admission: n/a  Microbiology results: 7/21 BCx: sent 7/21 UCx: sent   Sputum:    MRSA PCR:      Agapito Games, PharmD, BCPS Clinical  Pharmacist 10/31/2015 10:18 AM

## 2015-11-26 NOTE — Progress Notes (Signed)
  [facility] Progress Note   Subjective: lethargic, hypotensive, poorly responsive  Vitals:   11/09/2015 0530 11/20/2015 0545 11/22/2015 0600 10/29/2015 0831  BP: (!) 83/53 (!) 74/20    Pulse:      Resp: 19 (!) 21 (!) 23   Temp:    97.6 F (36.4 C)  TempSrc:    Axillary  SpO2:      Weight:      Height:        Inpatient medications: . sodium chloride   Intravenous Once  . cefTAZidime (FORTAZ)  IV  2 g Intravenous Q M,W,F-HD  . heparin  5,000 Units Subcutaneous Q8H  . hydrocortisone sod succinate (SOLU-CORTEF) inj  50 mg Intravenous Q6H  . midodrine  10 mg Oral TID WC  . sodium chloride flush  10-40 mL Intracatheter Q12H  . sodium chloride flush  3 mL Intravenous Q12H   . morphine    . phenylephrine (NEO-SYNEPHRINE) Adult infusion 140 mcg/min (10/28/2015 0732)   sodium chloride, sodium chloride, sodium chloride, albumin human, alteplase, heparin, heparin, lidocaine (PF), lidocaine-prilocaine, morphine injection, morphine, pentafluoroprop-tetrafluoroeth, sodium chloride flush  Exam: Gen lethargic, moaning, not responding verbally CVS: Pulse regular, S1 and S2 with ejection systolic murmur Resp: Coarse breath sounds-poor inspiratory effort bilaterally Abd: Soft, obese, nontender Ext: diffuse 2-3+ LE and UE edema Neuro: confused, severe debility  Dialysis: [facility] MWF  3.5h  F160  64kg   3K/2.25 bath  P5  L thigh AVG  Hep 1000 Last Mircera 6.28 100ug      Assessment: 1  Hypotension/ shock, refractory 2  Afib / RVR 3  FTT 4  ESRD on HD 5  DNR 6  Debility, chronic, severe   Plan - didn't tolerate HD yesterday, not candidate for further RRT.  Have d/w primary , agree w comfort care.     Vinson Moselle MD [facility] pager [phone]    cell [phone] 11/09/2015, 11:59 AM    Recent Labs Lab 11/16/15 0400  11/17/15 0345 11/18/15 0404 10/30/2015 0433  NA 134*  < > 129* 123* 125*  K 3.4*  < > 3.8 4.1 4.3  CL 98*  < > 95* 92* 90*  CO2 27  < > 26  22 19*  GLUCOSE 173*  < > 96 116* 95  BUN 13  < > 16 22* 22*  CREATININE 2.15*  < > 2.70* 3.16* 3.40*  CALCIUM 8.9  < > 9.0 8.6* 9.6  PHOS 2.1*  --   --   --   --   < > = values in this interval not displayed.  Recent Labs Lab 2015/12/09 1826 11/16/15 0400  AST 69* 56*  ALT 59* 36  ALKPHOS 177* 111  BILITOT 1.0 1.6*  PROT 4.4* 5.2*  ALBUMIN 2.0* 3.7    Recent Labs Lab 12-09-15 1826  11/17/15 0345 11/18/15 0404 11/24/2015 0433  WBC 12.1*  < > 13.7* 13.4* 18.5*  NEUTROABS 10.0*  --   --   --   --   HGB 9.5*  < > 7.8* 7.8* 8.3*  HCT 28.8*  < > 22.9* 22.9* 25.1*  MCV 89.4  < > 89.8 90.2 92.3  PLT 93*  < > 64* 78* 85*  < > = values in this interval not displayed. Iron/TIBC/Ferritin/ %Sat    Component Value Date/Time   IRON 49 12/28/2008 0525   TIBC 172 (L) 12/28/2008 0525   FERRITIN 201 12/27/2008 0549   IRONPCTSAT 28 12/28/2008 0525

## 2015-11-26 NOTE — Progress Notes (Signed)
Pt. Arrived to the floor around 1840. Pt. Was not breathing when she arrived to the floor. I verified pt.'s death with Wyn Quaker, RN. I notified my charge nurse of situation. Gordy Councilman stated that she called [employer] and took care of all of that information. Attending Physician notified. Post-mortem checklist started and report passed on to night shift RN.

## 2015-11-26 NOTE — Progress Notes (Signed)
Report given to Lauren, receiving RN. Family directed to waiting room for [facility]. Patient moved from ICU bed to bed from 6N patient vital signs unchanged at this time as patient still attached to monitor. Patient removed from monitor for transport. Patient transferred on morphine drip. Patient noted to have spontaneous respirations upon leaving and in elevator up to the 6th floor as well as through hallway to [employer]. Upon arrival to patients room patient noted to not have spontaneous respirations, pupils fixed and dilated, no heart sounds heard. Time of death 1635/08/28, verified by myself, Arnoldo Lenis RN and Fonnie Mu RN. Elink notified. Family brought to bedside and informed, family is coping well at this time.

## 2015-11-26 NOTE — Progress Notes (Signed)
eLink Physician-Brief Progress Note Patient Name: [name] DOB: 1927-05-16 MRN: [id number]   Date of Service  2015/12/10  HPI/Events of Note  Hypotension - BP = 80/56 on Phenylephrine IV infusion at 120 mcg/min. Ceiling 200 mcg/min.  eICU Interventions  Will increase Phenylephrine IV infusion ceiling to 400 mcg/min. However, I doubt that she will need to go above the current 200 mcg/min ceiling to achieve hemodynamic stability.     Intervention Category Intermediate Interventions: Hypotension - evaluation and management  Sommer,Steven Eugene 12-10-15, 3:07 AM

## 2015-11-26 NOTE — Progress Notes (Signed)
17:20 report attempted x1 direct number provided to call back when ready for report.

## 2015-11-26 NOTE — Progress Notes (Signed)
   11-26-15 1400  Clinical Encounter Type  Visited With Patient and family together  Visit Type Follow-up  Spiritual Encounters  Spiritual Needs Prayer;Emotional;Grief support  Stress Factors  Patient Stress Factors Exhausted;Health changes  Family Stress Factors Loss   Chaplain visited with patient and family.  Family present with love and support for patient.  Chaplain offered prayer with family and patient.  Chaplain offered ministry of presence to family.  [name] Glad 11/26/2015 2:27 PM

## 2015-11-26 NOTE — Progress Notes (Signed)
Patient's HR is dropping to the 30s. BP is up and down. Family and Elink aware. Will continue to monitor. Suzy Bouchard E, [place] 10/31/2015 2:54 AM

## 2015-11-26 NOTE — Progress Notes (Signed)
Family meeting took place this morning, patient was made comfort care and placed on morphine drip. Family at bedside, chaplain services offered to family. Comfort cart at bedside. Patient appears comfortable at this time. Will reassess comfort level frequently. No needs at this time. Will continue to monitor.

## 2015-11-26 DEATH — deceased

## 2015-12-05 ENCOUNTER — Telehealth: Payer: Self-pay

## 2015-12-05 NOTE — Telephone Encounter (Signed)
On 12/05/2015 I [facility] The death certificate is for burial. The patient is a patient of Doctor Tyson AliasFeinstein. [facility].  On 12/06/2015 I received the death certificate back from Doctor Tyson AliasFeinstein. I got the death certificate ready and called the funeral home to let them know the death certificate is ready for pickup.

## 2015-12-27 NOTE — Discharge Summary (Signed)
NAMEMarland Gonzales  [name], [name] NO.:  [id number]  MEDICAL RECORD NO.:  [id number]  LOCATION:  [id number]                        FACILITY:  [facility]  PHYSICIAN:  Nelda Bucks, MD DATE OF BIRTH:  June 27, 1927  DATE OF ADMISSION:  11/16/2015 DATE OF DISCHARGE:  2015-11-23                              DISCHARGE SUMMARY   This is an 82 year old with a history of end-stage renal disease, on dialysis Monday, Wednesday, and Friday, atrial fibrillation, heart failure, and hyperlipidemia presented to the emergency room from a nursing home complaining of altered mental status.  She had a recent multiple admissions on July 7 and July 10 after a fall and again on July 11 to July 17 for sepsis secondary to urinary tract infection.  At that time, she had similar mental status changes and borderline blood pressure.  She was noted to have chronic hypotension.  She was started on midodrine 10 mg b.i.d.  she had come in on this admission for the same, which is hypotension.  They had difficulty dialyzing her.  They were treating her for sepsis with source of the urine versus a bacteremia related HD she had been having a head CT on July 21 no acute intracranial process.  Abdominal ultrasound on July 27.  No specific explanation from her liver function.  Elevation blood cultures were negative.  She was maintained on aggressive antibiotics with ceftazidime and vancomycin for nosocomial exposure.  Essentially patient was failing to thrive.  Was not responding to therapy she felt intermittent dialysis.  She was not a candidate for CVVHD.  She was not handling any form of volume removal.  She was actively dying and suffering through this.  I had multiple discussion with the family members who have actually considered full comfort care.  Comfort measures were given and the patient expired.  FINAL DIAGNOSES UPON DEATH: 1. Hypotension most likely related to sepsis. 2. End-stage renal disease, with  hypotension and not allowing     intermittent dialysis or CVVHD. 3. End-stage renal disease. 4. Heart failure.     Nelda Bucks, MD     DJF/MEDQ  D:  11/29/2015  T:  11/30/2015  Job:  [id number]

## 2018-05-17 IMAGING — CR DG CHEST 1V PORT
1 series · 1 of 1 positions shown · non-contrast
Comparison: 11/15/2015.

CLINICAL DATA: Altered mental status.

EXAM:
PORTABLE CHEST 1 VIEW

[AP]
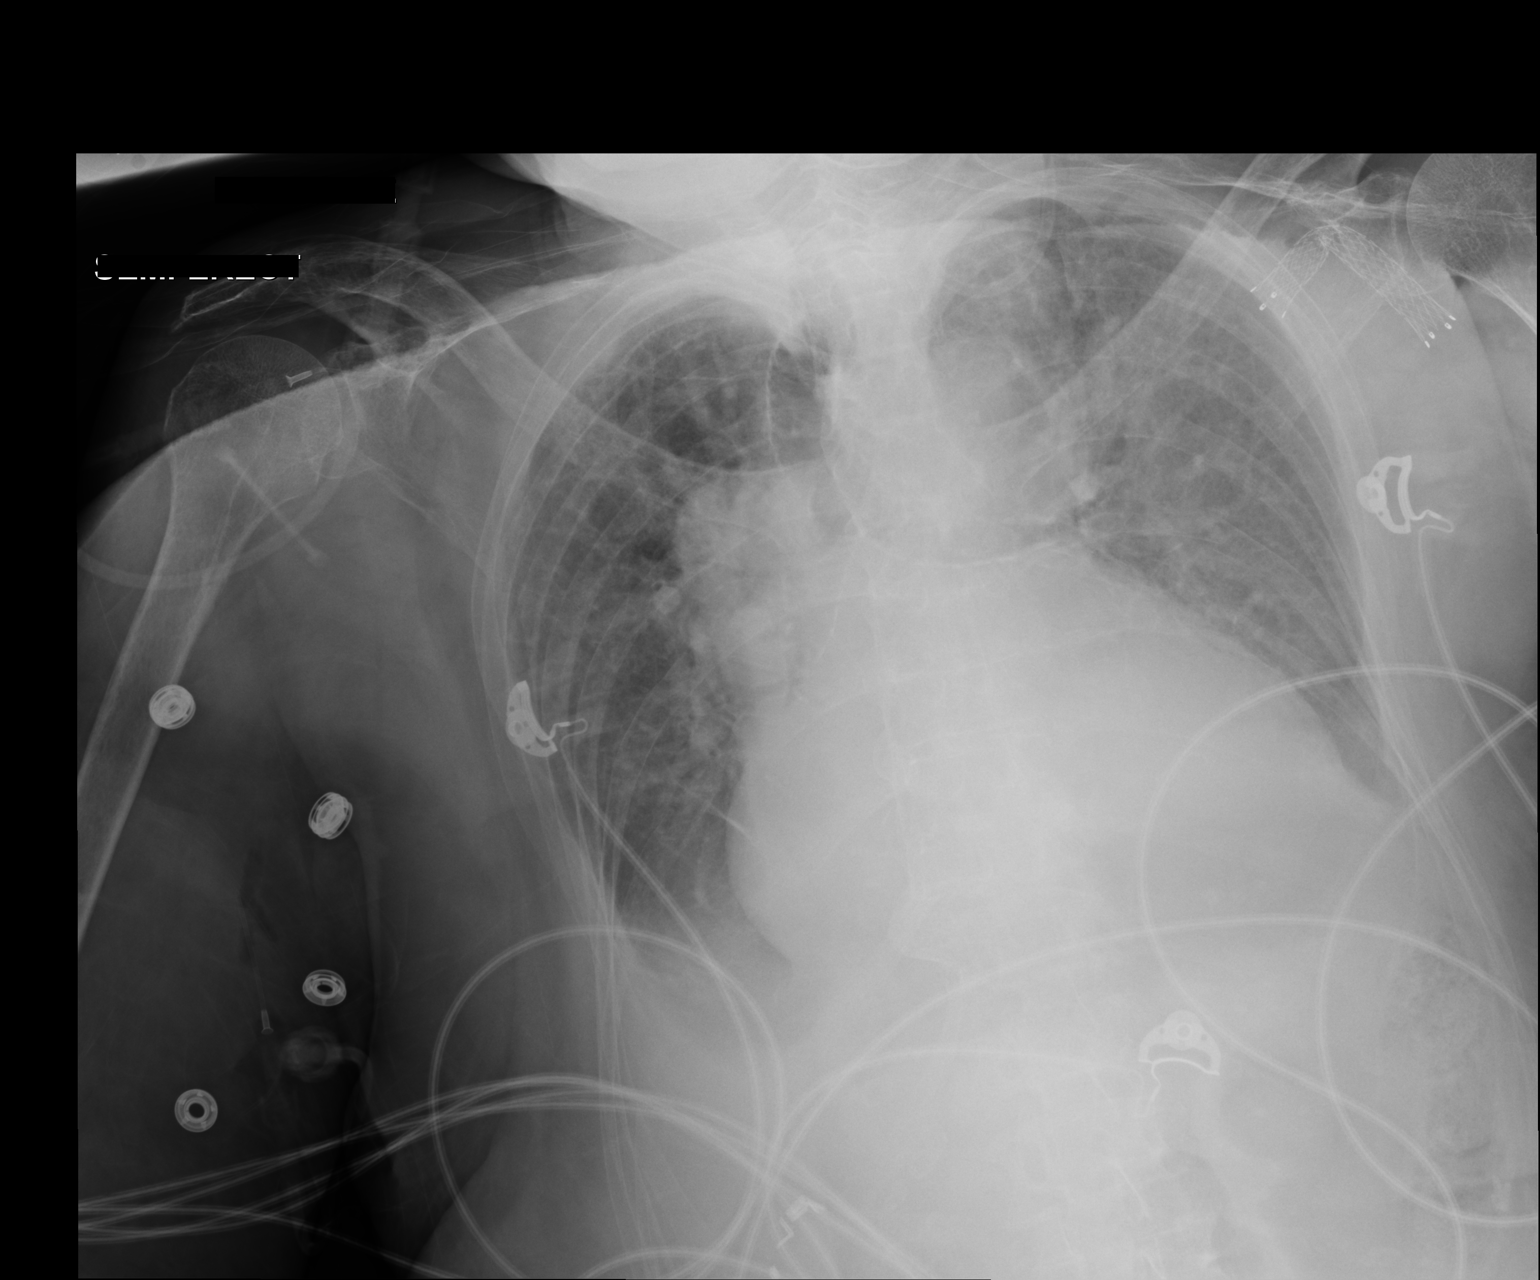

[1 of 1 positions shown; findings below may reference images not displayed]

FINDINGS: Patient is rotated. Heart is enlarged. Suspect collapse/
consolidation in the right middle lobe and left lower lobe. Layering
pleural effusions are seen bilaterally. Mild interstitial
prominence. Vascular stent is seen in the left axillary region.
IMPRESSION: 1. Moderate layering bilateral pleural effusions. Difficult to
exclude edema and therefore congestive heart failure.
2. Collapse/consolidation in the right middle and left lower lobes.

## 2018-05-20 IMAGING — CR DG CHEST 1V PORT
1 series · 1 of 1 positions shown · non-contrast
Comparison: 11/16/2015.

CLINICAL DATA: Respiratory distress.

EXAM:
PORTABLE CHEST 1 VIEW

[AP]
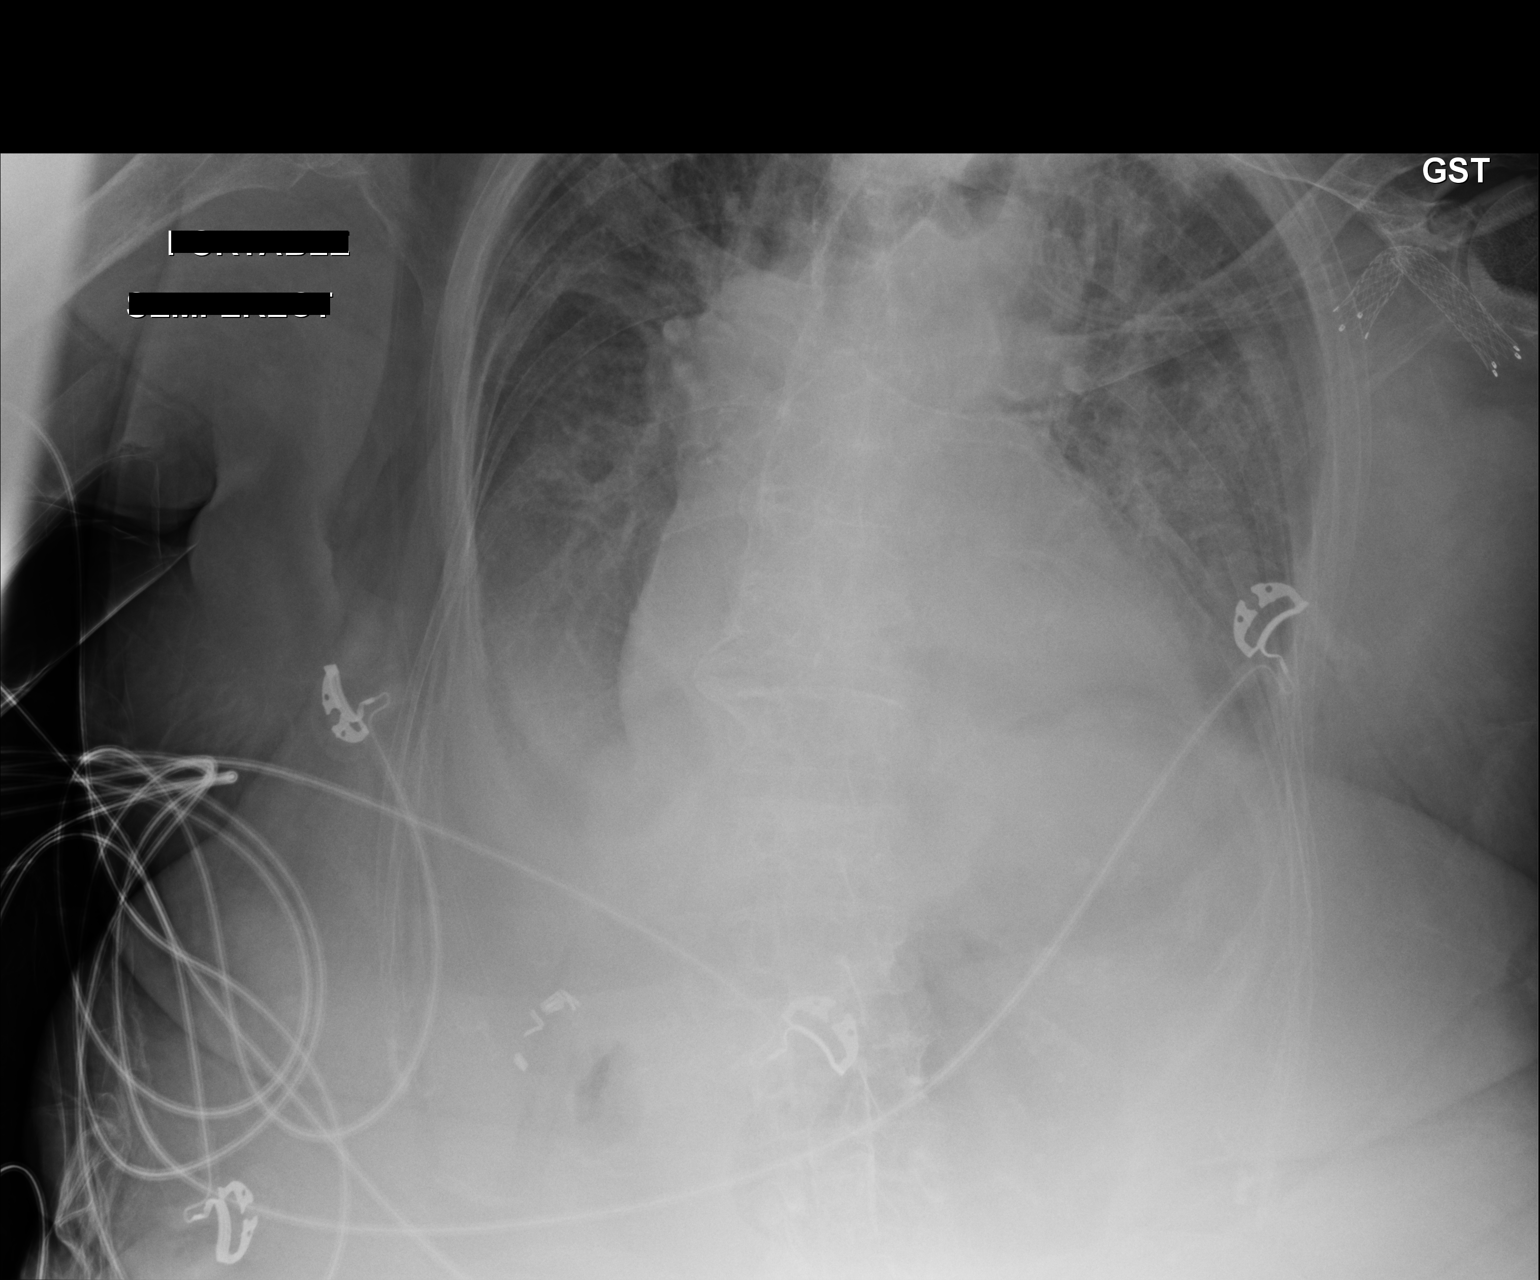

[1 of 1 positions shown; findings below may reference images not displayed]

FINDINGS: Persistent severe cardiomegaly. Diffuse bilateral pulmonary
infiltrates and bilateral pleural effusions are noted. These
findings are progressive from prior exam. Findings consistent with
progressive congestive heart failure. Right suprahilar mass and or
infiltrate cannot be excluded, follow-up PA lateral chest x-ray
suggested for further evaluate. No pneumothorax. Lucency noted along
the right chest wall most likely related to skin fold, attention
this region on subsequent follow-up exams suggested. Left axillary
vascular kinked stent noted. Surgical clips right upper quadrant.
IMPRESSION: 1. Cardiomegaly with progressive diffuse bilateral pulmonary
infiltrates consistent pulmonary edema. Progressive bilateral
pleural effusions.

2. Right suprahilar mass and or infiltrate cannot be excluded,
follow-up PA lateral chest x-ray suggested for further evaluation.
